# Patient Record
Sex: Female | Born: 1950 | Race: White | Hispanic: No | Marital: Single | Smoking: Never smoker
Health system: Southern US, Community
[De-identification: ages and names within clinical notes are randomized; demographics above are authoritative.]

## PROBLEM LIST (undated history)

## (undated) DIAGNOSIS — N289 Disorder of kidney and ureter, unspecified: Secondary | ICD-10-CM

## (undated) DIAGNOSIS — J45909 Unspecified asthma, uncomplicated: Secondary | ICD-10-CM

## (undated) DIAGNOSIS — I10 Essential (primary) hypertension: Secondary | ICD-10-CM

## (undated) HISTORY — PX: NEPHRECTOMY TRANSPLANTED ORGAN: SUR880

---

## 2011-02-12 ENCOUNTER — Other Ambulatory Visit (HOSPITAL_COMMUNITY): Payer: Self-pay | Admitting: Nephrology

## 2011-02-12 DIAGNOSIS — N186 End stage renal disease: Secondary | ICD-10-CM

## 2011-02-15 ENCOUNTER — Other Ambulatory Visit (HOSPITAL_COMMUNITY): Payer: Self-pay | Admitting: Nephrology

## 2011-02-15 ENCOUNTER — Ambulatory Visit (HOSPITAL_COMMUNITY)
Admission: RE | Admit: 2011-02-15 | Discharge: 2011-02-15 | Disposition: A | Discharge status: Home / Self Care | Payer: Non-veteran care | Source: Ambulatory Visit | Attending: Nephrology | Admitting: Nephrology

## 2011-02-15 DIAGNOSIS — N2581 Secondary hyperparathyroidism of renal origin: Secondary | ICD-10-CM | POA: Insufficient documentation

## 2011-02-15 DIAGNOSIS — N186 End stage renal disease: Secondary | ICD-10-CM | POA: Insufficient documentation

## 2011-02-15 DIAGNOSIS — I120 Hypertensive chronic kidney disease with stage 5 chronic kidney disease or end stage renal disease: Secondary | ICD-10-CM | POA: Insufficient documentation

## 2011-02-15 DIAGNOSIS — D649 Anemia, unspecified: Secondary | ICD-10-CM | POA: Insufficient documentation

## 2011-02-15 DIAGNOSIS — M109 Gout, unspecified: Secondary | ICD-10-CM | POA: Insufficient documentation

## 2011-02-15 DIAGNOSIS — Z01812 Encounter for preprocedural laboratory examination: Secondary | ICD-10-CM | POA: Insufficient documentation

## 2011-02-15 LAB — BASIC METABOLIC PANEL
BUN: 62 mg/dL — ABNORMAL HIGH (ref 6–23)
CO2: 24 mEq/L (ref 19–32)
Chloride: 105 mEq/L (ref 96–112)
Glucose, Bld: 99 mg/dL (ref 70–99)
Potassium: 4 mEq/L (ref 3.5–5.1)

## 2011-02-15 LAB — CBC
HCT: 31.3 % — ABNORMAL LOW (ref 36.0–46.0)
Hemoglobin: 10.5 g/dL — ABNORMAL LOW (ref 12.0–15.0)
WBC: 10.3 10*3/uL (ref 4.0–10.5)

## 2011-02-15 MED ORDER — IOHEXOL 300 MG/ML  SOLN
50.0000 mL | Freq: Once | INTRAMUSCULAR | Status: AC | PRN
  Administered 2011-02-15: 25 mL via INTRAVENOUS

## 2011-02-26 ENCOUNTER — Other Ambulatory Visit (HOSPITAL_COMMUNITY): Payer: Self-pay | Admitting: Nephrology

## 2011-02-26 DIAGNOSIS — N186 End stage renal disease: Secondary | ICD-10-CM

## 2011-03-02 ENCOUNTER — Other Ambulatory Visit (HOSPITAL_COMMUNITY): Payer: Self-pay | Admitting: Nephrology

## 2011-03-02 DIAGNOSIS — N186 End stage renal disease: Secondary | ICD-10-CM

## 2011-03-03 ENCOUNTER — Other Ambulatory Visit (HOSPITAL_COMMUNITY): Payer: Self-pay | Admitting: Nephrology

## 2011-03-03 DIAGNOSIS — N186 End stage renal disease: Secondary | ICD-10-CM

## 2011-03-15 ENCOUNTER — Ambulatory Visit (HOSPITAL_COMMUNITY): Payer: Self-pay

## 2011-03-22 ENCOUNTER — Ambulatory Visit (HOSPITAL_COMMUNITY)
Admission: RE | Admit: 2011-03-22 | Discharge: 2011-03-22 | Disposition: A | Discharge status: Home / Self Care | Payer: Non-veteran care | Source: Ambulatory Visit | Attending: Nephrology | Admitting: Nephrology

## 2011-03-22 DIAGNOSIS — T82898A Other specified complication of vascular prosthetic devices, implants and grafts, initial encounter: Secondary | ICD-10-CM | POA: Insufficient documentation

## 2011-03-22 DIAGNOSIS — N186 End stage renal disease: Secondary | ICD-10-CM | POA: Insufficient documentation

## 2011-03-22 DIAGNOSIS — Y832 Surgical operation with anastomosis, bypass or graft as the cause of abnormal reaction of the patient, or of later complication, without mention of misadventure at the time of the procedure: Secondary | ICD-10-CM | POA: Insufficient documentation

## 2011-03-22 MED ORDER — IOHEXOL 300 MG/ML  SOLN
100.0000 mL | Freq: Once | INTRAMUSCULAR | Status: AC | PRN
  Administered 2011-03-22: 21 mL via INTRAVENOUS

## 2015-03-12 ENCOUNTER — Encounter (HOSPITAL_COMMUNITY): Payer: Self-pay | Admitting: Emergency Medicine

## 2015-03-12 ENCOUNTER — Emergency Department (HOSPITAL_COMMUNITY): Payer: No Typology Code available for payment source

## 2015-03-12 ENCOUNTER — Emergency Department (HOSPITAL_COMMUNITY)
Admission: EM | Admit: 2015-03-12 | Discharge: 2015-03-12 | Disposition: A | Discharge status: Home / Self Care | Payer: No Typology Code available for payment source | Attending: Emergency Medicine | Admitting: Emergency Medicine

## 2015-03-12 DIAGNOSIS — I10 Essential (primary) hypertension: Secondary | ICD-10-CM | POA: Diagnosis not present

## 2015-03-12 DIAGNOSIS — J45909 Unspecified asthma, uncomplicated: Secondary | ICD-10-CM | POA: Diagnosis not present

## 2015-03-12 DIAGNOSIS — M5442 Lumbago with sciatica, left side: Secondary | ICD-10-CM

## 2015-03-12 DIAGNOSIS — Y998 Other external cause status: Secondary | ICD-10-CM | POA: Insufficient documentation

## 2015-03-12 DIAGNOSIS — S79912A Unspecified injury of left hip, initial encounter: Secondary | ICD-10-CM | POA: Insufficient documentation

## 2015-03-12 DIAGNOSIS — Z87448 Personal history of other diseases of urinary system: Secondary | ICD-10-CM | POA: Insufficient documentation

## 2015-03-12 DIAGNOSIS — Y92410 Unspecified street and highway as the place of occurrence of the external cause: Secondary | ICD-10-CM | POA: Insufficient documentation

## 2015-03-12 DIAGNOSIS — Z940 Kidney transplant status: Secondary | ICD-10-CM | POA: Diagnosis not present

## 2015-03-12 DIAGNOSIS — S79911A Unspecified injury of right hip, initial encounter: Secondary | ICD-10-CM | POA: Diagnosis not present

## 2015-03-12 DIAGNOSIS — Y9389 Activity, other specified: Secondary | ICD-10-CM | POA: Diagnosis not present

## 2015-03-12 DIAGNOSIS — M25552 Pain in left hip: Secondary | ICD-10-CM

## 2015-03-12 DIAGNOSIS — S3992XA Unspecified injury of lower back, initial encounter: Secondary | ICD-10-CM | POA: Diagnosis present

## 2015-03-12 DIAGNOSIS — M25551 Pain in right hip: Secondary | ICD-10-CM

## 2015-03-12 HISTORY — DX: Unspecified asthma, uncomplicated: J45.909

## 2015-03-12 HISTORY — DX: Disorder of kidney and ureter, unspecified: N28.9

## 2015-03-12 HISTORY — DX: Essential (primary) hypertension: I10

## 2015-03-12 LAB — I-STAT CHEM 8, ED
BUN: 9 mg/dL (ref 6–20)
CALCIUM ION: 1.16 mmol/L (ref 1.13–1.30)
CHLORIDE: 104 mmol/L (ref 101–111)
Creatinine, Ser: 0.9 mg/dL (ref 0.44–1.00)
GLUCOSE: 147 mg/dL — AB (ref 65–99)
HEMATOCRIT: 50 % — AB (ref 36.0–46.0)
Hemoglobin: 17 g/dL — ABNORMAL HIGH (ref 12.0–15.0)
POTASSIUM: 4.1 mmol/L (ref 3.5–5.1)
Sodium: 141 mmol/L (ref 135–145)
TCO2: 23 mmol/L (ref 0–100)

## 2015-03-12 MED ORDER — DIAZEPAM 5 MG PO TABS: 5.0000 mg | ORAL_TABLET | Freq: Two times a day (BID) | ORAL | Status: DC | PRN

## 2015-03-12 MED ORDER — HYDROCODONE-ACETAMINOPHEN 5-325 MG PO TABS: 2.0000 | ORAL_TABLET | ORAL | Status: DC | PRN

## 2015-03-12 NOTE — Discharge Instructions (Signed)
Back Pain, Adult °Low back pain is very common. About 1 in 5 people have back pain. The cause of low back pain is rarely dangerous. The pain often gets better over time. About half of people with a sudden onset of back pain feel better in just 2 weeks. About 8 in 10 people feel better by 6 weeks.  °CAUSES °Some common causes of back pain include: °· Strain of the muscles or ligaments supporting the spine. °· Wear and tear (degeneration) of the spinal discs. °· Arthritis. °· Direct injury to the back. °DIAGNOSIS °Most of the time, the direct cause of low back pain is not known. However, back pain can be treated effectively even when the exact cause of the pain is unknown. Answering your caregiver's questions about your overall health and symptoms is one of the most accurate ways to make sure the cause of your pain is not dangerous. If your caregiver needs more information, he or she may order lab work or imaging tests (X-rays or MRIs). However, even if imaging tests show changes in your back, this usually does not require surgery. °HOME CARE INSTRUCTIONS °For many people, back pain returns. Since low back pain is rarely dangerous, it is often a condition that people can learn to manage on their own.  °· Remain active. It is stressful on the back to sit or stand in one place. Do not sit, drive, or stand in one place for more than 30 minutes at a time. Take short walks on level surfaces as soon as pain allows. Try to increase the length of time you walk each day. °· Do not stay in bed. Resting more than 1 or 2 days can delay your recovery. °· Do not avoid exercise or work. Your body is made to move. It is not dangerous to be active, even though your back may hurt. Your back will likely heal faster if you return to being active before your pain is gone. °· Pay attention to your body when you  bend and lift. Many people have less discomfort when lifting if they bend their knees, keep the load close to their bodies, and  avoid twisting. Often, the most comfortable positions are those that put less stress on your recovering back. °· Find a comfortable position to sleep. Use a firm mattress and lie on your side with your knees slightly bent. If you lie on your back, put a pillow under your knees. °· Only take over-the-counter or prescription medicines as directed by your caregiver. Over-the-counter medicines to reduce pain and inflammation are often the most helpful. Your caregiver may prescribe muscle relaxant drugs. These medicines help dull your pain so you can more quickly return to your normal activities and healthy exercise. °· Put ice on the injured area. °· Put ice in a plastic bag. °· Place a towel between your skin and the bag. °· Leave the ice on for 15-20 minutes, 03-04 times a day for the first 2 to 3 days. After that, ice and heat may be alternated to reduce pain and spasms. °· Ask your caregiver about trying back exercises and gentle massage. This may be of some benefit. °· Avoid feeling anxious or stressed. Stress increases muscle tension and can worsen back pain. It is important to recognize when you are anxious or stressed and learn ways to manage it. Exercise is a great option. °SEEK MEDICAL CARE IF: °· You have pain that is not relieved with rest or medicine. °· You have pain that does not improve in 1 week. °· You have new symptoms. °· You are generally not feeling well. °SEEK   IMMEDIATE MEDICAL CARE IF:  °· You have pain that radiates from your back into your legs. °· You develop new bowel or bladder control problems. °· You have unusual weakness or numbness in your arms or legs. °· You develop nausea or vomiting. °· You develop abdominal pain. °· You feel faint. °Document Released: 08/23/2005 Document Revised: 02/22/2012 Document Reviewed: 12/25/2013 °ExitCare® Patient Information ©2015 [employer]. This information is not intended to replace advice given to you by your health care provider. Make sure you  discuss any questions you have with your health care provider. ° °Motor Vehicle Collision °It is common to have multiple bruises and sore muscles after a motor vehicle collision (MVC). These tend to feel worse for the first 24 hours. You may have the most stiffness and soreness over the first several hours. You may also feel worse when you wake up the first morning after your collision. After this point, you will usually begin to improve with each day. The speed of improvement often depends on the severity of the collision, the number of injuries, and the location and nature of these injuries. °HOME CARE INSTRUCTIONS °· Put ice on the injured area. °¨ Put ice in a plastic bag. °¨ Place a towel between your skin and the bag. °¨ Leave the ice on for 15-20 minutes, 3-4 times a day, or as directed by your health care provider. °· Drink enough fluids to keep your urine clear or pale yellow. Do not drink alcohol. °· Take a warm shower or bath once or twice a day. This will increase blood flow to sore muscles. °· You may return to activities as directed by your caregiver. Be careful when lifting, as this may aggravate neck or back pain. °· Only take over-the-counter or prescription medicines for pain, discomfort, or fever as directed by your caregiver. Do not use aspirin. This may increase bruising and bleeding. °SEEK IMMEDIATE MEDICAL CARE IF: °· You have numbness, tingling, or weakness in the arms or legs. °· You develop severe headaches not relieved with medicine. °· You have severe neck pain, especially tenderness in the middle of the back of your neck. °· You have changes in bowel or bladder control. °· There is increasing pain in any area of the body. °· You have shortness of breath, light-headedness, dizziness, or fainting. °· You have chest pain. °· You feel sick to your stomach (nauseous), throw up (vomit), or sweat. °· You have increasing abdominal discomfort. °· There is blood in your urine, stool, or  vomit. °· You have pain in your shoulder (shoulder strap areas). °· You feel your symptoms are getting worse. °MAKE SURE YOU: °· Understand these instructions. °· Will watch your condition. °· Will get help right away if you are not doing well or get worse. °Document Released: 08/23/2005 Document Revised: 01/07/2014 Document Reviewed: 01/20/2011 °ExitCare® Patient Information ©2015 [employer]. This information is not intended to replace advice given to you by your health care provider. Make sure you discuss any questions you have with your health care provider. ° °
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
## 2017-10-09 ENCOUNTER — Encounter (HOSPITAL_COMMUNITY): Payer: Self-pay

## 2017-10-09 ENCOUNTER — Emergency Department (HOSPITAL_COMMUNITY): Payer: Medicare Other

## 2017-10-09 ENCOUNTER — Inpatient Hospital Stay (HOSPITAL_COMMUNITY)
Admission: EM | Admit: 2017-10-09 | Discharge: 2017-10-12 | DRG: 091 | Disposition: A | Discharge status: Home / Self Care | Payer: Medicare Other | Attending: Internal Medicine | Admitting: Internal Medicine

## 2017-10-09 DIAGNOSIS — G92 Toxic encephalopathy: Principal | ICD-10-CM | POA: Diagnosis present

## 2017-10-09 DIAGNOSIS — I10 Essential (primary) hypertension: Secondary | ICD-10-CM | POA: Diagnosis present

## 2017-10-09 DIAGNOSIS — R6510 Systemic inflammatory response syndrome (SIRS) of non-infectious origin without acute organ dysfunction: Secondary | ICD-10-CM | POA: Diagnosis present

## 2017-10-09 DIAGNOSIS — R4182 Altered mental status, unspecified: Secondary | ICD-10-CM | POA: Diagnosis not present

## 2017-10-09 DIAGNOSIS — Z880 Allergy status to penicillin: Secondary | ICD-10-CM

## 2017-10-09 DIAGNOSIS — K2971 Gastritis, unspecified, with bleeding: Secondary | ICD-10-CM | POA: Diagnosis present

## 2017-10-09 DIAGNOSIS — Z940 Kidney transplant status: Secondary | ICD-10-CM

## 2017-10-09 DIAGNOSIS — F329 Major depressive disorder, single episode, unspecified: Secondary | ICD-10-CM | POA: Diagnosis present

## 2017-10-09 DIAGNOSIS — R410 Disorientation, unspecified: Secondary | ICD-10-CM

## 2017-10-09 DIAGNOSIS — J45909 Unspecified asthma, uncomplicated: Secondary | ICD-10-CM | POA: Diagnosis present

## 2017-10-09 DIAGNOSIS — K226 Gastro-esophageal laceration-hemorrhage syndrome: Secondary | ICD-10-CM | POA: Diagnosis present

## 2017-10-09 DIAGNOSIS — Z91041 Radiographic dye allergy status: Secondary | ICD-10-CM

## 2017-10-09 DIAGNOSIS — K920 Hematemesis: Secondary | ICD-10-CM | POA: Diagnosis present

## 2017-10-09 DIAGNOSIS — T402X5A Adverse effect of other opioids, initial encounter: Secondary | ICD-10-CM | POA: Diagnosis present

## 2017-10-09 DIAGNOSIS — F10239 Alcohol dependence with withdrawal, unspecified: Secondary | ICD-10-CM

## 2017-10-09 DIAGNOSIS — I4891 Unspecified atrial fibrillation: Secondary | ICD-10-CM | POA: Diagnosis present

## 2017-10-09 DIAGNOSIS — F1120 Opioid dependence, uncomplicated: Secondary | ICD-10-CM | POA: Diagnosis present

## 2017-10-09 DIAGNOSIS — G894 Chronic pain syndrome: Secondary | ICD-10-CM | POA: Diagnosis present

## 2017-10-09 DIAGNOSIS — F10939 Alcohol use, unspecified with withdrawal, unspecified: Secondary | ICD-10-CM

## 2017-10-09 DIAGNOSIS — E876 Hypokalemia: Secondary | ICD-10-CM | POA: Diagnosis present

## 2017-10-09 DIAGNOSIS — T450X5A Adverse effect of antiallergic and antiemetic drugs, initial encounter: Secondary | ICD-10-CM | POA: Diagnosis present

## 2017-10-09 DIAGNOSIS — T424X5A Adverse effect of benzodiazepines, initial encounter: Secondary | ICD-10-CM | POA: Diagnosis present

## 2017-10-09 DIAGNOSIS — G9340 Encephalopathy, unspecified: Secondary | ICD-10-CM | POA: Diagnosis present

## 2017-10-09 DIAGNOSIS — F419 Anxiety disorder, unspecified: Secondary | ICD-10-CM | POA: Diagnosis present

## 2017-10-09 DIAGNOSIS — Z79899 Other long term (current) drug therapy: Secondary | ICD-10-CM

## 2017-10-09 DIAGNOSIS — E860 Dehydration: Secondary | ICD-10-CM | POA: Diagnosis present

## 2017-10-09 DIAGNOSIS — Z886 Allergy status to analgesic agent status: Secondary | ICD-10-CM

## 2017-10-09 LAB — RAPID URINE DRUG SCREEN, HOSP PERFORMED
Amphetamines: NOT DETECTED
BARBITURATES: NOT DETECTED
Benzodiazepines: NOT DETECTED
COCAINE: NOT DETECTED
Opiates: NOT DETECTED
TETRAHYDROCANNABINOL: NOT DETECTED

## 2017-10-09 LAB — URINALYSIS, ROUTINE W REFLEX MICROSCOPIC
BACTERIA UA: NONE SEEN
Bilirubin Urine: NEGATIVE
GLUCOSE, UA: NEGATIVE mg/dL
HGB URINE DIPSTICK: NEGATIVE
KETONES UR: 80 mg/dL — AB
LEUKOCYTES UA: NEGATIVE
NITRITE: NEGATIVE
PROTEIN: 100 mg/dL — AB
Specific Gravity, Urine: 1.02 (ref 1.005–1.030)
Squamous Epithelial / LPF: NONE SEEN
pH: 5 (ref 5.0–8.0)

## 2017-10-09 LAB — I-STAT CG4 LACTIC ACID, ED
LACTIC ACID, VENOUS: 2.79 mmol/L — AB (ref 0.5–1.9)
LACTIC ACID, VENOUS: 2.81 mmol/L — AB (ref 0.5–1.9)

## 2017-10-09 LAB — CBC WITH DIFFERENTIAL/PLATELET
BASOS ABS: 0 10*3/uL (ref 0.0–0.1)
Basophils Relative: 0 %
EOS ABS: 0 10*3/uL (ref 0.0–0.7)
Eosinophils Relative: 0 %
HEMATOCRIT: 43.7 % (ref 36.0–46.0)
Hemoglobin: 15.4 g/dL — ABNORMAL HIGH (ref 12.0–15.0)
LYMPHS ABS: 1.8 10*3/uL (ref 0.7–4.0)
Lymphocytes Relative: 12 %
MCH: 31.4 pg (ref 26.0–34.0)
MCHC: 35.2 g/dL (ref 30.0–36.0)
MCV: 89 fL (ref 78.0–100.0)
MONOS PCT: 3 %
Monocytes Absolute: 0.4 10*3/uL (ref 0.1–1.0)
Neutro Abs: 12.5 10*3/uL — ABNORMAL HIGH (ref 1.7–7.7)
Neutrophils Relative %: 85 %
Platelets: 298 10*3/uL (ref 150–400)
RBC: 4.91 MIL/uL (ref 3.87–5.11)
RDW: 13.6 % (ref 11.5–15.5)
WBC: 14.7 10*3/uL — AB (ref 4.0–10.5)

## 2017-10-09 LAB — COMPREHENSIVE METABOLIC PANEL
ALBUMIN: 4.1 g/dL (ref 3.5–5.0)
ALT: 11 U/L — ABNORMAL LOW (ref 14–54)
ANION GAP: 16 — AB (ref 5–15)
AST: 45 U/L — AB (ref 15–41)
Alkaline Phosphatase: 53 U/L (ref 38–126)
BILIRUBIN TOTAL: 2.1 mg/dL — AB (ref 0.3–1.2)
BUN: 12 mg/dL (ref 6–20)
CHLORIDE: 103 mmol/L (ref 101–111)
CO2: 18 mmol/L — AB (ref 22–32)
Calcium: 9.8 mg/dL (ref 8.9–10.3)
Creatinine, Ser: 1 mg/dL (ref 0.44–1.00)
GFR calc Af Amer: >60 mL/min (ref >60)
GFR calc non Af Amer: 57 mL/min — ABNORMAL LOW (ref >60)
GLUCOSE: 134 mg/dL — AB (ref 65–99)
POTASSIUM: 5 mmol/L (ref 3.5–5.1)
SODIUM: 137 mmol/L (ref 135–145)
Total Protein: 6.6 g/dL (ref 6.5–8.1)

## 2017-10-09 LAB — OCCULT BLOOD GASTRIC / DUODENUM (SPECIMEN CUP): Occult Blood, Gastric: POSITIVE — AB

## 2017-10-09 LAB — ETHANOL

## 2017-10-09 LAB — AMMONIA: Ammonia: 35 umol/L (ref 9–35)

## 2017-10-09 LAB — I-STAT TROPONIN, ED: Troponin i, poc: 0.08 ng/mL (ref 0.00–0.08)

## 2017-10-09 MED ORDER — VANCOMYCIN HCL IN DEXTROSE 1-5 GM/200ML-% IV SOLN
1000.0000 mg | Freq: Once | INTRAVENOUS | Status: AC
  Administered 2017-10-09: 1000 mg via INTRAVENOUS
  Filled 2017-10-09: qty 200

## 2017-10-09 MED ORDER — ASPIRIN 81 MG PO CHEW
324.0000 mg | CHEWABLE_TABLET | Freq: Once | ORAL | Status: AC
  Administered 2017-10-09: 324 mg via ORAL
  Filled 2017-10-09: qty 4

## 2017-10-09 MED ORDER — ONDANSETRON HCL 4 MG/2ML IJ SOLN
4.0000 mg | Freq: Once | INTRAMUSCULAR | Status: AC
  Administered 2017-10-09: 4 mg via INTRAVENOUS
  Filled 2017-10-09: qty 2

## 2017-10-09 MED ORDER — LEVOFLOXACIN IN D5W 750 MG/150ML IV SOLN
750.0000 mg | Freq: Once | INTRAVENOUS | Status: AC
  Administered 2017-10-09: 750 mg via INTRAVENOUS
  Filled 2017-10-09: qty 150

## 2017-10-09 MED ORDER — PANTOPRAZOLE SODIUM 40 MG IV SOLR
40.0000 mg | Freq: Once | INTRAVENOUS | Status: AC
  Administered 2017-10-09: 40 mg via INTRAVENOUS
  Filled 2017-10-09: qty 40

## 2017-10-09 MED ORDER — BARIUM SULFATE 2.1 % PO SUSP
ORAL | Status: AC
  Filled 2017-10-09: qty 2

## 2017-10-09 MED ORDER — VITAMIN B-1 100 MG PO TABS
100.0000 mg | ORAL_TABLET | Freq: Every day | ORAL | Status: DC
  Administered 2017-10-10 – 2017-10-12 (×3): 100 mg via ORAL
  Filled 2017-10-09 (×3): qty 1

## 2017-10-09 MED ORDER — DILTIAZEM HCL-DEXTROSE 100-5 MG/100ML-% IV SOLN (PREMIX)
5.0000 mg/h | INTRAVENOUS | Status: DC
  Administered 2017-10-09: 5 mg/h via INTRAVENOUS
  Filled 2017-10-09 (×2): qty 100

## 2017-10-09 MED ORDER — LORAZEPAM 1 MG PO TABS: 0.0000 mg | ORAL_TABLET | Freq: Two times a day (BID) | ORAL | Status: DC

## 2017-10-09 MED ORDER — DILTIAZEM LOAD VIA INFUSION
15.0000 mg | Freq: Once | INTRAVENOUS | Status: AC
  Administered 2017-10-09: 15 mg via INTRAVENOUS
  Filled 2017-10-09: qty 15

## 2017-10-09 MED ORDER — THIAMINE HCL 100 MG/ML IJ SOLN: 100.0000 mg | Freq: Every day | INTRAMUSCULAR | Status: DC

## 2017-10-09 MED ORDER — DEXTROSE 5 % IV SOLN
2.0000 g | Freq: Once | INTRAVENOUS | Status: AC
  Administered 2017-10-09: 2 g via INTRAVENOUS
  Filled 2017-10-09: qty 2

## 2017-10-09 MED ORDER — LORAZEPAM 2 MG/ML IJ SOLN: 0.0000 mg | Freq: Two times a day (BID) | INTRAMUSCULAR | Status: DC

## 2017-10-09 MED ORDER — LORAZEPAM 1 MG PO TABS: 0.0000 mg | ORAL_TABLET | Freq: Four times a day (QID) | ORAL | Status: DC

## 2017-10-09 MED ORDER — LORAZEPAM 2 MG/ML IJ SOLN
0.0000 mg | Freq: Four times a day (QID) | INTRAMUSCULAR | Status: DC
  Administered 2017-10-09 – 2017-10-10 (×2): 2 mg via INTRAVENOUS
  Filled 2017-10-09 (×2): qty 1

## 2017-10-09 NOTE — ED Triage Notes (Signed)
Pt presents for evaluation of altered mental status and afib. Pt lives at apartment complex and went to office building. Staff at office building said she was not her normal self. States she does not feel well at all. Pt is only oriented to self. Pt on afib RVR.

## 2017-10-09 NOTE — ED Notes (Signed)
Patient transported to CT 

## 2017-10-09 NOTE — ED Provider Notes (Signed)
[facility] EMERGENCY [facility] Provider Note   CSN: [id number] Arrival date & time: 10/09/17  1431     History   Chief Complaint Chief Complaint  Patient presents with  . Altered Mental Status  . Atrial Fibrillation    HPI Benjaman LobeKathleen Pedone is a 67 y.o. female.  Patient lives at home alone. She went to the apartment office where she often goes to socialize and staff was concerned for AMS.    Altered Mental Status   This is a recurrent problem. Episode onset: unknown. Last confirmed known well was Tuesday. The problem has not changed since onset.Associated symptoms include confusion. Risk factors: reportedly uses etoh and narcotic pills. Past medical history comments: renal transplant.    Past Medical History:  Diagnosis Date  . Asthma   . Hypertension   . Renal disorder     There are no active problems to display for this patient.   Past Surgical History:  Procedure Laterality Date  . CESAREAN SECTION    . NEPHRECTOMY TRANSPLANTED ORGAN      OB History    No data available       Home Medications    Prior to Admission medications   Medication Sig Start Date End Date Taking? Authorizing Provider  diazepam (VALIUM) 5 MG tablet Take 1 tablet (5 mg total) by mouth every 12 (twelve) hours as needed for muscle spasms. 03/12/15   Antony MaduraHumes, Kelly, PA-C  HYDROcodone-acetaminophen (NORCO/VICODIN) 5-325 MG per tablet Take 2 tablets by mouth every 4 (four) hours as needed. 03/12/15   Antony MaduraHumes, Kelly, PA-C    Family History No family history on file.  Social History Social History   Tobacco Use  . Smoking status: Never Smoker  Substance Use Topics  . Alcohol use: No  . Drug use: No     Allergies   Patient has no known allergies.   Review of Systems Review of Systems  Unable to perform ROS: Mental status change  Psychiatric/Behavioral: Positive for confusion.     Physical Exam Updated Vital Signs BP 121/89   Pulse (!) 128   Temp 98.3 F  (36.8 C) (Oral)   Resp (!) 21   SpO2 100%   Physical Exam  Constitutional: She appears well-developed and well-nourished. No distress.  HENT:  Head: Normocephalic and atraumatic.  Eyes: Conjunctivae and EOM are normal. Pupils are equal, round, and reactive to light.  Neck: Neck supple.  Cardiovascular:  Tachycardic, irregular  Pulmonary/Chest: Effort normal and breath sounds normal. No respiratory distress.  Abdominal: Soft. There is no tenderness.  Musculoskeletal: She exhibits no edema.  Neurological: She is alert.  Oriented to person only  Skin: Skin is warm and dry.  Psychiatric: She has a normal mood and affect.  Nursing note and vitals reviewed.    ED Treatments / Results  Labs (all labs ordered are listed, but only abnormal results are displayed) Labs Reviewed  CBC WITH DIFFERENTIAL/PLATELET  COMPREHENSIVE METABOLIC PANEL  URINALYSIS, ROUTINE W REFLEX MICROSCOPIC  AMMONIA  RAPID URINE DRUG SCREEN, HOSP PERFORMED  PROTIME-INR  I-STAT TROPONIN, ED    EKG  EKG Interpretation  Date/Time:  Sunday October 09 2017 14:41:32 EST Ventricular Rate:  141 PR Interval:    QRS Duration: 90 QT Interval:  303 QTC Calculation: 464 R Axis:   29 Text Interpretation:  Atrial fibrillation Probable LVH with secondary repol abnrm No old tracing to compare Confirmed by Linwood DibblesKnapp, Jon [phone]([id number]) on 10/09/2017 2:58:35 PM       Radiology  No results found.  Procedures Procedures (including critical care time)  Medications Ordered in ED Medications  diltiazem (CARDIZEM) 1 mg/mL load via infusion 15 mg (not administered)    And  diltiazem (CARDIZEM) 100 mg in dextrose 5% (1 mg/mL) infusion (not administered)  aspirin chewable tablet 324 mg (not administered)     Initial Impression / Assessment and Plan / ED Course  I have reviewed the triage vital signs and the nursing notes.  Pertinent labs & imaging results that were available during my care of the patient were reviewed  by me and considered in my medical decision making (see chart for details).     Ms. Triplett is a 67 year old female with a past medical history significant for renal transplant, asthma, hypertension who presents for altered mental status and is found to be in atrial fibrillation.  EKG obtained demonstrates A. fib with LVH.   Cardizem drip started and improved HR; patient converted to NSR.  Chest x-ray obtained, personally reviewed by me, demonstrates no acute cardiac pulmonary processes.  Possible ptx noted, but felt unlikely.  Repeat CXR shows no ptx.  CT head obtained and demonstrates no acute intracranial abnormalities.  Labs obtained and significant for leukocytosis, borderline negative troponin, elevated anion gap with metabolic acidosis, elevated lactic acidosis.  Patient mental status improved and she expresses abdominal pain.  She also states that she has been drinking at least 4 drinks/day, none today.  Patient placed on CIWA.  CT abd/pelvis shows kidney atrophy, transplanted kidney, no evidence of acute intra-abdominal or pelvic abnormality.  Patient had one episode of emesis. Gastric occult positive.  Type and screen ordered. Protonix given.  Patient is admitted to hospitalist.  Final Clinical Impressions(s) / ED Diagnoses   Final diagnoses:  Confusion  Atrial fibrillation with RVR (HCC)  Hematemesis, presence of nausea not specified  Alcohol withdrawal syndrome with complication [facility])    ED Discharge Orders    None       Garey Ham, MD 10/10/17 4540    Blane Ohara, MD 10/10/17 [phone]

## 2017-10-09 NOTE — ED Notes (Signed)
CT notified pt ready for transport, per Dr. Jodi MourningZavitz.

## 2017-10-09 NOTE — ED Notes (Signed)
Pt able to use bedside toilet with one assist and observation, tolerated well.

## 2017-10-09 NOTE — ED Notes (Signed)
Pt c/o increased tremors in her hands, EDP aware.

## 2017-10-09 NOTE — ED Provider Notes (Signed)
MSE was initiated and I personally evaluated the patient and placed orders (if any) at  2:58 PM on October 09, 2017.  The patient appears stable so that the remainder of the MSE may be completed by another provider.  2:58 PM I was called to come into the room to medically screen patient.  Patient with a new set of A. fib RVR.  Patient apparently lives alone in an apartment building and went into the office like she normally does, and was found to be disoriented.  EMS was called.  EMS noticed A. fib RVR, given 10 of metoprolol, with no relief.  At this time patient is only able to tell me her name, unable to tell me where she is or the year, she is unable to say why she is here or how she got here.  Last seen normal is unknown.  I did speak with patient's daughter on the phone, Lisa RocaSuzanne Adams, [phone], who is coming from [place].  She advised me that patient does have history of kidney transplant and that she has had recently some problems with abuse of opiates and alcohol.  She last spoke with her about a week ago.  Basic labs placed, will get CT head, chest x-ray, will try Cardizem for A. fib RVR.   Jaynie CrumbleKirichenko, Francoise Chojnowski, PA-C 10/10/17 1615    Linwood DibblesKnapp, Jon, MD 10/10/17 [phone]

## 2017-10-09 NOTE — Progress Notes (Signed)
A consult was received from an ED physician for vancomycin, levaquin, and aztreonam per pharmacy dosing.  The patient's profile has been reviewed for ht/wt/allergies/indication/available labs.  A one time order has been placed for each antibiotic.  Further antibiotics/pharmacy consults should be ordered by admitting physician if indicated.                       Thank you, Fredrik RiggerMarkle, Kimbella Heisler Sue 10/09/2017  5:43 PM

## 2017-10-09 NOTE — ED Notes (Signed)
Dr.Zavitz shown results of lactic Acid. ED-Lab.

## 2017-10-10 ENCOUNTER — Encounter (HOSPITAL_COMMUNITY): Payer: Self-pay

## 2017-10-10 ENCOUNTER — Other Ambulatory Visit: Payer: Self-pay

## 2017-10-10 DIAGNOSIS — T424X5A Adverse effect of benzodiazepines, initial encounter: Secondary | ICD-10-CM | POA: Diagnosis present

## 2017-10-10 DIAGNOSIS — I4891 Unspecified atrial fibrillation: Secondary | ICD-10-CM

## 2017-10-10 DIAGNOSIS — E876 Hypokalemia: Secondary | ICD-10-CM

## 2017-10-10 DIAGNOSIS — F329 Major depressive disorder, single episode, unspecified: Secondary | ICD-10-CM | POA: Diagnosis present

## 2017-10-10 DIAGNOSIS — Z880 Allergy status to penicillin: Secondary | ICD-10-CM | POA: Diagnosis not present

## 2017-10-10 DIAGNOSIS — R4182 Altered mental status, unspecified: Secondary | ICD-10-CM | POA: Diagnosis present

## 2017-10-10 DIAGNOSIS — K226 Gastro-esophageal laceration-hemorrhage syndrome: Secondary | ICD-10-CM | POA: Diagnosis present

## 2017-10-10 DIAGNOSIS — G9340 Encephalopathy, unspecified: Secondary | ICD-10-CM | POA: Diagnosis not present

## 2017-10-10 DIAGNOSIS — K2971 Gastritis, unspecified, with bleeding: Secondary | ICD-10-CM | POA: Diagnosis present

## 2017-10-10 DIAGNOSIS — T450X5A Adverse effect of antiallergic and antiemetic drugs, initial encounter: Secondary | ICD-10-CM | POA: Diagnosis present

## 2017-10-10 DIAGNOSIS — Z940 Kidney transplant status: Secondary | ICD-10-CM | POA: Diagnosis not present

## 2017-10-10 DIAGNOSIS — F419 Anxiety disorder, unspecified: Secondary | ICD-10-CM | POA: Diagnosis present

## 2017-10-10 DIAGNOSIS — T402X5A Adverse effect of other opioids, initial encounter: Secondary | ICD-10-CM | POA: Diagnosis present

## 2017-10-10 DIAGNOSIS — J45909 Unspecified asthma, uncomplicated: Secondary | ICD-10-CM | POA: Diagnosis present

## 2017-10-10 DIAGNOSIS — G92 Toxic encephalopathy: Secondary | ICD-10-CM | POA: Diagnosis present

## 2017-10-10 DIAGNOSIS — R6510 Systemic inflammatory response syndrome (SIRS) of non-infectious origin without acute organ dysfunction: Secondary | ICD-10-CM | POA: Diagnosis present

## 2017-10-10 DIAGNOSIS — Z79899 Other long term (current) drug therapy: Secondary | ICD-10-CM | POA: Diagnosis not present

## 2017-10-10 DIAGNOSIS — G894 Chronic pain syndrome: Secondary | ICD-10-CM | POA: Diagnosis present

## 2017-10-10 DIAGNOSIS — F1120 Opioid dependence, uncomplicated: Secondary | ICD-10-CM | POA: Diagnosis present

## 2017-10-10 DIAGNOSIS — Z886 Allergy status to analgesic agent status: Secondary | ICD-10-CM | POA: Diagnosis not present

## 2017-10-10 DIAGNOSIS — K920 Hematemesis: Secondary | ICD-10-CM

## 2017-10-10 DIAGNOSIS — Z91041 Radiographic dye allergy status: Secondary | ICD-10-CM | POA: Diagnosis not present

## 2017-10-10 DIAGNOSIS — E860 Dehydration: Secondary | ICD-10-CM | POA: Diagnosis present

## 2017-10-10 DIAGNOSIS — I10 Essential (primary) hypertension: Secondary | ICD-10-CM | POA: Diagnosis present

## 2017-10-10 LAB — CBC WITH DIFFERENTIAL/PLATELET
BASOS ABS: 0 10*3/uL (ref 0.0–0.1)
Basophils Relative: 0 %
Eosinophils Absolute: 0 10*3/uL (ref 0.0–0.7)
Eosinophils Relative: 0 %
HCT: 40.2 % (ref 36.0–46.0)
Hemoglobin: 14.2 g/dL (ref 12.0–15.0)
LYMPHS ABS: 1.3 10*3/uL (ref 0.7–4.0)
Lymphocytes Relative: 11 %
MCH: 31.5 pg (ref 26.0–34.0)
MCHC: 35.3 g/dL (ref 30.0–36.0)
MCV: 89.1 fL (ref 78.0–100.0)
MONO ABS: 0.7 10*3/uL (ref 0.1–1.0)
MONOS PCT: 6 %
Neutro Abs: 9.9 10*3/uL — ABNORMAL HIGH (ref 1.7–7.7)
Neutrophils Relative %: 83 %
Platelets: 234 10*3/uL (ref 150–400)
RBC: 4.51 MIL/uL (ref 3.87–5.11)
RDW: 13.6 % (ref 11.5–15.5)
WBC: 11.9 10*3/uL — AB (ref 4.0–10.5)

## 2017-10-10 LAB — BASIC METABOLIC PANEL
Anion gap: 13 (ref 5–15)
BUN: 12 mg/dL (ref 6–20)
CALCIUM: 9.7 mg/dL (ref 8.9–10.3)
CO2: 20 mmol/L — AB (ref 22–32)
CREATININE: 0.87 mg/dL (ref 0.44–1.00)
Chloride: 107 mmol/L (ref 101–111)
GFR calc Af Amer: >60 mL/min (ref >60)
GFR calc non Af Amer: >60 mL/min (ref >60)
GLUCOSE: 120 mg/dL — AB (ref 65–99)
Potassium: 3.1 mmol/L — ABNORMAL LOW (ref 3.5–5.1)
Sodium: 140 mmol/L (ref 135–145)

## 2017-10-10 LAB — ACETAMINOPHEN LEVEL

## 2017-10-10 LAB — SALICYLATE LEVEL: Salicylate Lvl: <7 mg/dL (ref 2.8–30.0)

## 2017-10-10 LAB — TYPE AND SCREEN
ABO/RH(D): A NEG
Antibody Screen: NEGATIVE

## 2017-10-10 LAB — MAGNESIUM: Magnesium: 1.5 mg/dL — ABNORMAL LOW (ref 1.7–2.4)

## 2017-10-10 LAB — ABO/RH: ABO/RH(D): A NEG

## 2017-10-10 LAB — LACTIC ACID, PLASMA: LACTIC ACID, VENOUS: 1 mmol/L (ref 0.5–1.9)

## 2017-10-10 LAB — TSH: TSH: 0.845 u[IU]/mL (ref 0.350–4.500)

## 2017-10-10 LAB — PROTIME-INR
INR: 1.33
Prothrombin Time: 16.4 seconds — ABNORMAL HIGH (ref 11.4–15.2)

## 2017-10-10 LAB — PROCALCITONIN: Procalcitonin: 0.17 ng/mL

## 2017-10-10 MED ORDER — ONDANSETRON HCL 4 MG PO TABS: 4.0000 mg | ORAL_TABLET | Freq: Four times a day (QID) | ORAL | Status: DC | PRN

## 2017-10-10 MED ORDER — TACROLIMUS 1 MG PO CAPS
2.0000 mg | ORAL_CAPSULE | Freq: Every morning | ORAL | Status: DC
  Administered 2017-10-11 – 2017-10-12 (×2): 2 mg via ORAL
  Filled 2017-10-10 (×2): qty 2

## 2017-10-10 MED ORDER — MONTELUKAST SODIUM 10 MG PO TABS
10.0000 mg | ORAL_TABLET | Freq: Every day | ORAL | Status: DC
  Administered 2017-10-10 – 2017-10-11 (×2): 10 mg via ORAL
  Filled 2017-10-10 (×2): qty 1

## 2017-10-10 MED ORDER — ACETAMINOPHEN 650 MG RE SUPP: 650.0000 mg | Freq: Four times a day (QID) | RECTAL | Status: DC | PRN

## 2017-10-10 MED ORDER — DICYCLOMINE HCL 20 MG PO TABS: 20.0000 mg | ORAL_TABLET | Freq: Four times a day (QID) | ORAL | Status: DC | PRN

## 2017-10-10 MED ORDER — ONDANSETRON HCL 4 MG/2ML IJ SOLN: 4.0000 mg | Freq: Four times a day (QID) | INTRAMUSCULAR | Status: DC | PRN

## 2017-10-10 MED ORDER — LORAZEPAM 1 MG PO TABS
0.0000 mg | ORAL_TABLET | Freq: Four times a day (QID) | ORAL | Status: DC | PRN
  Administered 2017-10-11: 2 mg via ORAL
  Filled 2017-10-10 (×2): qty 2

## 2017-10-10 MED ORDER — BUPROPION HCL ER (XL) 150 MG PO TB24
300.0000 mg | ORAL_TABLET | Freq: Every day | ORAL | Status: DC
  Administered 2017-10-10 – 2017-10-12 (×3): 300 mg via ORAL
  Filled 2017-10-10 (×3): qty 2

## 2017-10-10 MED ORDER — ATENOLOL 50 MG PO TABS
75.0000 mg | ORAL_TABLET | Freq: Two times a day (BID) | ORAL | Status: DC
  Administered 2017-10-10 – 2017-10-12 (×4): 75 mg via ORAL
  Filled 2017-10-10: qty 1
  Filled 2017-10-10 (×2): qty 2
  Filled 2017-10-10: qty 1

## 2017-10-10 MED ORDER — MYCOPHENOLATE MOFETIL 250 MG PO CAPS
500.0000 mg | ORAL_CAPSULE | Freq: Two times a day (BID) | ORAL | Status: DC
  Administered 2017-10-10 – 2017-10-12 (×4): 500 mg via ORAL
  Filled 2017-10-10 (×4): qty 2

## 2017-10-10 MED ORDER — ACETAMINOPHEN 325 MG PO TABS
650.0000 mg | ORAL_TABLET | Freq: Four times a day (QID) | ORAL | Status: DC | PRN
  Administered 2017-10-10 – 2017-10-11 (×2): 650 mg via ORAL
  Filled 2017-10-10 (×2): qty 2

## 2017-10-10 MED ORDER — CLONIDINE HCL 0.1 MG PO TABS
0.1000 mg | ORAL_TABLET | Freq: Four times a day (QID) | ORAL | Status: AC
  Administered 2017-10-10 – 2017-10-11 (×7): 0.1 mg via ORAL
  Filled 2017-10-10 (×10): qty 1

## 2017-10-10 MED ORDER — HYDROXYZINE HCL 25 MG PO TABS
25.0000 mg | ORAL_TABLET | Freq: Four times a day (QID) | ORAL | Status: DC | PRN
  Administered 2017-10-10 – 2017-10-11 (×2): 25 mg via ORAL
  Filled 2017-10-10 (×2): qty 1

## 2017-10-10 MED ORDER — LOPERAMIDE HCL 2 MG PO CAPS: 2.0000 mg | ORAL_CAPSULE | ORAL | Status: DC | PRN

## 2017-10-10 MED ORDER — METHOCARBAMOL 500 MG PO TABS: 500.0000 mg | ORAL_TABLET | Freq: Three times a day (TID) | ORAL | Status: DC | PRN

## 2017-10-10 MED ORDER — POTASSIUM CHLORIDE 10 MEQ/100ML IV SOLN
10.0000 meq | INTRAVENOUS | Status: AC
  Administered 2017-10-10 (×3): 10 meq via INTRAVENOUS
  Filled 2017-10-10 (×4): qty 100

## 2017-10-10 MED ORDER — IPRATROPIUM-ALBUTEROL 0.5-2.5 (3) MG/3ML IN SOLN: 3.0000 mL | RESPIRATORY_TRACT | Status: DC | PRN

## 2017-10-10 MED ORDER — LORAZEPAM 1 MG PO TABS: 1.0000 mg | ORAL_TABLET | Freq: Three times a day (TID) | ORAL | Status: DC

## 2017-10-10 MED ORDER — AMLODIPINE BESYLATE 5 MG PO TABS
5.0000 mg | ORAL_TABLET | Freq: Every day | ORAL | Status: DC
  Administered 2017-10-10 – 2017-10-12 (×3): 5 mg via ORAL
  Filled 2017-10-10 (×3): qty 1

## 2017-10-10 MED ORDER — DEXTROSE 5 % IV SOLN
1.0000 g | Freq: Three times a day (TID) | INTRAVENOUS | Status: DC
  Administered 2017-10-10: 1 g via INTRAVENOUS
  Filled 2017-10-10 (×2): qty 1

## 2017-10-10 MED ORDER — SODIUM CHLORIDE 0.9 % IV SOLN
INTRAVENOUS | Status: DC
  Administered 2017-10-10 – 2017-10-11 (×3): via INTRAVENOUS

## 2017-10-10 MED ORDER — TACROLIMUS 1 MG PO CAPS
1.0000 mg | ORAL_CAPSULE | Freq: Every evening | ORAL | Status: DC
  Administered 2017-10-10 – 2017-10-11 (×2): 1 mg via ORAL
  Filled 2017-10-10 (×2): qty 1

## 2017-10-10 MED ORDER — PANTOPRAZOLE SODIUM 40 MG IV SOLR
40.0000 mg | Freq: Two times a day (BID) | INTRAVENOUS | Status: DC
  Administered 2017-10-10 – 2017-10-12 (×5): 40 mg via INTRAVENOUS
  Filled 2017-10-10 (×5): qty 40

## 2017-10-10 MED ORDER — CLONIDINE HCL 0.1 MG PO TABS: 0.1000 mg | ORAL_TABLET | Freq: Every day | ORAL | Status: DC

## 2017-10-10 MED ORDER — POTASSIUM CHLORIDE 10 MEQ/100ML IV SOLN
10.0000 meq | Freq: Once | INTRAVENOUS | Status: AC
  Administered 2017-10-10: 10 meq via INTRAVENOUS
  Filled 2017-10-10: qty 100

## 2017-10-10 MED ORDER — LORAZEPAM 2 MG/ML IJ SOLN
0.0000 mg | Freq: Four times a day (QID) | INTRAMUSCULAR | Status: DC | PRN
  Administered 2017-10-10: 2 mg via INTRAVENOUS
  Administered 2017-10-10: 1 mg via INTRAVENOUS
  Filled 2017-10-10 (×2): qty 1

## 2017-10-10 MED ORDER — TRAZODONE HCL 50 MG PO TABS
150.0000 mg | ORAL_TABLET | Freq: Every day | ORAL | Status: DC
  Administered 2017-10-10 – 2017-10-11 (×2): 150 mg via ORAL
  Filled 2017-10-10 (×2): qty 1

## 2017-10-10 MED ORDER — LEVOFLOXACIN IN D5W 750 MG/150ML IV SOLN: 750.0000 mg | INTRAVENOUS | Status: DC

## 2017-10-10 MED ORDER — MAGNESIUM SULFATE 2 GM/50ML IV SOLN
2.0000 g | Freq: Once | INTRAVENOUS | Status: AC
  Administered 2017-10-10: 2 g via INTRAVENOUS
  Filled 2017-10-10: qty 50

## 2017-10-10 MED ORDER — CLONIDINE HCL 0.1 MG PO TABS
0.1000 mg | ORAL_TABLET | ORAL | Status: DC
  Administered 2017-10-12: 0.1 mg via ORAL

## 2017-10-10 MED ORDER — VANCOMYCIN HCL IN DEXTROSE 1-5 GM/200ML-% IV SOLN
1000.0000 mg | Freq: Two times a day (BID) | INTRAVENOUS | Status: DC
  Administered 2017-10-10: 1000 mg via INTRAVENOUS
  Filled 2017-10-10: qty 200

## 2017-10-10 NOTE — ED Notes (Signed)
Attempted to call report

## 2017-10-10 NOTE — ED Notes (Signed)
PAGED ADMITTING PER RN  

## 2017-10-10 NOTE — ED Notes (Signed)
Pt remains anxious, admitting MD paged re: COWS score and next due ativan based on the score, pt reassured verbally, updated on plan of care

## 2017-10-10 NOTE — ED Notes (Signed)
Pt becomes confused and tries to get out of bed. Pt states she is lost. Pt becomes tearful . Pt is redirected and consoled . Again instructed in use of call bell.

## 2017-10-10 NOTE — ED Notes (Signed)
Autumn RN notified pharmacy re: infiltration, per report from RN plan to alternate heat & ice to the area

## 2017-10-10 NOTE — ED Notes (Signed)
ORDERED DIET TRAY FOR PT  

## 2017-10-10 NOTE — Progress Notes (Signed)
CCMD notified RN of 18 beat run of SVT (HR 150s-167). Notified on-call provider, Donnamarie PoagK. Kirby, NP. Pt in NAD, will continue to monitor.  Caswell Corwinana C Malerie Eakins, RN 10/10/17 9:18 PM

## 2017-10-10 NOTE — ED Notes (Signed)
Admitting provider at bedside.

## 2017-10-10 NOTE — ED Notes (Signed)
Pt had 1 episode of vomiting. EDP aware.

## 2017-10-10 NOTE — H&P (Addendum)
History and Physical    Kyera Felan ZOX:[id number] DOB: 14-Dec-1950 DOA: 10/09/2017  Referring MD/NP/PA: Penne Lash (resident) PCP: Patient, No Pcp Per  Patient coming from: via ems   Chief Complaint: Altered mental status  I have personally briefly reviewed patient's old medical records in [facility]   HPI: Anna [name] is a 67 y.o. 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  At baseline patient lives alone.  History is obtained from review of records as well as the patient.  The patient had been complaining of abdominal pain with nausea and vomiting.  Reports associated symptoms of dark stools over the last few days and shortness of breath.  It appears that she recently was taken off of medications and daughter was reported to have said that she thinks that she was withdrawing from opiates and barbiturates possibly.  Patient also admits to alcohol use, but is on clear how much that she drinks on a daily basis.  ED Course: Patient was noted to have one episode of emesis while in the ED that was found to be occult positive.  Is also noted to have been found in A. fib with RVR for which she was given 10 mg of metoprolol without relief and later placed on a diltiazem drip.  Thereafter converted to normal sinus rhythm and the diltiazem drip was placed on pause. Labs revealed WBC 14.7, lactic acid 2.81, UDS was negative. Patient's alcohol, acetaminophen, and salicylate levels were all within normal limits.  TRH called to admit.  Review of Systems  Unable to perform ROS: Mental status change  Respiratory: Positive for shortness of breath.   Gastrointestinal: Positive for melena, nausea and vomiting.    Past Medical History:  Diagnosis Date  . Asthma   . Hypertension   . Renal disorder     Past Surgical  History:  Procedure Laterality Date  . CESAREAN SECTION    . NEPHRECTOMY TRANSPLANTED ORGAN       reports that  has never smoked. She does not have any smokeless tobacco history on file. She reports that she does not drink alcohol or use drugs.  Allergies  Allergen Reactions  . Iodine-131 Hives and Shortness Of Breath  . 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 Penicillin G Rash    Has patient had a PCN reaction causing immediate rash, facial/tongue/throat swelling, SOB or lightheadedness with hypotension: Yes Has patient had a PCN reaction causing severe rash involving mucus membranes or skin necrosis: Unk Has patient had a PCN reaction that required hospitalization: Unk Has patient had a PCN reaction occurring within the last 10 years: No If all of the above answers are "NO", then may proceed with Cephalosporin use.     No family history on file.  Prior to Admission medications   Medication Sig Start Date End Date Taking? Authorizing Provider  Albuterol Sulfate 108 (90 Base) MCG/ACT AEPB Inhale 2 puffs into the lungs 4 (four) times daily as needed (for wheezing or shortness of breath).    Yes [provider]  amLODipine (NORVASC) 5 MG tablet Take 5 mg by mouth daily.   Yes [provider]  atenolol (TENORMIN) 25 MG tablet Take 75 mg by mouth 2 (two) times daily.   Yes [provider]  buPROPion (WELLBUTRIN XL) 300  MG 24 hr tablet Take 300 mg by mouth daily.   Yes [provider]  Carboxymethylcellulose Sod PF 0.5 % SOLN Place 1 drop into both eyes 4 (four) times daily as needed (for irritation).   Yes [provider]  Cholecalciferol (VITAMIN D-3) 1000 units CAPS Take 1,000 Units by mouth daily.   Yes [provider]  fluticasone (FLONASE) 50 MCG/ACT nasal spray Place 1 spray into both nostrils daily.   Yes [provider]  gabapentin (NEURONTIN) 100 MG capsule Take 100 mg by mouth 3  (three) times daily.   Yes [provider]  ibuprofen (ADVIL,MOTRIN) 200 MG tablet Take 200-800 mg by mouth every 6 (six) hours as needed (for pain or headaches).   Yes [provider]  ipratropium (ATROVENT) 0.03 % nasal spray Place 2 sprays into both nostrils 3 (three) times daily as needed for rhinitis.   Yes [provider]  lidocaine (LIDODERM) 5 % Place 1 patch onto the skin daily. Remove & Discard patch within 12 hours or as directed by MD   Yes [provider]  LORazepam (ATIVAN) 1 MG tablet Take 1 mg by mouth 3 (three) times daily.   Yes [provider]  methocarbamol (ROBAXIN) 500 MG tablet Take 1,000 mg by mouth 2 (two) times daily as needed for muscle spasms.   Yes [provider]  montelukast (SINGULAIR) 10 MG tablet Take 10 mg by mouth at bedtime.   Yes [provider]  mycophenolate (CELLCEPT) 500 MG tablet Take 500 mg by mouth 2 (two) times daily.   Yes [provider]  naloxone (NARCAN) nasal spray 4 mg/0.1 mL Place 1 spray into the nose once as needed (for accidental overdose).   Yes [provider]  Omega-3 Fatty Acids (FISH OIL) 1000 MG CAPS Take 1,000 mg by mouth daily.    Yes [provider]  oxyCODONE-acetaminophen (PERCOCET/ROXICET) 5-325 MG tablet Take 1 tablet by mouth See admin instructions. 1 tablet by mouth two times a day for one week then 1 tablet once a day for one week then stop   Yes [provider]  tacrolimus (PROGRAF) 1 MG capsule Take 1-2 mg by mouth See admin instructions. 2 mg by mouth in the morning and 1 mg in the evening   Yes [provider]  traZODone (DESYREL) 150 MG tablet Take 150 mg by mouth at bedtime.   Yes [provider]  diazepam (VALIUM) 5 MG tablet Take 1 tablet (5 mg total) by mouth every 12 (twelve) hours as needed for muscle spasms. Patient not taking: Reported on 10/09/2017 03/12/15   Antony Madura, PA-C  HYDROcodone-acetaminophen  (NORCO/VICODIN) 5-325 MG per tablet Take 2 tablets by mouth every 4 (four) hours as needed. Patient not taking: Reported on 10/09/2017 03/12/15   Antony Madura, PA-C    Physical Exam:  Constitutional: Altered elderly female who is tremulous Vitals:   10/09/17 2300 10/09/17 2315 10/09/17 2345 10/10/17 0000  BP: 132/63 (!) 108/52 138/77 112/67  Pulse:    88  Resp: 18 (!) 21 (!) 28 19  Temp:      TempSrc:      SpO2:    97%   Eyes: PERRL, lids and conjunctivae normal ENMT: Mucous membranes are dry. Posterior pharynx clear of any exudate or lesions.  Neck: normal, supple, no masses, no thyromegaly Respiratory: Tachypneic, but clear to auscultation bilaterally, no wheezing, no crackles.  Cardiovascular: Tachycardic, no murmurs / rubs / gallops. No extremity edema. 2+ pedal  pulses. No carotid bruits.  Abdomen: no tenderness, no masses palpated. No hepatosplenomegaly. Bowel sounds positive.  Musculoskeletal: no clubbing / cyanosis. No joint deformity upper and lower extremities. Good ROM, no contractures. Normal muscle tone.  Skin: no rashes, lesions, ulcers. No induration Neurologic: CN 2-12 grossly intact. Sensation intact, DTR normal. Strength 5/5 in all 4.  Psychiatric:   Alert and oriented x 1.  Anxious mood.     Labs on Admission: I have personally reviewed following labs and imaging studies  CBC: Recent Labs  Lab 10/09/17 1447  WBC 14.7*  NEUTROABS 12.5*  HGB 15.4*  HCT 43.7  MCV 89.0  PLT 298   Basic Metabolic Panel: Recent Labs  Lab 10/09/17 1447  NA 137  K 5.0  CL 103  CO2 18*  GLUCOSE 134*  BUN 12  CREATININE 1.00  CALCIUM 9.8   GFR: CrCl cannot be calculated (Unknown ideal weight.). Liver Function Tests: Recent Labs  Lab 10/09/17 1447  AST 45*  ALT 11*  ALKPHOS 53  BILITOT 2.1*  PROT 6.6  ALBUMIN 4.1   No results for input(s): LIPASE, AMYLASE in the last 168 hours. Recent Labs  Lab 10/09/17 1449  AMMONIA 35   Coagulation Profile: No results  for input(s): INR, PROTIME in the last 168 hours. Cardiac Enzymes: No results for input(s): CKTOTAL, CKMB, CKMBINDEX, TROPONINI in the last 168 hours. BNP (last 3 results) No results for input(s): PROBNP in the last 8760 hours. HbA1C: No results for input(s): HGBA1C in the last 72 hours. CBG: No results for input(s): GLUCAP in the last 168 hours. Lipid Profile: No results for input(s): CHOL, HDL, LDLCALC, TRIG, CHOLHDL, LDLDIRECT in the last 72 hours. Thyroid Function Tests: No results for input(s): TSH, T4TOTAL, FREET4, T3FREE, THYROIDAB in the last 72 hours. Anemia Panel: No results for input(s): VITAMINB12, FOLATE, FERRITIN, TIBC, IRON, RETICCTPCT in the last 72 hours. Urine analysis:    Component Value Date/Time   COLORURINE YELLOW 10/09/2017 1449   APPEARANCEUR CLEAR 10/09/2017 1449   LABSPEC 1.020 10/09/2017 1449   PHURINE 5.0 10/09/2017 1449   GLUCOSEU NEGATIVE 10/09/2017 1449   HGBUR NEGATIVE 10/09/2017 1449   BILIRUBINUR NEGATIVE 10/09/2017 1449   KETONESUR 80 (A) 10/09/2017 1449   PROTEINUR 100 (A) 10/09/2017 1449   NITRITE NEGATIVE 10/09/2017 1449   LEUKOCYTESUR NEGATIVE 10/09/2017 1449   Sepsis Labs: No results found for this or any previous visit (from the past 240 hour(s)).   Radiological Exams on Admission: Ct Abdomen Pelvis Wo Contrast  Result Date: 10/09/2017 CLINICAL DATA:  Altered mental status EXAM: CT ABDOMEN AND PELVIS WITHOUT CONTRAST TECHNIQUE: Multidetector CT imaging of the abdomen and pelvis was performed following the standard protocol without IV contrast. COMPARISON:  03/12/2015 FINDINGS: Lower chest: Lung bases demonstrate no acute consolidation or pleural effusion. Heart size is normal Hepatobiliary: Focal steatosis near the falciform ligament. No calcified gallstones or biliary dilatation Pancreas: Unremarkable. No pancreatic ductal dilatation or surrounding inflammatory changes. Spleen: Normal in size without focal abnormality. Adrenals/Urinary  Tract: Adrenal glands are within normal limits. Atrophic kidneys. No hydronephrosis. The bladder is normal. Similar appearance of right lower quadrant transplanted kidney with minimal pelvocaliectasis. Stomach/Bowel: Stomach is within normal limits. Appendix appears normal. No evidence of bowel wall thickening, distention, or inflammatory changes. Sigmoid colon diverticular disease without acute inflammation Vascular/Lymphatic: Mild to moderate aortic atherosclerosis. No aneurysmal dilatation. No significantly enlarged lymph nodes Reproductive: Uterus and bilateral adnexa are unremarkable. Other: Negative for free air or free fluid. Musculoskeletal: Posterior stabilization rods  and fixating screws at L4 and L5, new since 2016 CT. Protrusion of the tip of the right fixating screw at L5 beyond the anterior vertebral margin. IMPRESSION: 1. No CT evidence for acute intra-abdominal or pelvic abnormality 2. Significant atrophy of the native kidneys. No hydronephrosis. Grossly stable appearance of right lower quadrant transplanted kidney 3. Sigmoid colon diverticular disease without acute inflammation 4. Interval posterior stabilization rods and fixating screws at L4 and L5. Tip of the right fixating screw at L5 extends about 7 mm anterior to the vertebral body. Electronically Signed   By: Jasmine PangKim  Fujinaga M.D.   On: 10/09/2017 21:31   Dg Chest 2 View  Result Date: 10/09/2017 CLINICAL DATA:  Pt presents for evaluation of altered mental status and afib. Pt lives at apartment complex and went to office building. Staff at office building said she was not her normal self EXAM: CHEST  2 VIEW COMPARISON:  To 11/2017 FINDINGS: Heart size is upper normal. The lungs are free of focal consolidations and pleural effusions. No pulmonary edema. IMPRESSION: No active cardiopulmonary disease. Electronically Signed   By: Norva PavlovElizabeth  Brown M.D.   On: 10/09/2017 19:15   Ct Head Wo Contrast  Result Date: 10/09/2017 CLINICAL DATA:  Altered  mental status.  Atrial fibrillation. EXAM: CT HEAD WITHOUT CONTRAST TECHNIQUE: Contiguous axial images were obtained from the base of the skull through the vertex without intravenous contrast. COMPARISON:  None. FINDINGS: Brain: There is no evidence of acute infarct, intracranial hemorrhage, mass, midline shift, or extra-axial fluid collection. Mild cerebral atrophy is within normal limits for age. A cavum septum pellucidum et vergae is incidentally noted. Vascular: Minimal carotid siphon atherosclerosis. No hyperdense vessel. Skull: No fracture or focal osseous lesion. Sinuses/Orbits: Chronic left maxillary sinusitis. Postsurgical changes involving the maxillary sinuses bilaterally. Clear mastoid air cells. Unremarkable orbits. Other: None. IMPRESSION: No evidence of acute intracranial abnormality. Electronically Signed   By: Sebastian AcheAllen  Grady M.D.   On: 10/09/2017 15:52   Dg Chest Portable 1 View  Result Date: 10/09/2017 CLINICAL DATA:  Altered mental status and atrial fibrillation. EXAM: PORTABLE CHEST 1 VIEW COMPARISON:  02/15/2011 FINDINGS: Right IJ dialysis catheter is no longer present. Heart is mildly enlarged in part from the AP projection with tortuous atherosclerotic aorta. Curvilinear pleural reflection representing a small left apical pneumothorax or skin fold artifact simulating a left apical pneumothorax. There appear to be some pulmonary vascular markings beyond this reflection more suggestive of a skin fold artifact. Would recommend either repeat imaging or chest CT for further correlation. No pneumonic consolidation, CHF nor effusion. No acute osseous abnormality. IMPRESSION: 1. Curvilinear density overlying the left upper lobe and left lung apex possibly representing a pleural reflection and small apical pneumothorax versus more likely a skin fold artifact as there appear to be some pulmonary markings beyond this curvilinear density. Would recommend repeat chest radiograph and/or chest CT for  further correlation. These results were called by telephone at the time of interpretation on 10/09/2017 at 4:23 pm to Dr. Blane OharaJOSHUA ZAVITZ , who verbally acknowledged these results. 2. Mildly enlarged cardiac silhouette with minimal aortic atherosclerosis. Electronically Signed   By: Tollie Ethavid  Kwon M.D.   On: 10/09/2017 16:23    EKG: Independently reviewed.  Sinus tachycardia with premature apical complexes.  Assessment/Plan SIRS/sepsis of unclear source : Patient was initially found to be tachycardic, tachypneic with white blood cell count of 14.7, and initial lactic acid elevated at 2.81.  Sepsis protocol was initiated.  Chest x-ray was otherwise noted to  be clear and urine was negative for any signs of infection. - Admit to a stepdown bed - Follow-up blood cultures - Continue empiric antibiotics of vancomycin, Levaquin, aztreonam  Acute metabolic encephalopathy: CT scan of the brain showed no acute abnormalities. Patient was noted to have negative UDS, alcohol, salicylate, and acetaminophen levels.  Noted to be possibly withdrawing from recently being taken off opiates - N.p.o. - Neuro checks - COWS order set initiated with clonidine  Alcohol abuse - CWIA protocols - Need further counseling on cessation of tobacco  Atrial fibrillation with RVR: Acute.  She was initially placed on a diltiazem drip with conversion back into normal sinus rhythm.  Given recent emesis hold anticoagulation. - check TSH  - Correct electrolytes - Check echocardiogram in a.m.  Hematemesis: Acute.  Patient noted to have small episode of emesis that was found to be guaiac positive.. - Check PT/INR, APTT - Strict I&O's - Antiemetics as needed  - Protonix - May warrant GI consultative services in a.m.  S/p transplant kidney  DVT prophylaxis: SCDs Code Status: Full  Family Communication: No family present at bedside Disposition Plan: TBD  Consults called: none  Admission status: inpatient  Anna Braun  MD [facility] Pager [phone]   If 7PM-7AM, please contact night-coverage [email or web address] Password [email or web address]  10/10/2017, 12:05 AM

## 2017-10-10 NOTE — Progress Notes (Signed)
Pharmacy Antibiotic Note  Anna LobeKathleen [name] is a 67 y.o. female with h/o kidney transplant admitted on 10/09/2017 with AMS, possible sepsis.  Pharmacy has been consulted for Vancomycin Aztreonam and Levaquin dosing.  Vancomycin 1 g IV given in ED at  2200  Plan: Vancomycin 1000 mg IV q12h Aztreonam 1 g IV q8h Levaquin 750 mg IV q24h     Temp (24hrs), Avg:98.3 F (36.8 C), Min:98.3 F (36.8 C), Max:98.3 F (36.8 C)  Recent Labs  Lab 10/09/17 1447 10/09/17 1603 10/09/17 1831  WBC 14.7*  --   --   CREATININE 1.00  --   --   LATICACIDVEN  --  2.79* 2.81*    CrCl cannot be calculated (Unknown ideal weight.).    Allergies  Allergen Reactions  . Iodine-131 Hives and Shortness Of Breath  . 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 Penicillin G Rash    Has patient had a PCN reaction causing immediate rash, facial/tongue/throat swelling, SOB or lightheadedness with hypotension: Yes Has patient had a PCN reaction causing severe rash involving mucus membranes or skin necrosis: Unk Has patient had a PCN reaction that required hospitalization: Unk Has patient had a PCN reaction occurring within the last 10 years: No If all of the above answers are "NO", then may proceed with Cephalosporin use.    Anna [name], Anna [name] 10/10/2017 3:23 AM

## 2017-10-10 NOTE — ED Notes (Signed)
Pt oob to bsc with assist. 

## 2017-10-10 NOTE — ED Notes (Signed)
Pt is a 2 person assist to bedside commode and back in to bed.

## 2017-10-10 NOTE — Progress Notes (Addendum)
  PROGRESS NOTE  Patient admitted earlier this morning. See H&P. Anna [name] is a 67 y.o. 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  At baseline patient lives alone.  History is obtained from review of records as well as the patient.  Apparently, at the time of admission there was complaint of abdominal pain with nausea and vomiting.  Reports associated symptoms of dark stools over the last few days and shortness of breath.  It appears that she recently was taken off of medications and daughter was reported to have said that she thinks that she was withdrawing from opiates and barbiturates possibly.  Patient also admits to alcohol use, but is on clear how much that she drinks on a daily basis. Patient was noted to have one episode of emesis while in the ED that was found to be occult positive.  She was also in A. fib with RVR for which she was given 10 mg of metoprolol without relief and later placed on a diltiazem drip.  Thereafter converted to normal sinus rhythm and the diltiazem drip was stopped. Labs revealed WBC 14.7, lactic acid 2.81, UDS was negative. Patient's alcohol, acetaminophen, and salicylate levels were all within normal limits.  TRH called to admit for altered mental status.    On my examination, patient is alert, oriented to year only.  She does not know where she is and why she is in the hospital.  She is a very poor historian and states that she used to be an alcoholic, used to drink a lot for 45 years.  She states that she quit for 6 years, however relapsed on the night of her daughter's wedding.  She cannot recall her what her daughter's wedding date was, repeats "second of..." multiple times before finally deciding on August 6. She states that she has had dark brown stools, however no black or bloody stools.  She  also denies any nausea, vomiting at home, although does recall having an vomiting episode in the emergency department. She has no focal neurologic deficits on exam.   [place]. She last spoke with patient about a week ago. She is currently at patient's apartment looking after patient's cats and found few empty bottles of ZZZquil and Dimetapp in the trash can. Patient also drinks alcohol-free wine but no alcohol bottles found at patient's home. Daughter also states that patient has been weaning off oxycodone per her PCP due to her chronic pain and opioid addiction and has 2 pills left in the bottle. She also found ativan pill bottle. Interestingly, patient's UDS on admission was negative for benzo and opiates.   Concern for sepsis on admission, started on empiric vanco/levaquin/aztreonam. Procalcitonin only 0.17. Lactic acid and leukocytosis resolved. This could have been all due to dehydration. UA is negative. CT abdomen unremarkable. CXR without acute cardiopulmonary process. She has been afebrile. Will stop antibiotics, watch fevers and blood cultures.  Continue IVF  Neurochecks COWS CIWA Treat as drug withdrawal and watch neuro status  ?GI bleed. Watch for further emesis and stools. Hgb remains stable and will hold off on GI consult for now while her mentation improves.  Replace potassium and magnesium and repeat labs in AM    Anna StainJennifer Jaye Saal, DO [facility] [email or web address] Password [email or web address] 10/10/2017, 12:41 PM

## 2017-10-11 LAB — BASIC METABOLIC PANEL
ANION GAP: 11 (ref 5–15)
BUN: 6 mg/dL (ref 6–20)
CALCIUM: 9.1 mg/dL (ref 8.9–10.3)
CO2: 20 mmol/L — AB (ref 22–32)
Chloride: 110 mmol/L (ref 101–111)
Creatinine, Ser: 0.73 mg/dL (ref 0.44–1.00)
GFR calc Af Amer: >60 mL/min (ref >60)
GLUCOSE: 105 mg/dL — AB (ref 65–99)
Potassium: 3.1 mmol/L — ABNORMAL LOW (ref 3.5–5.1)
Sodium: 141 mmol/L (ref 135–145)

## 2017-10-11 LAB — CBC
HEMATOCRIT: 38.7 % (ref 36.0–46.0)
Hemoglobin: 13.3 g/dL (ref 12.0–15.0)
MCH: 31 pg (ref 26.0–34.0)
MCHC: 34.4 g/dL (ref 30.0–36.0)
MCV: 90.2 fL (ref 78.0–100.0)
PLATELETS: 204 10*3/uL (ref 150–400)
RBC: 4.29 MIL/uL (ref 3.87–5.11)
RDW: 13.6 % (ref 11.5–15.5)
WBC: 6.7 10*3/uL (ref 4.0–10.5)

## 2017-10-11 LAB — MAGNESIUM: Magnesium: 1.7 mg/dL (ref 1.7–2.4)

## 2017-10-11 LAB — MRSA PCR SCREENING: MRSA by PCR: NEGATIVE

## 2017-10-11 MED ORDER — LORAZEPAM 1 MG PO TABS
1.0000 mg | ORAL_TABLET | Freq: Three times a day (TID) | ORAL | Status: DC
  Administered 2017-10-11 – 2017-10-12 (×3): 1 mg via ORAL
  Filled 2017-10-11 (×3): qty 1

## 2017-10-11 MED ORDER — LORAZEPAM 1 MG PO TABS: 1.0000 mg | ORAL_TABLET | Freq: Three times a day (TID) | ORAL | Status: DC

## 2017-10-11 MED ORDER — POTASSIUM CHLORIDE CRYS ER 20 MEQ PO TBCR
40.0000 meq | EXTENDED_RELEASE_TABLET | ORAL | Status: AC
  Administered 2017-10-11 (×2): 40 meq via ORAL
  Filled 2017-10-11 (×2): qty 2

## 2017-10-11 NOTE — Progress Notes (Signed)
Pt transferred from 3 MW, A&O x4 pt oriented to room call bell in reach Vss.

## 2017-10-11 NOTE — Progress Notes (Signed)
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 At baseline patient lives alone. History is obtained from review of records as well as the patient. Apparently, at the time of admission there was complaint of abdominal pain with nausea and vomiting. Reports associated symptoms of dark stools over the last few days and shortness of breath. It appears that she recently was taken off of medications and daughter was reported to have said that she thinks that she was withdrawing from opiates and barbiturates possibly. Patient also admits to alcohol use,but is on clear how much that she drinks on a daily basis. Patient was noted to have one episode of emesis while in the EDthatwas found to be occult positive.  She was also in A. fib with RVR for which she was given 10 mg of metoprolol without relief and later placed on a diltiazem drip. Thereafter converted to normal sinus rhythm and the diltiazem drip was stopped. Labs revealed WBC 14.7, lactic acid 2.81, UDS was negative.Patient's alcohol, acetaminophen, and salicylate levels were all within normal limits. TRH called to admit for altered mental status.   Spoke with daughter on 2/4 who lives in [place]. She last spoke with patient about a week ago. She is currently at patient's apartment looking after patient's cats and found few empty bottles of ZZZquil and Dimetapp in the trash can. Patient also drinks alcohol-free wine but no alcohol bottles found at patient's home. Daughter also states that patient has been weaning off oxycodone per her PCP due to her chronic pain and opioid addiction and has 2 pills left in the bottle.  She also found ativan pill bottle. Interestingly, patient's UDS on admission was negative for benzo and opiates.   Assessment & Plan:   Principal Problem:   Acute encephalopathy Active Problems:   Hematemesis   Atrial fibrillation with RVR (HCC)   Acute toxic encephalopathy -Could be combination from opioid/xanax withdrawal as well as consumption of ZZZquil/Dimetapp over the past several weeks for self-medication  -UDS negative on admission, despite having prescriptions and pill bottles of oxycodone and ativan at home  -TSH normal  -Mention much improved today, she is alert and oriented x 3 although does not recall history surrounding admission. She states that she has been taking 2-3 capfuls of ZZZquil and Dimetapp daily over the past several weeks for sleep. Very upset that her new PCP is weaning her off oxycodone for her chronic pain which she had been taking for 5-10 years  -Continue COWS -Will discontinue CIWA as patient and family deny her recent alcohol intake. 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 Had 1 drink at daughter's wedding last year but denies alcohol use now   SIRS without source of infection found  -Concern for sepsis on admission, started on empiric vanco/levaquin/aztreonam. Procalcitonin only 0.17. Lactic acid and leukocytosis resolved. This could have been all due to dehydration. UA is negative. CT abdomen unremarkable. CXR without acute cardiopulmonary process. She has been afebrile. Will stop antibiotics, watch fevers and blood cultures.   ?GI bleed -Had 1 time episode of emesis in the ED which tested positive for blood. She has not had any further vomiting episodes, and denies any dark/black/bloody stools at  home. She had a normal brown bowel movement this morning  -Hgb remains stable -Monitor   HTN -Continue norvasc, tenormin  Depression -Continue wellbutrin   Hypokalemia -Replace, trend    DVT prophylaxis: SCD Code Status: Full Family  Communication: No family at bedside Disposition Plan: Transfer to telemetry today   Consultants:   None  Procedures:   None   Antimicrobials:  Anti-infectives (From admission, onward)   Start     Dose/Rate Route Frequency Ordered Stop   10/10/17 2000  levofloxacin (LEVAQUIN) IVPB 750 mg  Status:  Discontinued     750 mg 100 mL/hr over 90 Minutes Intravenous Every 24 hours 10/10/17 0332 10/10/17 1250   10/10/17 0600  vancomycin (VANCOCIN) IVPB 1000 mg/200 mL premix  Status:  Discontinued     1,000 mg 200 mL/hr over 60 Minutes Intravenous Every 12 hours 10/10/17 0332 10/10/17 1250   10/10/17 0600  aztreonam (AZACTAM) 1 g in dextrose 5 % 50 mL IVPB  Status:  Discontinued     1 g 100 mL/hr over 30 Minutes Intravenous Every 8 hours 10/10/17 0332 10/10/17 1250   10/09/17 1745  levofloxacin (LEVAQUIN) IVPB 750 mg     750 mg 100 mL/hr over 90 Minutes Intravenous  Once 10/09/17 1742 10/09/17 2200   10/09/17 1745  aztreonam (AZACTAM) 2 g in dextrose 5 % 50 mL IVPB     2 g 100 mL/hr over 30 Minutes Intravenous  Once 10/09/17 1742 10/09/17 1948   10/09/17 1745  vancomycin (VANCOCIN) IVPB 1000 mg/200 mL premix     1,000 mg 200 mL/hr over 60 Minutes Intravenous  Once 10/09/17 1742 10/09/17 2311        Subjective: No new complaints. Feeling better since yesterday and states "yesterday feels like a lifetime ago"  Objective: Vitals:   10/11/17 0629 10/11/17 0700 10/11/17 0742 10/11/17 0902  BP: 126/74  125/75   Pulse: 66 65 65 68  Resp:      Temp:   97.6 F (36.4 C)   TempSrc:   Oral   SpO2:   98%   Weight:      Height:        Intake/Output Summary (Last 24 hours) at 10/11/2017 1140 Last data filed at 10/11/2017 1000 Gross per 24 hour  Intake 5092.91 ml  Output -  Net 5092.91 ml   Filed Weights   10/10/17 1730  Weight: 63.1 kg (139 lb 1.8 oz)    Examination:  General exam: Appears calm and comfortable  Respiratory system: Clear to auscultation. Respiratory effort  normal. Cardiovascular system: S1 & S2 heard, RRR. No JVD, murmurs, rubs, gallops or clicks. No pedal edema. Gastrointestinal system: Abdomen is nondistended, soft and nontender. No organomegaly or masses felt. Normal bowel sounds heard. Central nervous system: Alert and oriented x3. No focal neurological deficits.  Extremities: Symmetric 5 x 5 power. Skin: No rashes, lesions or ulcers Psychiatry: Judgement and insight appear normal. Mood & affect appropriate.   Data Reviewed: I have personally reviewed following labs and imaging studies  CBC: Recent Labs  Lab 10/09/17 1447 10/10/17 0734 10/11/17 0350  WBC 14.7* 11.9* 6.7  NEUTROABS 12.5* 9.9*  --   HGB 15.4* 14.2 13.3  HCT 43.7 40.2 38.7  MCV 89.0 89.1 90.2  PLT 298 234 204   Basic Metabolic Panel: Recent Labs  Lab 10/09/17 1447 10/10/17 0615 10/11/17 0350  NA 137 140 141  K 5.0 3.1* 3.1*  CL 103 107 110  CO2 18* 20* 20*  GLUCOSE  134* 120* 105*  BUN 12 12 6   CREATININE 1.00 0.87 0.73  CALCIUM 9.8 9.7 9.1  MG  --  1.5* 1.7   GFR: Estimated Creatinine Clearance: 67.3 mL/min (by C-G formula based on SCr of 0.73 mg/dL). Liver Function Tests: Recent Labs  Lab 10/09/17 1447  AST 45*  ALT 11*  ALKPHOS 53  BILITOT 2.1*  PROT 6.6  ALBUMIN 4.1   No results for input(s): LIPASE, AMYLASE in the last 168 hours. Recent Labs  Lab 10/09/17 1449  AMMONIA 35   Coagulation Profile: Recent Labs  Lab 10/10/17 1324  INR 1.33   Cardiac Enzymes: No results for input(s): CKTOTAL, CKMB, CKMBINDEX, TROPONINI in the last 168 hours. BNP (last 3 results) No results for input(s): PROBNP in the last 8760 hours. HbA1C: No results for input(s): HGBA1C in the last 72 hours. CBG: No results for input(s): GLUCAP in the last 168 hours. Lipid Profile: No results for input(s): CHOL, HDL, LDLCALC, TRIG, CHOLHDL, LDLDIRECT in the last 72 hours. Thyroid Function Tests: Recent Labs    10/10/17 1324  TSH 0.845   Anemia Panel: No  results for input(s): VITAMINB12, FOLATE, FERRITIN, TIBC, IRON, RETICCTPCT in the last 72 hours. Sepsis Labs: Recent Labs  Lab 10/09/17 1603 10/09/17 1831 10/10/17 0615  PROCALCITON  --   --  0.17  LATICACIDVEN 2.79* 2.81* 1.0    Recent Results (from the past 240 hour(s))  Blood Culture (routine x 2)     Status: None (Preliminary result)   Collection Time: 10/09/17  6:39 PM  Result Value Ref Range Status   Specimen Description BLOOD RIGHT ANTECUBITAL  Final   Special Requests   Final    BOTTLES DRAWN AEROBIC AND ANAEROBIC Blood Culture adequate volume   Culture   Final    NO GROWTH < 24 HOURS Performed at [facility], [address], [place], [place] [address]    Report Status PENDING  Incomplete  MRSA PCR Screening     Status: None   Collection Time: 10/10/17  5:37 PM  Result Value Ref Range Status   MRSA by PCR NEGATIVE NEGATIVE Final    Comment:        The GeneXpert MRSA Assay (FDA approved for NASAL specimens only), is one component of a comprehensive MRSA colonization surveillance program. It is not intended to diagnose MRSA infection nor to guide or monitor treatment for MRSA infections. Performed at [facility], [address], [place], [place] [address]        Radiology Studies: Ct Abdomen Pelvis Wo Contrast  Result Date: 10/09/2017 CLINICAL DATA:  Altered mental status EXAM: CT ABDOMEN AND PELVIS WITHOUT CONTRAST TECHNIQUE: Multidetector CT imaging of the abdomen and pelvis was performed following the standard protocol without IV contrast. COMPARISON:  03/12/2015 FINDINGS: Lower chest: Lung bases demonstrate no acute consolidation or pleural effusion. Heart size is normal Hepatobiliary: Focal steatosis near the falciform ligament. No calcified gallstones or biliary dilatation Pancreas: Unremarkable. No pancreatic ductal dilatation or surrounding inflammatory changes. Spleen: Normal in size without focal abnormality. Adrenals/Urinary Tract: Adrenal  glands are within normal limits. Atrophic kidneys. No hydronephrosis. The bladder is normal. Similar appearance of right lower quadrant transplanted kidney with minimal pelvocaliectasis. Stomach/Bowel: Stomach is within normal limits. Appendix appears normal. No evidence of bowel wall thickening, distention, or inflammatory changes. Sigmoid colon diverticular disease without acute inflammation Vascular/Lymphatic: Mild to moderate aortic atherosclerosis. No aneurysmal dilatation. No significantly enlarged lymph nodes Reproductive: Uterus and bilateral adnexa are unremarkable.  Other: Negative for free air or free fluid. Musculoskeletal: Posterior stabilization rods and fixating screws at L4 and L5, new since 2016 CT. Protrusion of the tip of the right fixating screw at L5 beyond the anterior vertebral margin. IMPRESSION: 1. No CT evidence for acute intra-abdominal or pelvic abnormality 2. Significant atrophy of the native kidneys. No hydronephrosis. Grossly stable appearance of right lower quadrant transplanted kidney 3. Sigmoid colon diverticular disease without acute inflammation 4. Interval posterior stabilization rods and fixating screws at L4 and L5. Tip of the right fixating screw at L5 extends about 7 mm anterior to the vertebral body. Electronically Signed   By: Jasmine Pang M.D.   On: 10/09/2017 21:31   Dg Chest 2 View  Result Date: 10/09/2017 CLINICAL DATA:  Pt presents for evaluation of altered mental status and afib. Pt lives at apartment complex and went to office building. Staff at office building said she was not her normal self EXAM: CHEST  2 VIEW COMPARISON:  To 11/2017 FINDINGS: Heart size is upper normal. The lungs are free of focal consolidations and pleural effusions. No pulmonary edema. IMPRESSION: No active cardiopulmonary disease. Electronically Signed   By: Norva Pavlov M.D.   On: 10/09/2017 19:15   Ct Head Wo Contrast  Result Date: 10/09/2017 CLINICAL DATA:  Altered mental status.   Atrial fibrillation. EXAM: CT HEAD WITHOUT CONTRAST TECHNIQUE: Contiguous axial images were obtained from the base of the skull through the vertex without intravenous contrast. COMPARISON:  None. FINDINGS: Brain: There is no evidence of acute infarct, intracranial hemorrhage, mass, midline shift, or extra-axial fluid collection. Mild cerebral atrophy is within normal limits for age. A cavum septum pellucidum et vergae is incidentally noted. Vascular: Minimal carotid siphon atherosclerosis. No hyperdense vessel. Skull: No fracture or focal osseous lesion. Sinuses/Orbits: Chronic left maxillary sinusitis. Postsurgical changes involving the maxillary sinuses bilaterally. Clear mastoid air cells. Unremarkable orbits. Other: None. IMPRESSION: No evidence of acute intracranial abnormality. Electronically Signed   By: Sebastian Ache M.D.   On: 10/09/2017 15:52   Dg Chest Portable 1 View  Result Date: 10/09/2017 CLINICAL DATA:  Altered mental status and atrial fibrillation. EXAM: PORTABLE CHEST 1 VIEW COMPARISON:  02/15/2011 FINDINGS: Right IJ dialysis catheter is no longer present. Heart is mildly enlarged in part from the AP projection with tortuous atherosclerotic aorta. Curvilinear pleural reflection representing a small left apical pneumothorax or skin fold artifact simulating a left apical pneumothorax. There appear to be some pulmonary vascular markings beyond this reflection more suggestive of a skin fold artifact. Would recommend either repeat imaging or chest CT for further correlation. No pneumonic consolidation, CHF nor effusion. No acute osseous abnormality. IMPRESSION: 1. Curvilinear density overlying the left upper lobe and left lung apex possibly representing a pleural reflection and small apical pneumothorax versus more likely a skin fold artifact as there appear to be some pulmonary markings beyond this curvilinear density. Would recommend repeat chest radiograph and/or chest CT for further correlation.  These results were called by telephone at the time of interpretation on 10/09/2017 at 4:23 pm to Dr. Blane Ohara , who verbally acknowledged these results. 2. Mildly enlarged cardiac silhouette with minimal aortic atherosclerosis. Electronically Signed   By: Tollie Eth M.D.   On: 10/09/2017 16:23      Scheduled Meds: . amLODipine  5 mg Oral Daily  . atenolol  75 mg Oral BID  . buPROPion  300 mg Oral Daily  . cloNIDine  0.1 mg Oral QID   Followed by  . [  START ON 10/12/2017] cloNIDine  0.1 mg Oral BH-qamhs   Followed by  . [START ON 10/14/2017] cloNIDine  0.1 mg Oral QAC breakfast  . [START ON 10/12/2017] LORazepam  0-4 mg Intravenous Q12H   Or  . [START ON 10/12/2017] LORazepam  0-4 mg Oral Q12H  . montelukast  10 mg Oral QHS  . mycophenolate  500 mg Oral BID  . pantoprazole (PROTONIX) IV  40 mg Intravenous BID  . potassium chloride  40 mEq Oral Q4H  . tacrolimus  1 mg Oral QPM  . tacrolimus  2 mg Oral q morning - 10a  . thiamine  100 mg Oral Daily   Or  . thiamine  100 mg Intravenous Daily  . traZODone  150 mg Oral QHS   Continuous Infusions: . sodium chloride 125 mL/hr at 10/11/17 0022     LOS: 1 day    Time spent: 30 minutes   Noralee Stain, DO [facility] [email or web address] Password [email or web address] 10/11/2017, 11:40 AM

## 2017-10-11 NOTE — Care Management Note (Signed)
Case Management Note  Patient Details  Name: Anna LobeKathleen [name] MRN: [id number] Date of Birth: 11/03/1950  Subjective/Objective:  From home alone, presents with encephalopathy, SIRS, ?GIB,  Hx of HTN, Depression.                 Action/Plan: NCM will follow for dc needs.   Expected Discharge Date:                  Expected Discharge Plan:     In-House Referral:     Discharge planning Services  CM Consult  Post Acute Care Choice:    Choice offered to:     DME Arranged:    DME Agency:     HH Arranged:    HH Agency:     Status of Service:  In process, will continue to follow  If discussed at Long Length of Stay Meetings, dates discussed:    Additional Comments:  Leone Havenaylor, Dalynn Jhaveri Clinton, RN 10/11/2017, 4:09 PM

## 2017-10-12 LAB — BASIC METABOLIC PANEL
Anion gap: 10 (ref 5–15)
BUN: 5 mg/dL — ABNORMAL LOW (ref 6–20)
CO2: 22 mmol/L (ref 22–32)
CREATININE: 0.86 mg/dL (ref 0.44–1.00)
Calcium: 8.9 mg/dL (ref 8.9–10.3)
Chloride: 110 mmol/L (ref 101–111)
GFR calc Af Amer: >60 mL/min (ref >60)
GFR calc non Af Amer: >60 mL/min (ref >60)
Glucose, Bld: 100 mg/dL — ABNORMAL HIGH (ref 65–99)
Potassium: 3.6 mmol/L (ref 3.5–5.1)
Sodium: 142 mmol/L (ref 135–145)

## 2017-10-12 LAB — CBC
HCT: 36.4 % (ref 36.0–46.0)
Hemoglobin: 12.3 g/dL (ref 12.0–15.0)
MCH: 30.8 pg (ref 26.0–34.0)
MCHC: 33.8 g/dL (ref 30.0–36.0)
MCV: 91.2 fL (ref 78.0–100.0)
Platelets: 198 10*3/uL (ref 150–400)
RBC: 3.99 MIL/uL (ref 3.87–5.11)
RDW: 13.7 % (ref 11.5–15.5)
WBC: 5.2 10*3/uL (ref 4.0–10.5)

## 2017-10-12 NOTE — Care Management Note (Signed)
Case Management Note  Patient Details  Name: Benjaman LobeKathleen Shur MRN: [id number] Date of Birth: 12/08/1950  Subjective/Objective:    Acute encephalopathy               PCP: Dr: Karlyne [facility]panWymer/ [facility]  [place] [phone], [phone], fax #[phone]  Action/Plan: Transition to home.  Expected Discharge Date:  10/12/17               Expected Discharge Plan:  Home/Self Care  In-House Referral:     Discharge planning Services  CM Consult  Post Acute Care Choice:    Choice offered to:  Patient  DME Arranged:    DME Agency:     HH Arranged:  PT, OT(Pt declined home health services) HH Agency:     Status of Service:  Completed, signed off  If discussed at Long Length of Stay Meetings, dates discussed:    Additional Comments:  Epifanio LeschesCole, Marifer Hurd Hudson, RN 10/12/2017, 4:27 PM

## 2017-10-12 NOTE — Discharge Summary (Signed)
Physician Discharge Summary  Anna [name] ZOX:[id number] DOB: Aug 16, 1951 DOA: 10/09/2017  PCP: Patient, No Pcp Per  Admit date: 10/09/2017 Discharge date: 10/12/2017  Time spent: 35 minutes  Recommendations for Outpatient Follow-up:  1. PCP in  1week 2. Psychiatrist in 1-2weeks 3. Home health PT/OT 4. Gastroenterology in 1-79months 5. 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 At baseline patient lives alone. History is obtained from review of records, EMS, daughter as well as the patient.It appears that she recently was taken off of opiate medications few weeks ago and daughter was reported to have said that she thinks that she was withdrawing from opiates and benzos possibly. Patient also admits to alcohol use,but is on clear how much that she drinks on a daily basis. Patient was noted to have one episode of emesis while in the EDthatwas found to be occult positive.Labs revealed WBC 14.7, lactic acid 2.81, UDS was negative.Patient's alcohol, acetaminophen, and salicylate levels were all within normal limits. Dr.Choi spoke with daughter on 2/4 who lives in [place]. She last spoke with patient about a week ago. dtr was currently at patient's apartment looking after patient's cats and found few empty bottles of ZZZquil and Dimetapp in the trash can. Patient also drinks alcohol-free wine but no alcohol bottles  found at patient's home. Daughter also states that patient has been weaning off oxycodone per her PCP due to her chronic pain and opioid addiction and has 2 pills left in the bottle. She also found ativan pill bottle.  Hospital Course:   Acute toxic encephalopathy -Could be combination from opioid/xanax withdrawal as well as consumption of ZZZquil/Dimetapp over the past several weeks for self-medication  -UDS negative on admission -TSH normal  -Mention improved, AAOx4 and back to baseline -Very upset that her new PCP is weaning her off oxycodone for her chronic pain which she had been taking for 5-10 years  -completed PT/OT eval, Home health PT and OT set up that follow-up -Cautioned regarding over-the-counter sedating medications and combining them -also advised to FU with Psychiatrist  SIRS without source of infection found  -Concern for sepsis on admission, started on empiric vanco/levaquin/aztreonam. Procalcitonin only 0.17. Lactic acid and leukocytosis resolved. This could have been all due to dehydration. UA is negative. CT abdomen unremarkable. CXR without acute cardiopulmonary process. -she has been afebrile, stop antibiotics, cultures negative     ?GI bleed -Had 1 time episode of emesis in the ED which tested positive for blood, could be due to gastritis or Mallory-Weiss tear -Hemoglobin has remained in the normal range, no further episodes of vomiting -She had a normal brown bowel movement this morning and yesterday -Advised nonurgent follow-up with gastroenterology and avoidance of NSAIDs   H/o Renal transplant -stable, continued on Cellcept and tacrolimus -kidney function normal  HTN -Continue norvasc, tenormin  Depression -Continue wellbutrin   Hypokalemia -Replaced    Discharge Exam: Vitals:   10/12/17 0453 10/12/17 1040  BP: Marland Kitchen)  135/56 134/73  Pulse: (!) 56 71  Resp: 18   Temp: 98 F (36.7 C)   SpO2: 96%     General: AAOx3 Cardiovascular:  S1S2/RRR Respiratory: CTAB  Discharge Instructions   Discharge Instructions    Diet - low sodium heart healthy   Complete by:  As directed    Increase activity slowly   Complete by:  As directed      Allergies as of 10/12/2017      Reactions   Iodine-131 Hives, Shortness Of Breath   Nsaids Other (See Comments)   CANNOT TAKE DUE TO KIDNEY TRANSPLANT    Penicillin G Rash   Has patient had a PCN reaction causing immediate rash, facial/tongue/throat swelling, SOB or lightheadedness with hypotension: Yes Has patient had a PCN reaction causing severe rash involving mucus membranes or skin necrosis: Unk Has patient had a PCN reaction that required hospitalization: Unk Has patient had a PCN reaction occurring within the last 10 years: No If all of the above answers are "NO", then may proceed with Cephalosporin use.      Medication List    STOP taking these medications   diazepam 5 MG tablet Commonly known as:  VALIUM   DIMETAPP NIGHT COLD/CONGESTION 6.25-2.5 MG/5ML Liqd Generic drug:  Diphenhydramine-Phenylephrine   HYDROcodone-acetaminophen 5-325 MG tablet Commonly known as:  NORCO/VICODIN   ZZZQUIL 50 MG/30ML Liqd Generic drug:  DiphenhydrAMINE HCl     TAKE these medications   Albuterol Sulfate 108 (90 Base) MCG/ACT Aepb Inhale 2 puffs into the lungs 4 (four) times daily as needed (wheezing/shortness of breath).   amLODipine 5 MG tablet Commonly known as:  NORVASC Take 5 mg by mouth daily. For heart   atenolol 25 MG tablet Commonly known as:  TENORMIN Take 75 mg by mouth 2 (two) times daily. For heart   B-complex with vitamin C tablet Take 1 tablet by mouth daily.   buPROPion 300 MG 24 hr tablet Commonly known as:  WELLBUTRIN XL Take 300 mg by mouth daily.   cholecalciferol 1000 units tablet Commonly known as:  VITAMIN D Take 1,000 Units by mouth daily.   clindamycin 150 MG capsule Commonly known as:  CLEOCIN Take 600 mg by mouth See admin instructions. Take  four capsules (600 mg) by mouth thirty minutes before dental procedures - next appointment March 2019   EVZIO 0.4 MG/0.4ML Soaj Generic drug:  Naloxone HCl Inject 0.4 mg into the muscle See admin instructions. Inject 0.4 ml (0.4 mg) intramuscularly once into the thigh as needed for opiod overdose. If no response after 2-3 minutes or person responds but relapses repeat with an additional dose using new injector device. Call 911 after use.   EXCEDRIN PM 38-500 MG Tabs Generic drug:  Diphenhydramine-APAP (sleep) Take 1-2 tablets by mouth at bedtime as needed (sleep).   Fish Oil 1000 MG Caps Take 2,000 mg by mouth 2 (two) times daily.   fluticasone 50 MCG/ACT nasal spray Commonly known as:  FLONASE Place 1 spray into both nostrils daily as needed for allergies or rhinitis.   gabapentin 100 MG capsule Commonly known as:  NEURONTIN Take 100 mg by mouth 3 (three) times daily.   ipratropium 0.03 % nasal spray Commonly known as:  ATROVENT Place 2 sprays into both nostrils 3 (three) times daily as needed for rhinitis.   lidocaine 5 % Commonly known as:  LIDODERM Place 1 patch onto the skin every other day. Remove & Discard patch within 12 hours or as directed by MD  LORazepam 1 MG tablet Commonly known as:  ATIVAN Take 1 mg by mouth 3 (three) times daily.   methocarbamol 500 MG tablet Commonly known as:  ROBAXIN Take 1,000 mg by mouth 2 (two) times daily.   montelukast 10 MG tablet Commonly known as:  SINGULAIR Take 10 mg by mouth at bedtime.   mycophenolate 500 MG tablet Commonly known as:  CELLCEPT Take 500 mg by mouth 2 (two) times daily.   OVER THE COUNTER MEDICATION Take 1,800 mg by mouth See admin instructions. CBD (30 mg Hemp/1800 mg CBD) - take one capsule by mouth twice daily   oxyCODONE-acetaminophen 5-325 MG tablet Commonly known as:  PERCOCET/ROXICET Take 1 tablet by mouth See admin instructions. Tapered course filled 09/22/17: take 1 tablet by mouth twice daily  for one week, then take 1 tablet daily for one week, then stop   tacrolimus 1 MG capsule Commonly known as:  PROGRAF Take 1-2 mg by mouth See admin instructions. Take 2 capsules (2 mg) by mouth every morning and 1 capsule (1 mg) every evening   traZODone 150 MG tablet Commonly known as:  DESYREL Take 150 mg by mouth at bedtime.      Allergies  Allergen Reactions  . Iodine-131 Hives and Shortness Of Breath  . 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 Penicillin G Rash    Has patient had a PCN reaction causing immediate rash, facial/tongue/throat swelling, SOB or lightheadedness with hypotension: Yes Has patient had a PCN reaction causing severe rash involving mucus membranes or skin necrosis: Unk Has patient had a PCN reaction that required hospitalization: Unk Has patient had a PCN reaction occurring within the last 10 years: No If all of the above answers are "NO", then may proceed with Cephalosporin use.    Follow-up Information    PCP. Schedule an appointment as soon as possible for a visit in 1 week(s).            The results of significant diagnostics from this hospitalization (including imaging, microbiology, ancillary and laboratory) are listed below for reference.    Significant Diagnostic Studies: Ct Abdomen Pelvis Wo Contrast  Result Date: 10/09/2017 CLINICAL DATA:  Altered mental status EXAM: CT ABDOMEN AND PELVIS WITHOUT CONTRAST TECHNIQUE: Multidetector CT imaging of the abdomen and pelvis was performed following the standard protocol without IV contrast. COMPARISON:  03/12/2015 FINDINGS: Lower chest: Lung bases demonstrate no acute consolidation or pleural effusion. Heart size is normal Hepatobiliary: Focal steatosis near the falciform ligament. No calcified gallstones or biliary dilatation Pancreas: Unremarkable. No pancreatic ductal dilatation or surrounding inflammatory changes. Spleen: Normal in size without focal abnormality.  Adrenals/Urinary Tract: Adrenal glands are within normal limits. Atrophic kidneys. No hydronephrosis. The bladder is normal. Similar appearance of right lower quadrant transplanted kidney with minimal pelvocaliectasis. Stomach/Bowel: Stomach is within normal limits. Appendix appears normal. No evidence of bowel wall thickening, distention, or inflammatory changes. Sigmoid colon diverticular disease without acute inflammation Vascular/Lymphatic: Mild to moderate aortic atherosclerosis. No aneurysmal dilatation. No significantly enlarged lymph nodes Reproductive: Uterus and bilateral adnexa are unremarkable. Other: Negative for free air or free fluid. Musculoskeletal: Posterior stabilization rods and fixating screws at L4 and L5, new since 2016 CT. Protrusion of the tip of the right fixating screw at L5 beyond the anterior vertebral margin. IMPRESSION: 1. No CT evidence for acute intra-abdominal or pelvic abnormality 2. Significant atrophy of the native kidneys. No hydronephrosis. Grossly stable appearance of right lower quadrant  transplanted kidney 3. Sigmoid colon diverticular disease without acute inflammation 4. Interval posterior stabilization rods and fixating screws at L4 and L5. Tip of the right fixating screw at L5 extends about 7 mm anterior to the vertebral body. Electronically Signed   By: Jasmine PangKim  Fujinaga M.D.   On: 10/09/2017 21:31   Dg Chest 2 View  Result Date: 10/09/2017 CLINICAL DATA:  Pt presents for evaluation of altered mental status and afib. Pt lives at apartment complex and went to office building. Staff at office building said she was not her normal self EXAM: CHEST  2 VIEW COMPARISON:  To 11/2017 FINDINGS: Heart size is upper normal. The lungs are free of focal consolidations and pleural effusions. No pulmonary edema. IMPRESSION: No active cardiopulmonary disease. Electronically Signed   By: Norva PavlovElizabeth  Brown M.D.   On: 10/09/2017 19:15   Ct Head Wo Contrast  Result Date:  10/09/2017 CLINICAL DATA:  Altered mental status.  Atrial fibrillation. EXAM: CT HEAD WITHOUT CONTRAST TECHNIQUE: Contiguous axial images were obtained from the base of the skull through the vertex without intravenous contrast. COMPARISON:  None. FINDINGS: Brain: There is no evidence of acute infarct, intracranial hemorrhage, mass, midline shift, or extra-axial fluid collection. Mild cerebral atrophy is within normal limits for age. A cavum septum pellucidum et vergae is incidentally noted. Vascular: Minimal carotid siphon atherosclerosis. No hyperdense vessel. Skull: No fracture or focal osseous lesion. Sinuses/Orbits: Chronic left maxillary sinusitis. Postsurgical changes involving the maxillary sinuses bilaterally. Clear mastoid air cells. Unremarkable orbits. Other: None. IMPRESSION: No evidence of acute intracranial abnormality. Electronically Signed   By: Sebastian AcheAllen  Grady M.D.   On: 10/09/2017 15:52   Dg Chest Portable 1 View  Result Date: 10/09/2017 CLINICAL DATA:  Altered mental status and atrial fibrillation. EXAM: PORTABLE CHEST 1 VIEW COMPARISON:  02/15/2011 FINDINGS: Right IJ dialysis catheter is no longer present. Heart is mildly enlarged in part from the AP projection with tortuous atherosclerotic aorta. Curvilinear pleural reflection representing a small left apical pneumothorax or skin fold artifact simulating a left apical pneumothorax. There appear to be some pulmonary vascular markings beyond this reflection more suggestive of a skin fold artifact. Would recommend either repeat imaging or chest CT for further correlation. No pneumonic consolidation, CHF nor effusion. No acute osseous abnormality. IMPRESSION: 1. Curvilinear density overlying the left upper lobe and left lung apex possibly representing a pleural reflection and small apical pneumothorax versus more likely a skin fold artifact as there appear to be some pulmonary markings beyond this curvilinear density. Would recommend repeat chest  radiograph and/or chest CT for further correlation. These results were called by telephone at the time of interpretation on 10/09/2017 at 4:23 pm to Dr. Blane OharaJOSHUA ZAVITZ , who verbally acknowledged these results. 2. Mildly enlarged cardiac silhouette with minimal aortic atherosclerosis. Electronically Signed   By: Tollie Ethavid  Kwon M.D.   On: 10/09/2017 16:23    Microbiology: Recent Results (from the past 240 hour(s))  Blood Culture (routine x 2)     Status: None (Preliminary result)   Collection Time: 10/09/17  6:39 PM  Result Value Ref Range Status   Specimen Description BLOOD RIGHT ANTECUBITAL  Final   Special Requests   Final    BOTTLES DRAWN AEROBIC AND ANAEROBIC Blood Culture adequate volume   Culture   Final    NO GROWTH 2 DAYS Performed at [facility], [address] [place], [place] [address]    Report Status PENDING  Incomplete  Culture, blood (Routine X 2) w  Reflex to ID Panel     Status: None (Preliminary result)   Collection Time: 10/10/17  7:59 AM  Result Value Ref Range Status   Specimen Description BLOOD RIGHT HAND  Final   Special Requests   Final    BOTTLES DRAWN AEROBIC AND ANAEROBIC Blood Culture adequate volume   Culture   Final    NO GROWTH 1 DAY Performed at [facility], [address], [place], [place] [address]    Report Status PENDING  Incomplete  MRSA PCR Screening     Status: None   Collection Time: 10/10/17  5:37 PM  Result Value Ref Range Status   MRSA by PCR NEGATIVE NEGATIVE Final    Comment:        The GeneXpert MRSA Assay (FDA approved for NASAL specimens only), is one component of a comprehensive MRSA colonization surveillance program. It is not intended to diagnose MRSA infection nor to guide or monitor treatment for MRSA infections. Performed at [facility], [address] [place], [place] [address]      Labs: Basic Metabolic Panel: Recent Labs  Lab 10/09/17 1447 10/10/17 0615 10/11/17 0350 10/12/17 0422  NA 137 140  141 142  K 5.0 3.1* 3.1* 3.6  CL 103 107 110 110  CO2 18* 20* 20* 22  GLUCOSE 134* 120* 105* 100*  BUN 12 12 6  5*  CREATININE 1.00 0.87 0.73 0.86  CALCIUM 9.8 9.7 9.1 8.9  MG  --  1.5* 1.7  --    Liver Function Tests: Recent Labs  Lab 10/09/17 1447  AST 45*  ALT 11*  ALKPHOS 53  BILITOT 2.1*  PROT 6.6  ALBUMIN 4.1   No results for input(s): LIPASE, AMYLASE in the last 168 hours. Recent Labs  Lab 10/09/17 1449  AMMONIA 35   CBC: Recent Labs  Lab 10/09/17 1447 10/10/17 0734 10/11/17 0350 10/12/17 0422  WBC 14.7* 11.9* 6.7 5.2  NEUTROABS 12.5* 9.9*  --   --   HGB 15.4* 14.2 13.3 12.3  HCT 43.7 40.2 38.7 36.4  MCV 89.0 89.1 90.2 91.2  PLT 298 234 204 198   Cardiac Enzymes: No results for input(s): CKTOTAL, CKMB, CKMBINDEX, TROPONINI in the last 168 hours. BNP: BNP (last 3 results) No results for input(s): BNP in the last 8760 hours.  ProBNP (last 3 results) No results for input(s): PROBNP in the last 8760 hours.  CBG: No results for input(s): GLUCAP in the last 168 hours.     Signed:  Zannie Cove MD.  [facility] 10/12/2017, 11:58 AM

## 2017-10-12 NOTE — Progress Notes (Signed)
Anna LobeKathleen [name] to be D/C'd Home per MD order.  Discussed with the patient and all questions fully answered.  VSS, Skin clean, dry and intact without evidence of skin break down, no evidence of skin tears noted. IV catheter discontinued intact. Site without signs and symptoms of complications. Dressing and pressure applied.  An After Visit Summary was printed and given to the patient. Patient received prescription.  D/c education completed with patient/family including follow up instructions, medication list, d/c activities limitations if indicated, with other d/c instructions as indicated by MD - patient able to verbalize understanding, all questions fully answered.   Patient instructed to return to ED, call 911, or call MD for any changes in condition.   Patient escorted via WC, and D/C home via private auto.  Anna [name] 10/12/2017 4:11 PM

## 2017-10-12 NOTE — Evaluation (Signed)
Occupational Therapy Evaluation and Discharge Patient Details Name: Anna LobeKathleen [name] MRN: [id number] DOB: [birth year] Today's Date: 10/12/2017    History of Present Illness Pt adm with acute encephalopathy, hematemesis, and afib with RVR. PMH - chronic pain, HTN, depression   Clinical Impression   Pt is functioning at a modified independent to supervision for safety in ADL. Pt became more steady with increasing mobility. Pt likely to progress as medical issues resolve and mobility with nursing staff. No further OT needs.    Follow Up Recommendations  No OT follow up    Equipment Recommendations  None recommended by OT    Recommendations for Other Services       Precautions / Restrictions Restrictions Weight Bearing Restrictions: No      Mobility Bed Mobility               General bed mobility comments: pt in chair  Transfers Overall transfer level: Needs assistance Equipment used: None Transfers: Sit to/from Stand Sit to Stand: Supervision              Balance Overall balance assessment: Needs assistance   Sitting balance-Leahy Scale: Good       Standing balance-Leahy Scale: Good                             ADL either performed or assessed with clinical judgement   ADL                                         General ADL Comments: Pt demonstrated ability to perform toilet transfer, standing grooming and dressing at a supervision to modified independent level.     Vision Baseline Vision/History: Wears glasses Wears Glasses: Reading only Patient Visual Report: No change from baseline       Perception     Praxis      Pertinent Vitals/Pain Pain Assessment: Faces Faces Pain Scale: Hurts a little bit Pain Location: R shoulder--chronic Pain Descriptors / Indicators: Discomfort Pain Intervention(s): Monitored during session     Hand Dominance Right   Extremity/Trunk Assessment Upper Extremity Assessment Upper  Extremity Assessment: Overall WFL for tasks assessed(mild tremor)   Lower Extremity Assessment Lower Extremity Assessment: Defer to PT evaluation       Communication Communication Communication: No difficulties   Cognition Arousal/Alertness: Awake/alert Behavior During Therapy: WFL for tasks assessed/performed Overall Cognitive Status: Within Functional Limits for tasks assessed                                     General Comments       Exercises     Shoulder Instructions      Home Living Family/patient expects to be discharged to:: Private residence Living Arrangements: Alone Available Help at Discharge: Neighbor;Available PRN/intermittently;Friend(s) Type of Home: Apartment       Home Layout: One level     Bathroom Shower/Tub: Chief Strategy OfficerTub/shower unit   Bathroom Toilet: Standard     Home Equipment: None          Prior Functioning/Environment Level of Independence: Independent                 OT Problem List:        OT Treatment/Interventions:      OT Goals(Current goals can be  found in the care plan section) Acute Rehab OT Goals Patient Stated Goal: to go home today  OT Frequency:     Barriers to D/C:            Co-evaluation              AM-PAC PT "6 Clicks" Daily Activity     Outcome Measure Help from another person eating meals?: None Help from another person taking care of personal grooming?: A Little Help from another person toileting, which includes using toliet, bedpan, or urinal?: A Little Help from another person bathing (including washing, rinsing, drying)?: A Little Help from another person to put on and taking off regular upper body clothing?: None Help from another person to put on and taking off regular lower body clothing?: A Little 6 Click Score: 20   End of Session Equipment Utilized During Treatment: Gait belt  Activity Tolerance: Patient tolerated treatment well Patient left: in chair;with call  bell/phone within reach;with chair alarm set  OT Visit Diagnosis: Pain Pain - Right/Left: Right Pain - part of body: Shoulder                Time: 1610-9604 OT Time Calculation (min): 22 min Charges:  OT General Charges $OT Visit: 1 Visit OT Evaluation $OT Eval Low Complexity: 1 Low G-Codes:     Evern Bio 10/12/2017, 12:06 PM  10/12/2017 Martie Round, OTR/L Pager: [phone]

## 2017-10-12 NOTE — Evaluation (Signed)
Physical Therapy Evaluation Patient Details Name: Anna [name] MRN: [id number] DOB: [birth year] Today's Date: 10/12/2017   History of Present Illness  Pt adm with acute encephalopathy, hematemesis, and afib with RVR. PMH - chronic pain, HTN, depression  Clinical Impression  Pt doing well with mobility and no further PT needed.        Follow Up Recommendations No PT follow up    Equipment Recommendations  None recommended by PT    Recommendations for Other Services       Precautions / Restrictions Restrictions Weight Bearing Restrictions: No      Mobility  Bed Mobility Overal bed mobility: Independent             General bed mobility comments: pt in chair  Transfers Overall transfer level: Needs assistance Equipment used: None Transfers: Sit to/from Stand Sit to Stand: Supervision;Modified independent (Device/Increase time)         General transfer comment: Supervision for first sit to stand but then improved to modified independent  Ambulation/Gait Ambulation/Gait assistance: Supervision;Modified independent (Device/Increase time) Ambulation Distance (Feet): 600 Feet Assistive device: None Gait Pattern/deviations: Decreased stride length;Drifts right/left Gait velocity: decr Gait velocity interpretation: Below normal speed for age/gender General Gait Details: Initially slightly unsteady but no loss of balance. As distance increased pt with incr stability and became modified independent.  Stairs            Wheelchair Mobility    Modified Rankin (Stroke Patients Only)       Balance Overall balance assessment: Needs assistance Sitting-balance support: No upper extremity supported Sitting balance-Leahy Scale: Good     Standing balance support: No upper extremity supported Standing balance-Leahy Scale: Good                               Pertinent Vitals/Pain Pain Assessment: Faces Faces Pain Scale: Hurts a little bit Pain  Location: R shoulder--chronic Pain Descriptors / Indicators: Discomfort Pain Intervention(s): Monitored during session    Home Living Family/patient expects to be discharged to:: Private residence Living Arrangements: Alone Available Help at Discharge: Neighbor;Available PRN/intermittently;Friend(s) Type of Home: Apartment       Home Layout: One level Home Equipment: None      Prior Function Level of Independence: Independent               Hand Dominance   Dominant Hand: Right    Extremity/Trunk Assessment   Upper Extremity Assessment Upper Extremity Assessment: Defer to OT evaluation    Lower Extremity Assessment Lower Extremity Assessment: Overall WFL for tasks assessed       Communication   Communication: No difficulties  Cognition Arousal/Alertness: Awake/alert Behavior During Therapy: WFL for tasks assessed/performed Overall Cognitive Status: Within Functional Limits for tasks assessed                                        General Comments      Exercises     Assessment/Plan    PT Assessment Patent does not need any further PT services  PT Problem List         PT Treatment Interventions      PT Goals (Current goals can be found in the Care Plan section)  Acute Rehab PT Goals Patient Stated Goal: to go home today PT Goal Formulation: All assessment and education complete, DC therapy  Frequency     Barriers to discharge        Co-evaluation               AM-PAC PT "6 Clicks" Daily Activity  Outcome Measure Difficulty turning over in bed (including adjusting bedclothes, sheets and blankets)?: None Difficulty moving from lying on back to sitting on the side of the bed? : None Difficulty sitting down on and standing up from a chair with arms (e.g., wheelchair, bedside commode, etc,.)?: None Help needed moving to and from a bed to chair (including a wheelchair)?: None Help needed walking in hospital room?:  None Help needed climbing 3-5 steps with a railing? : A Little 6 Click Score: 23    End of Session   Activity Tolerance: Patient tolerated treatment well Patient left: in chair;with call bell/phone within reach;with chair alarm set Nurse Communication: Mobility status PT Visit Diagnosis: Unsteadiness on feet (R26.81)    Time: 1030-1050 PT Time Calculation (min) (ACUTE ONLY): 20 min   Charges:   PT Evaluation $PT Eval Low Complexity: 1 Low     PT G CodesMarland Kitchen:        [facility] K[facility]dra Caicedo PT [phone]   Angelina OkCary W [facility] 10/12/2017, 12:41 PM

## 2017-10-14 LAB — CULTURE, BLOOD (ROUTINE X 2)
CULTURE: NO GROWTH
SPECIAL REQUESTS: ADEQUATE

## 2017-10-15 LAB — CULTURE, BLOOD (ROUTINE X 2)
Culture: NO GROWTH
SPECIAL REQUESTS: ADEQUATE

## 2019-07-12 ENCOUNTER — Other Ambulatory Visit: Payer: Self-pay

## 2019-07-12 ENCOUNTER — Inpatient Hospital Stay (HOSPITAL_COMMUNITY)
Admission: EM | Admit: 2019-07-12 | Discharge: 2019-07-26 | DRG: 871 | Disposition: A | Discharge status: Skilled Nursing Facility | Payer: Medicare Other | Attending: Student | Admitting: Student

## 2019-07-12 ENCOUNTER — Emergency Department (HOSPITAL_COMMUNITY): Payer: Medicare Other

## 2019-07-12 ENCOUNTER — Encounter (HOSPITAL_COMMUNITY): Payer: Self-pay

## 2019-07-12 DIAGNOSIS — E872 Acidosis: Secondary | ICD-10-CM | POA: Diagnosis present

## 2019-07-12 DIAGNOSIS — E870 Hyperosmolality and hypernatremia: Secondary | ICD-10-CM | POA: Diagnosis present

## 2019-07-12 DIAGNOSIS — F419 Anxiety disorder, unspecified: Secondary | ICD-10-CM | POA: Diagnosis present

## 2019-07-12 DIAGNOSIS — G92 Toxic encephalopathy: Secondary | ICD-10-CM | POA: Diagnosis present

## 2019-07-12 DIAGNOSIS — N179 Acute kidney failure, unspecified: Secondary | ICD-10-CM | POA: Diagnosis present

## 2019-07-12 DIAGNOSIS — K7010 Alcoholic hepatitis without ascites: Secondary | ICD-10-CM | POA: Diagnosis present

## 2019-07-12 DIAGNOSIS — Z66 Do not resuscitate: Secondary | ICD-10-CM | POA: Diagnosis present

## 2019-07-12 DIAGNOSIS — R578 Other shock: Secondary | ICD-10-CM | POA: Diagnosis present

## 2019-07-12 DIAGNOSIS — Z23 Encounter for immunization: Secondary | ICD-10-CM | POA: Diagnosis present

## 2019-07-12 DIAGNOSIS — E876 Hypokalemia: Secondary | ICD-10-CM | POA: Diagnosis present

## 2019-07-12 DIAGNOSIS — J9602 Acute respiratory failure with hypercapnia: Secondary | ICD-10-CM | POA: Diagnosis present

## 2019-07-12 DIAGNOSIS — Z978 Presence of other specified devices: Secondary | ICD-10-CM

## 2019-07-12 DIAGNOSIS — R6510 Systemic inflammatory response syndrome (SIRS) of non-infectious origin without acute organ dysfunction: Secondary | ICD-10-CM

## 2019-07-12 DIAGNOSIS — Z20828 Contact with and (suspected) exposure to other viral communicable diseases: Secondary | ICD-10-CM | POA: Diagnosis present

## 2019-07-12 DIAGNOSIS — E46 Unspecified protein-calorie malnutrition: Secondary | ICD-10-CM | POA: Diagnosis present

## 2019-07-12 DIAGNOSIS — A419 Sepsis, unspecified organism: Principal | ICD-10-CM | POA: Diagnosis present

## 2019-07-12 DIAGNOSIS — E162 Hypoglycemia, unspecified: Secondary | ICD-10-CM | POA: Diagnosis not present

## 2019-07-12 DIAGNOSIS — E860 Dehydration: Secondary | ICD-10-CM | POA: Diagnosis present

## 2019-07-12 DIAGNOSIS — I480 Paroxysmal atrial fibrillation: Secondary | ICD-10-CM | POA: Diagnosis present

## 2019-07-12 DIAGNOSIS — L89319 Pressure ulcer of right buttock, unspecified stage: Secondary | ICD-10-CM | POA: Diagnosis present

## 2019-07-12 DIAGNOSIS — F10229 Alcohol dependence with intoxication, unspecified: Secondary | ICD-10-CM | POA: Diagnosis present

## 2019-07-12 DIAGNOSIS — Z87891 Personal history of nicotine dependence: Secondary | ICD-10-CM

## 2019-07-12 DIAGNOSIS — G9341 Metabolic encephalopathy: Secondary | ICD-10-CM | POA: Diagnosis present

## 2019-07-12 DIAGNOSIS — J45909 Unspecified asthma, uncomplicated: Secondary | ICD-10-CM | POA: Diagnosis present

## 2019-07-12 DIAGNOSIS — E861 Hypovolemia: Secondary | ICD-10-CM | POA: Diagnosis present

## 2019-07-12 DIAGNOSIS — R4182 Altered mental status, unspecified: Secondary | ICD-10-CM

## 2019-07-12 DIAGNOSIS — J9601 Acute respiratory failure with hypoxia: Secondary | ICD-10-CM | POA: Diagnosis present

## 2019-07-12 DIAGNOSIS — D61818 Other pancytopenia: Secondary | ICD-10-CM | POA: Diagnosis present

## 2019-07-12 DIAGNOSIS — F10239 Alcohol dependence with withdrawal, unspecified: Secondary | ICD-10-CM | POA: Diagnosis present

## 2019-07-12 DIAGNOSIS — I110 Hypertensive heart disease with heart failure: Secondary | ICD-10-CM | POA: Diagnosis present

## 2019-07-12 DIAGNOSIS — I5031 Acute diastolic (congestive) heart failure: Secondary | ICD-10-CM | POA: Diagnosis not present

## 2019-07-12 DIAGNOSIS — F1020 Alcohol dependence, uncomplicated: Secondary | ICD-10-CM

## 2019-07-12 DIAGNOSIS — R059 Cough, unspecified: Secondary | ICD-10-CM

## 2019-07-12 DIAGNOSIS — I272 Pulmonary hypertension, unspecified: Secondary | ICD-10-CM | POA: Diagnosis present

## 2019-07-12 DIAGNOSIS — B373 Candidiasis of vulva and vagina: Secondary | ICD-10-CM | POA: Diagnosis present

## 2019-07-12 DIAGNOSIS — Z880 Allergy status to penicillin: Secondary | ICD-10-CM | POA: Diagnosis not present

## 2019-07-12 DIAGNOSIS — Z79899 Other long term (current) drug therapy: Secondary | ICD-10-CM

## 2019-07-12 DIAGNOSIS — I4891 Unspecified atrial fibrillation: Secondary | ICD-10-CM

## 2019-07-12 DIAGNOSIS — Z940 Kidney transplant status: Secondary | ICD-10-CM

## 2019-07-12 DIAGNOSIS — R5381 Other malaise: Secondary | ICD-10-CM | POA: Diagnosis not present

## 2019-07-12 DIAGNOSIS — F329 Major depressive disorder, single episode, unspecified: Secondary | ICD-10-CM | POA: Diagnosis present

## 2019-07-12 DIAGNOSIS — Z781 Physical restraint status: Secondary | ICD-10-CM

## 2019-07-12 DIAGNOSIS — Z0189 Encounter for other specified special examinations: Secondary | ICD-10-CM

## 2019-07-12 DIAGNOSIS — E669 Obesity, unspecified: Secondary | ICD-10-CM | POA: Diagnosis present

## 2019-07-12 DIAGNOSIS — N289 Disorder of kidney and ureter, unspecified: Secondary | ICD-10-CM

## 2019-07-12 DIAGNOSIS — R05 Cough: Secondary | ICD-10-CM

## 2019-07-12 DIAGNOSIS — G9340 Encephalopathy, unspecified: Secondary | ICD-10-CM | POA: Diagnosis present

## 2019-07-12 DIAGNOSIS — R1310 Dysphagia, unspecified: Secondary | ICD-10-CM | POA: Diagnosis not present

## 2019-07-12 DIAGNOSIS — L8990 Pressure ulcer of unspecified site, unspecified stage: Secondary | ICD-10-CM | POA: Insufficient documentation

## 2019-07-12 DIAGNOSIS — D638 Anemia in other chronic diseases classified elsewhere: Secondary | ICD-10-CM | POA: Diagnosis present

## 2019-07-12 DIAGNOSIS — T68XXXA Hypothermia, initial encounter: Secondary | ICD-10-CM

## 2019-07-12 DIAGNOSIS — R0902 Hypoxemia: Secondary | ICD-10-CM

## 2019-07-12 DIAGNOSIS — K760 Fatty (change of) liver, not elsewhere classified: Secondary | ICD-10-CM | POA: Diagnosis present

## 2019-07-12 LAB — CBC WITH DIFFERENTIAL/PLATELET
Abs Immature Granulocytes: 0.14 10*3/uL — ABNORMAL HIGH (ref 0.00–0.07)
Basophils Absolute: 0.1 10*3/uL (ref 0.0–0.1)
Basophils Relative: 0 %
Eosinophils Absolute: 0.3 10*3/uL (ref 0.0–0.5)
Eosinophils Relative: 3 %
HCT: 52.9 % — ABNORMAL HIGH (ref 36.0–46.0)
Hemoglobin: 18.1 g/dL — ABNORMAL HIGH (ref 12.0–15.0)
Immature Granulocytes: 1 %
Lymphocytes Relative: 2 %
Lymphs Abs: 0.3 10*3/uL — ABNORMAL LOW (ref 0.7–4.0)
MCH: 35.1 pg — ABNORMAL HIGH (ref 26.0–34.0)
MCHC: 34.2 g/dL (ref 30.0–36.0)
MCV: 102.5 fL — ABNORMAL HIGH (ref 80.0–100.0)
Monocytes Absolute: 0.8 10*3/uL (ref 0.1–1.0)
Monocytes Relative: 6 %
Neutro Abs: 11.8 10*3/uL — ABNORMAL HIGH (ref 1.7–7.7)
Neutrophils Relative %: 88 %
Platelets: 185 10*3/uL (ref 150–400)
RBC: 5.16 MIL/uL — ABNORMAL HIGH (ref 3.87–5.11)
RDW: 12.8 % (ref 11.5–15.5)
WBC: 13.4 10*3/uL — ABNORMAL HIGH (ref 4.0–10.5)
nRBC: 2.7 % — ABNORMAL HIGH (ref 0.0–0.2)

## 2019-07-12 LAB — COMPREHENSIVE METABOLIC PANEL
ALT: 56 U/L — ABNORMAL HIGH (ref 0–44)
AST: 74 U/L — ABNORMAL HIGH (ref 15–41)
Albumin: 3.4 g/dL — ABNORMAL LOW (ref 3.5–5.0)
Alkaline Phosphatase: 65 U/L (ref 38–126)
Anion gap: 25 — ABNORMAL HIGH (ref 5–15)
BUN: 43 mg/dL — ABNORMAL HIGH (ref 8–23)
CO2: 15 mmol/L — ABNORMAL LOW (ref 22–32)
Calcium: 10.5 mg/dL — ABNORMAL HIGH (ref 8.9–10.3)
Chloride: 107 mmol/L (ref 98–111)
Creatinine, Ser: 1.19 mg/dL — ABNORMAL HIGH (ref 0.44–1.00)
GFR calc Af Amer: 54 mL/min — ABNORMAL LOW (ref >60)
GFR calc non Af Amer: 47 mL/min — ABNORMAL LOW (ref >60)
Glucose, Bld: 128 mg/dL — ABNORMAL HIGH (ref 70–99)
Potassium: 3.3 mmol/L — ABNORMAL LOW (ref 3.5–5.1)
Sodium: 147 mmol/L — ABNORMAL HIGH (ref 135–145)
Total Bilirubin: 1.8 mg/dL — ABNORMAL HIGH (ref 0.3–1.2)
Total Protein: 6.3 g/dL — ABNORMAL LOW (ref 6.5–8.1)

## 2019-07-12 LAB — RAPID URINE DRUG SCREEN, HOSP PERFORMED
Amphetamines: NOT DETECTED
Barbiturates: NOT DETECTED
Benzodiazepines: NOT DETECTED
Cocaine: NOT DETECTED
Opiates: NOT DETECTED
Tetrahydrocannabinol: NOT DETECTED

## 2019-07-12 LAB — POCT I-STAT EG7
Acid-base deficit: 5 mmol/L — ABNORMAL HIGH (ref 0.0–2.0)
Bicarbonate: 17.8 mmol/L — ABNORMAL LOW (ref 20.0–28.0)
Calcium, Ion: 1.17 mmol/L (ref 1.15–1.40)
HCT: 42 % (ref 36.0–46.0)
Hemoglobin: 14.3 g/dL (ref 12.0–15.0)
O2 Saturation: 94 %
Potassium: 2.6 mmol/L — CL (ref 3.5–5.1)
Sodium: 148 mmol/L — ABNORMAL HIGH (ref 135–145)
TCO2: 19 mmol/L — ABNORMAL LOW (ref 22–32)
pCO2, Ven: 27.2 mmHg — ABNORMAL LOW (ref 44.0–60.0)
pH, Ven: 7.425 (ref 7.250–7.430)
pO2, Ven: 66 mmHg — ABNORMAL HIGH (ref 32.0–45.0)

## 2019-07-12 LAB — ETHANOL: Alcohol, Ethyl (B): <10 mg/dL (ref <10)

## 2019-07-12 LAB — PROTIME-INR
INR: 1.3 — ABNORMAL HIGH (ref 0.8–1.2)
Prothrombin Time: 15.8 seconds — ABNORMAL HIGH (ref 11.4–15.2)

## 2019-07-12 LAB — ACETAMINOPHEN LEVEL: Acetaminophen (Tylenol), Serum: <10 ug/mL — ABNORMAL LOW (ref 10–30)

## 2019-07-12 LAB — CBG MONITORING, ED: Glucose-Capillary: 144 mg/dL — ABNORMAL HIGH (ref 70–99)

## 2019-07-12 LAB — LACTIC ACID, PLASMA
Lactic Acid, Venous: 3.4 mmol/L (ref 0.5–1.9)
Lactic Acid, Venous: 3.3 mmol/L (ref 0.5–1.9)

## 2019-07-12 LAB — APTT: aPTT: 28 seconds (ref 24–36)

## 2019-07-12 LAB — SALICYLATE LEVEL: Salicylate Lvl: <7 mg/dL (ref 2.8–30.0)

## 2019-07-12 MED ORDER — SODIUM CHLORIDE 0.9 % IV BOLUS
1000.0000 mL | Freq: Once | INTRAVENOUS | Status: AC
  Administered 2019-07-12: 21:00:00 1000 mL via INTRAVENOUS

## 2019-07-12 MED ORDER — LEVOFLOXACIN IN D5W 750 MG/150ML IV SOLN
750.0000 mg | Freq: Once | INTRAVENOUS | Status: DC
  Filled 2019-07-12: qty 150

## 2019-07-12 MED ORDER — POTASSIUM CHLORIDE 10 MEQ/100ML IV SOLN
10.0000 meq | INTRAVENOUS | Status: DC
  Administered 2019-07-12 – 2019-07-13 (×2): 10 meq via INTRAVENOUS
  Filled 2019-07-12: qty 100

## 2019-07-12 MED ORDER — VANCOMYCIN HCL 10 G IV SOLR
1500.0000 mg | Freq: Once | INTRAVENOUS | Status: AC
  Administered 2019-07-12: 1500 mg via INTRAVENOUS
  Filled 2019-07-12: qty 1500

## 2019-07-12 MED ORDER — SODIUM CHLORIDE 0.9 % IV BOLUS (SEPSIS): 1000.0000 mL | Freq: Once | INTRAVENOUS | Status: DC

## 2019-07-12 MED ORDER — VANCOMYCIN HCL 10 G IV SOLR: 1500.0000 mg | INTRAVENOUS | Status: DC

## 2019-07-12 NOTE — ED Notes (Signed)
Pt placed on Bair hugger 

## 2019-07-12 NOTE — ED Provider Notes (Signed)
[facility] EMERGENCY DEPARTMENT Provider Note   CSN: [id number] Arrival date & time: 07/12/19  1939     History   Chief Complaint Chief Complaint  Patient presents with   Altered Mental Status    HPI Anna [name] is a 68 y.o. female.     HPI   Patient presents today with altered mental status.  She has a history of hypertension, asthma, polysubstance abuse, polypharmacy, renal transplant in 2013 presenting today after a welfare check by police after her daughter had not heard from her for 1 week.  When EMS arrived, patient was unresponsive, initial O2 sats on room air 68% hypotensive initially with a systolic blood pressure of 80.  EMS gave 500 mL of fluids with cause hemodynamic stability with a blood pressure of 109/74.  Reportedly, there were empty alcoholic beverages in the department.  Patient arrives GCS 10, hypothermic, hemodynamically stable otherwise.  Past Medical History:  Diagnosis Date   Asthma    Hypertension    Renal disorder     Patient Active Problem List   Diagnosis Date Noted   Acute encephalopathy 10/10/2017   Hematemesis 10/10/2017   Atrial fibrillation with RVR (HCC) 10/10/2017    Past Surgical History:  Procedure Laterality Date   CESAREAN SECTION     NEPHRECTOMY TRANSPLANTED ORGAN       OB History   No obstetric history on file.      Home Medications    Prior to Admission medications   Medication Sig Start Date End Date Taking? Authorizing Provider  Albuterol Sulfate 108 (90 Base) MCG/ACT AEPB Inhale 2 puffs into the lungs 4 (four) times daily as needed (wheezing/shortness of breath).     [provider]  amLODipine (NORVASC) 5 MG tablet Take 5 mg by mouth daily. For heart    [provider]  atenolol (TENORMIN) 25 MG tablet Take 75 mg by mouth 2 (two) times daily. For heart    [provider]  B Complex-C (B-COMPLEX WITH VITAMIN C) tablet Take 1 tablet by mouth daily.     [provider]  buPROPion (WELLBUTRIN XL) 300 MG 24 hr tablet Take 300 mg by mouth daily.    [provider]  cholecalciferol (VITAMIN D) 1000 units tablet Take 1,000 Units by mouth daily.    [provider]  clindamycin (CLEOCIN) 150 MG capsule Take 600 mg by mouth See admin instructions. Take four capsules (600 mg) by mouth thirty minutes before dental procedures - next appointment March 2019    [provider]  Diphenhydramine-APAP, sleep, (EXCEDRIN PM) 38-500 MG TABS Take 1-2 tablets by mouth at bedtime as needed (sleep).    [provider]  fluticasone (FLONASE) 50 MCG/ACT nasal spray Place 1 spray into both nostrils daily as needed for allergies or rhinitis.     [provider]  gabapentin (NEURONTIN) 100 MG capsule Take 100 mg by mouth 3 (three) times daily.    [provider]  ipratropium (ATROVENT) 0.03 % nasal spray Place 2 sprays into both nostrils 3 (three) times daily as needed for rhinitis.    [provider]  lidocaine (LIDODERM) 5 % Place 1 patch onto the skin every other day. Remove & Discard patch within 12 hours or as directed by MD     [provider]  LORazepam (ATIVAN) 1 MG tablet Take 1 mg by mouth 3 (three) times daily.    [provider]  methocarbamol (ROBAXIN) 500 MG tablet Take 1,000 mg  by mouth 2 (two) times daily.     [provider]  montelukast (SINGULAIR) 10 MG tablet Take 10 mg by mouth at bedtime.    [provider]  mycophenolate (CELLCEPT) 500 MG tablet Take 500 mg by mouth 2 (two) times daily.    [provider]  Naloxone HCl (EVZIO) 0.4 MG/0.4ML SOAJ Inject 0.4 mg into the muscle See admin instructions. Inject 0.4 ml (0.4 mg) intramuscularly once into the thigh as needed for opiod overdose. If no response after 2-3 minutes or person responds but relapses repeat with an additional dose using new injector device. Call 911 after use.    [provider]  Omega-3 Fatty Acids (FISH OIL) 1000 MG CAPS Take 2,000 mg by mouth 2 (two) times daily.     [provider]  OVER THE COUNTER MEDICATION Take 1,800 mg by mouth See admin instructions. CBD (30 mg Hemp/1800 mg CBD) - take one capsule by mouth twice daily    [provider]  oxyCODONE-acetaminophen (PERCOCET/ROXICET) 5-325 MG tablet Take 1 tablet by mouth See admin instructions. Tapered course filled 09/22/17: take 1 tablet by mouth twice daily for one week, then take 1 tablet daily for one week, then stop    [provider]  tacrolimus (PROGRAF) 1 MG capsule Take 1-2 mg by mouth See admin instructions. Take 2 capsules (2 mg) by mouth every morning and 1 capsule (1 mg) every evening    [provider]  traZODone (DESYREL) 150 MG tablet Take 150 mg by mouth at bedtime.    [provider]    Family History No family history on file.  Social History Social History   Tobacco Use   Smoking status: Never Smoker   Smokeless tobacco: Former Engineer, water Use Topics   Alcohol use: Yes   Drug use: Yes    Comment: heroin     Allergies   Iodine-131, Nsaids, and Penicillin g   Review of Systems Review of Systems  Unable to perform ROS: Mental status change     Physical Exam Updated Vital Signs BP 90/68    Pulse (!) 48    Temp (!) 92 F (33.3 C) (Rectal)    Resp (!) 21    Ht  (1.626 m)    Wt 63.1 kg    SpO2 (!) 51%    BMI 23.88 kg/m   Physical Exam Vitals signs and nursing note reviewed.  Constitutional:      General: She is not in acute distress.    Appearance: She is well-developed.  HENT:     Head: Normocephalic and atraumatic.     Mouth/Throat:     Mouth: Mucous membranes are dry.  Eyes:     Conjunctiva/sclera: Conjunctivae normal.  Neck:     Musculoskeletal: Neck supple.     Comments: Cervical collar placed on exam Cardiovascular:     Rate and Rhythm: Normal rate and regular rhythm.     Pulses: Normal  pulses.  Pulmonary:     Effort: Pulmonary effort is normal.     Breath sounds: Normal breath sounds.     Comments: Tachypnea present Abdominal:     General: There is no distension.     Palpations: Abdomen is soft.     Tenderness: There is no abdominal tenderness. There is no guarding.  Skin:    General: Skin is warm and dry.     Comments: Right flank ecchymosis present from likely early bedsore.  Overall poor hygiene  present  Neurological:     Mental Status: She is alert.      ED Treatments / Results  Labs (all labs ordered are listed, but only abnormal results are displayed) Labs Reviewed  CBC WITH DIFFERENTIAL/PLATELET - Abnormal; Notable for the following components:      Result Value   WBC 13.4 (*)    RBC 5.16 (*)    Hemoglobin 18.1 (*)    HCT 52.9 (*)    MCV 102.5 (*)    MCH 35.1 (*)    nRBC 2.7 (*)    Neutro Abs 11.8 (*)    Lymphs Abs 0.3 (*)    Abs Immature Granulocytes 0.14 (*)    All other components within normal limits  COMPREHENSIVE METABOLIC PANEL - Abnormal; Notable for the following components:   Sodium 147 (*)    Potassium 3.3 (*)    CO2 15 (*)    Glucose, Bld 128 (*)    BUN 43 (*)    Creatinine, Ser 1.19 (*)    Calcium 10.5 (*)    Total Protein 6.3 (*)    Albumin 3.4 (*)    AST 74 (*)    ALT 56 (*)    Total Bilirubin 1.8 (*)    GFR calc non Af Amer 47 (*)    GFR calc Af Amer 54 (*)    Anion gap 25 (*)    All other components within normal limits  LACTIC ACID, PLASMA - Abnormal; Notable for the following components:   Lactic Acid, Venous 3.4 (*)    All other components within normal limits  ACETAMINOPHEN LEVEL - Abnormal; Notable for the following components:   Acetaminophen (Tylenol), Serum <10 (*)    All other components within normal limits  CBG MONITORING, ED - Abnormal; Notable for the following components:   Glucose-Capillary 144 (*)    All other components within normal limits  CULTURE, BLOOD (ROUTINE X 2)  CULTURE, BLOOD (ROUTINE X  2)  URINE CULTURE  SALICYLATE LEVEL  ETHANOL  LACTIC ACID, PLASMA  RAPID URINE DRUG SCREEN, HOSP PERFORMED  APTT  PROTIME-INR  URINALYSIS, ROUTINE W REFLEX MICROSCOPIC  TSH  T4, FREE  CK    EKG EKG Interpretation  Date/Time:  Thursday July 12 2019 19:49:14 EST Ventricular Rate:  103 PR Interval:    QRS Duration: 91 QT Interval:  349 QTC Calculation: 410 R Axis:   44 Text Interpretation: Atrial fibrillation Non-specific intra-ventricular conduction block Non-specific ST-t changes Confirmed by Cathren LaineSteinl, Kevin ([id number]) on 07/12/2019 7:54:31 PM   Radiology Ct Head Wo Contrast  Result Date: 07/12/2019 CLINICAL DATA:  Found on floor.  Altered level of consciousness. EXAM: CT HEAD WITHOUT CONTRAST CT CERVICAL SPINE WITHOUT CONTRAST TECHNIQUE: Multidetector CT imaging of the head and cervical spine was performed following the standard protocol without intravenous contrast. Multiplanar CT image reconstructions of the cervical spine were also generated. COMPARISON:  10/09/2017 FINDINGS: CT HEAD FINDINGS Brain: There is atrophy and chronic small vessel disease changes. No acute intracranial abnormality. Specifically, no hemorrhage, hydrocephalus, mass lesion, acute infarction, or significant intracranial injury. Vascular: No hyperdense vessel or unexpected calcification. Skull: No acute calvarial abnormality. Sinuses/Orbits: Visualized paranasal sinuses and mastoids clear. Orbital soft tissues unremarkable. Other: None CT CERVICAL SPINE FINDINGS Alignment: Normal Skull base and vertebrae: No acute fracture. No primary bone lesion or focal pathologic process. Soft tissues and spinal canal: No prevertebral fluid or swelling. No visible canal hematoma. Disc levels:  Diffuse degenerative disc and facet disease. Upper chest: No  acute findings Other: None IMPRESSION: Atrophy, chronic microvascular disease. No acute intracranial abnormality. No acute bony abnormality in the cervical spine.  Spondylosis.  Electronically Signed   By: Charlett Nose M.D.   On: 07/12/2019 21:02   Ct Cervical Spine Wo Contrast  Result Date: 07/12/2019 CLINICAL DATA:  Found on floor.  Altered level of consciousness. EXAM: CT HEAD WITHOUT CONTRAST CT CERVICAL SPINE WITHOUT CONTRAST TECHNIQUE: Multidetector CT imaging of the head and cervical spine was performed following the standard protocol without intravenous contrast. Multiplanar CT image reconstructions of the cervical spine were also generated. COMPARISON:  10/09/2017 FINDINGS: CT HEAD FINDINGS Brain: There is atrophy and chronic small vessel disease changes. No acute intracranial abnormality. Specifically, no hemorrhage, hydrocephalus, mass lesion, acute infarction, or significant intracranial injury. Vascular: No hyperdense vessel or unexpected calcification. Skull: No acute calvarial abnormality. Sinuses/Orbits: Visualized paranasal sinuses and mastoids clear. Orbital soft tissues unremarkable. Other: None CT CERVICAL SPINE FINDINGS Alignment: Normal Skull base and vertebrae: No acute fracture. No primary bone lesion or focal pathologic process. Soft tissues and spinal canal: No prevertebral fluid or swelling. No visible canal hematoma. Disc levels:  Diffuse degenerative disc and facet disease. Upper chest: No acute findings Other: None IMPRESSION: Atrophy, chronic microvascular disease. No acute intracranial abnormality. No acute bony abnormality in the cervical spine.  Spondylosis. Electronically Signed   By: Charlett Nose M.D.   On: 07/12/2019 21:02   Dg Chest Portable 1 View  Result Date: 07/12/2019 CLINICAL DATA:  Evaluate for aspiration pneumonia EXAM: PORTABLE CHEST 1 VIEW COMPARISON:  10/09/2017 FINDINGS: Heart is upper limits normal in size. Lungs clear. No effusions. No acute bony abnormality. IMPRESSION: No active disease. Electronically Signed   By: Charlett Nose M.D.   On: 07/12/2019 20:25    Procedures Procedures (including critical care time)  Medications  Ordered in ED Medications  vancomycin (VANCOCIN) 1,500 mg in sodium chloride 0.9 % 500 mL IVPB (has no administration in time range)  levofloxacin (LEVAQUIN) IVPB 750 mg (has no administration in time range)  sodium chloride 0.9 % bolus 1,000 mL (1,000 mLs Intravenous New Bag/Given 07/12/19 2105)     Initial Impression / Assessment and Plan / ED Course  I have reviewed the triage vital signs and the nursing notes.  Pertinent labs & imaging results that were available during my care of the patient were reviewed by me and considered in my medical decision making (see chart for details).        Anna [name] is a 68 y.o. female with past medical history of asthma, hypertension, polysubstance abuse, renal transplant in 2013 presenting today for altered mental status with unknown last known normal (last seen normal about a week ago).  Labs and imaging ordered for a differential diagnosis of trauma, cervical spine fracture, toxic etiology from alcohol/Tylenol/aspirin, UDS were other intoxication, thyroid studies, sepsis.    EKG showed A. fib with rate of 103, normal, nonspecific T changes.  Patient placed on a Bair hugger for external warming. Labs show the patient appears dehydrated.  Normal saline bolus ordered for renal insufficiency.  CK pending.  Sodium 147 and elevated.  Creatinine 1.19, baseline around 0.8.  Antibiotics ordered for possible sepsis.  No endorgan damage present at this time, so patient does not qualify for full sepsis diagnosis.  SIRS present.  Plan to admit to medicine for further evaluation for continued altered mental status.  Care of patient discussed with the supervising attending.  Final Clinical Impressions(s) / ED Diagnoses  Final diagnoses:  SIRS (systemic inflammatory response syndrome) (HCC)  Altered mental status, unspecified altered mental status type  Hypothermia, initial encounter  Renal insufficiency  Hypernatremia    ED Discharge Orders    None        Julianne Rice, MD 07/12/19 0263    Lajean Saver, MD 07/13/19 1623

## 2019-07-12 NOTE — Progress Notes (Signed)
Pharmacy Antibiotic Note  Anna [name] is a 68 y.o. female admitted on 07/12/2019 with Wound infection.  Pharmacy has been consulted for Vancomycin  dosing.  Height: 5\' 4"  (162.6 cm) Weight: 139 lb 1.8 oz (63.1 kg) IBW/kg (Calculated) : 54.7  Temp (24hrs), Avg:91.6 F (33.1 C), Min:91.1 F (32.8 C), Max:92 F (33.3 C)  Recent Labs  Lab 07/12/19 2013  WBC 13.4*  CREATININE 1.19*  LATICACIDVEN 3.4*    Estimated Creatinine Clearance: 39.1 mL/min (A) (by C-G formula based on SCr of 1.19 mg/dL (H)).    Allergies  Allergen Reactions  . Iodine-131 Hives and Shortness Of Breath  . 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 Penicillin G Rash    Has patient had a PCN reaction causing immediate rash, facial/tongue/throat swelling, SOB or lightheadedness with hypotension: Yes Has patient had a PCN reaction causing severe rash involving mucus membranes or skin necrosis: Unk Has patient had a PCN reaction that required hospitalization: Unk Has patient had a PCN reaction occurring within the last 10 years: No If all of the above answers are "NO", then may proceed with Cephalosporin use.     Antimicrobials this admission: 11/5 Levaquin >>  11/5 Vancomycin >>   Microbiology results: 11/5 BCx: pending 11/5 UCx: Pending   Plan: - Will start Vancomycin 1500 mg IV q48h  - Calculated AUC of 449 - Monitor patient Scr for improvement   Thank you for allowing pharmacy to be a part of this patient's care.  Duanne Limerick PharmD. BCPS 07/12/2019 10:48 PM

## 2019-07-12 NOTE — ED Notes (Signed)
Pt has red circular bruise w/ecchymosis in middle of bruise on right hip. Pt has 3 red lines like a scratch on the right buttock. Pt has brown circular mark on bottom of buttocks.

## 2019-07-12 NOTE — ED Notes (Signed)
[name],Anna   EMERGENCY CONTACT PHONE       [phone]   EMERGENCY CONTACT RELATION    Daughter

## 2019-07-12 NOTE — ED Triage Notes (Signed)
Pt arrived via [place] from home. Family had EMS do a welfare check and EMS found pt lying on bathroom floor. Initial bp for EMS was 80 palp and Sa02 68% RA. EMS gave NS 556ml borught bp up to 109/74. Pt is A&Ox3. GCS 14. PERRLA. A-fib on monitor

## 2019-07-12 NOTE — ED Notes (Signed)
I-Stat Venous Blood Gas abnormal results (Potassium 2.6) reported to Dr. Ashok Cordia.

## 2019-07-12 NOTE — H&P (Signed)
TRH H&P    Patient Demographics:    Orpah Hausner, is a 68 y.o. female  MRN: [id number]  DOB - [birth year]  Admit Date - 07/12/2019  Referring MD/NP/PA: Dr. Monna Fam  Outpatient Primary MD for the patient is Patient, No Pcp Per  Patient coming from: Home  Chief complaint- Found down   HPI:    Rheba Diamond  is a 68 y.o. female, with history of HTN, asthma, and polysubstance abuse, presents to the ED today via EMS. Apparently the patient had not been in contact with either daughter for 1 week. She missed her rent payment, and when the land lord went to her apartment, she didn't answer. Land lord spoke to daughter and together they decided to call the police for a wellness check. Patient was found down in her bathroom with tylenol pills all around her. She was unresponsive, and EMS was called. When EMS arrived they report that her O2 sat was 68%, SBP was 80. They administered bolus and this raised her BP to 109/74. After they left, daughter searched the apartment and found hundred of 1.5L wine bottles empty, as well as hard cider. Daughter reports that she has been going through the patient's online grocery orders, and she order 6, 1.5L bottles of wine per week. Daughter suspects she is also going to the store and buying more wine in person. In the past patient has abused Zquil and children's Dimetapp. There was no evidence of these medications in the apartment this time.   In the ED, patient placed on Bair Hugger. Code sepsis was called due to vital signs. C-collar was placed until C spine CT revealed no bony abnormalities. CT head showed atrophy, chronic microvascular disease, with no acute intracranial abnormalities. CXR showed no active disease. EtOH, tylenol, and aspirin levels normal. UDS pending. TSH pending. Blood cultures and urine culture collected. CMP showed Na 147, K +3.3, Cr 1.19. EKG shows Afib  with rate of 103, QTC 410, non-spevific T wave changes. Patient is admitted to progressive unit for further management and evaluation of altered mental status.     Review of systems:    Patient is not able to provide ROS due to altered mental status   Past History of the following :    Past Medical History:  Diagnosis Date  . Asthma   . Hypertension   . Renal disorder       Past Surgical History:  Procedure Laterality Date  . CESAREAN SECTION    . NEPHRECTOMY TRANSPLANTED ORGAN        Social History:      Social History   Tobacco Use  . Smoking status: Never Smoker  . Smokeless tobacco: Former Engineer, water Use Topics  . Alcohol use: Yes       Family History :    No family history on file. Patient is not able to provide family history due to altered mental status.   Home Medications:   Prior to Admission medications   Medication Sig Start Date End Date Taking? Authorizing Provider  Albuterol Sulfate 108 (90 Base) MCG/ACT AEPB Inhale 2 puffs into the lungs 4 (four) times daily as needed (wheezing/shortness of breath).     [provider]  amLODipine (NORVASC) 5 MG tablet Take 5 mg by mouth daily. For heart    [provider]  atenolol (TENORMIN) 25 MG tablet Take 75 mg by mouth 2 (two) times daily. For heart    [provider]  B Complex-C (B-COMPLEX WITH VITAMIN C) tablet Take 1 tablet by mouth daily.    [provider]  buPROPion (WELLBUTRIN XL) 300 MG 24 hr tablet Take 300 mg by mouth daily.    [provider]  cholecalciferol (VITAMIN D) 1000 units tablet Take 1,000 Units by mouth daily.    [provider]  clindamycin (CLEOCIN) 150 MG capsule Take 600 mg by mouth See admin instructions. Take four capsules (600 mg) by mouth thirty minutes before dental procedures - next appointment March 2019    [provider]  Diphenhydramine-APAP, sleep, (EXCEDRIN PM) 38-500 MG TABS Take 1-2 tablets by mouth  at bedtime as needed (sleep).    [provider]  fluticasone (FLONASE) 50 MCG/ACT nasal spray Place 1 spray into both nostrils daily as needed for allergies or rhinitis.     [provider]  gabapentin (NEURONTIN) 100 MG capsule Take 100 mg by mouth 3 (three) times daily.    [provider]  ipratropium (ATROVENT) 0.03 % nasal spray Place 2 sprays into both nostrils 3 (three) times daily as needed for rhinitis.    [provider]  lidocaine (LIDODERM) 5 % Place 1 patch onto the skin every other day. Remove & Discard patch within 12 hours or as directed by MD     [provider]  LORazepam (ATIVAN) 1 MG tablet Take 1 mg by mouth 3 (three) times daily.    [provider]  methocarbamol (ROBAXIN) 500 MG tablet Take 1,000 mg by mouth 2 (two) times daily.     [provider]  montelukast (SINGULAIR) 10 MG tablet Take 10 mg by mouth at bedtime.    [provider]  mycophenolate (CELLCEPT) 500 MG tablet Take 500 mg by mouth 2 (two) times daily.    [provider]  Naloxone HCl (EVZIO) 0.4 MG/0.4ML SOAJ Inject 0.4 mg into the muscle See admin instructions. Inject 0.4 ml (0.4 mg) intramuscularly once into the thigh as needed for opiod overdose. If no response after 2-3 minutes or person responds but relapses repeat with an additional dose using new injector device. Call 911 after use.    [provider]  Omega-3 Fatty Acids (FISH OIL) 1000 MG CAPS Take 2,000 mg by mouth 2 (two) times daily.     [provider]  OVER THE COUNTER MEDICATION Take 1,800 mg by mouth See admin instructions. CBD (30 mg Hemp/1800 mg CBD) - take one capsule by mouth twice daily    [provider]  oxyCODONE-acetaminophen (PERCOCET/ROXICET) 5-325 MG tablet Take 1 tablet by mouth See admin instructions. Tapered course filled 09/22/17: take 1 tablet by mouth twice daily for one week, then take 1 tablet daily for one week, then stop     [provider]  tacrolimus (PROGRAF) 1 MG capsule Take 1-2 mg by mouth See admin instructions. Take 2 capsules (2 mg) by mouth every morning and 1 capsule (1 mg) every evening    [provider]  traZODone (DESYREL) 150 MG tablet Take 150 mg by mouth at bedtime.  [provider]     Allergies:     Allergies  Allergen Reactions  . Iodine-131 Hives and Shortness Of Breath  . 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 Penicillin G Rash    Has patient had a PCN reaction causing immediate rash, facial/tongue/throat swelling, SOB or lightheadedness with hypotension: Yes Has patient had a PCN reaction causing severe rash involving mucus membranes or skin necrosis: Unk Has patient had a PCN reaction that required hospitalization: Unk Has patient had a PCN reaction occurring within the last 10 years: No If all of the above answers are "NO", then may proceed with Cephalosporin use.      Physical Exam:   Vitals  Blood pressure 90/68, pulse (!) 48, temperature (!) 92 F (33.3 C), temperature source Rectal, resp. rate (!) 21, height 5\' 4"  (1.626 m), weight 63.1 kg, SpO2 (!) 51 %.  1.  General: Supine in bed. Ill appearing. Eyes open.   2. Psychiatric: Non verbal  3. Neurologic: GCS 9 Non verbal, withdraws from pain. Pupils reactive to light.  Can not participate in further neuro exam.  4. HEENMT:  Atraumatic, normocephalic  5. Respiratory : LCTABL  6. Cardiovascular : Irregularly irregular rhythm Systolic murmur  7. Gastrointestinal:  Soft, non distended, without gaurding  8. Skin:  Cool and dry  9.Musculoskeletal:  No peripheral edema    Data Review:    CBC Recent Labs  Lab 07/12/19 2013  WBC 13.4*  HGB 18.1*  HCT 52.9*  PLT 185  MCV 102.5*  MCH 35.1*  MCHC 34.2  RDW 12.8  LYMPHSABS 0.3*  MONOABS 0.8  EOSABS 0.3  BASOSABS 0.1    ------------------------------------------------------------------------------------------------------------------  Results for orders placed or performed during the hospital encounter of 07/12/19 (from the past 48 hour(s))  CBC with Differential     Status: Abnormal   Collection Time: 07/12/19  8:13 PM  Result Value Ref Range   WBC 13.4 (H) 4.0 - 10.5 K/uL   RBC 5.16 (H) 3.87 - 5.11 MIL/uL   Hemoglobin 18.1 (H) 12.0 - 15.0 g/dL   HCT 13/05/20 (H) 86.7 - 67.2 %   MCV 102.5 (H) 80.0 - 100.0 fL   MCH 35.1 (H) 26.0 - 34.0 pg   MCHC 34.2 30.0 - 36.0 g/dL   RDW 09.4 70.9 - 62.8 %   Platelets 185 150 - 400 K/uL   nRBC 2.7 (H) 0.0 - 0.2 %   Neutrophils Relative % 88 %   Neutro Abs 11.8 (H) 1.7 - 7.7 K/uL   Lymphocytes Relative 2 %   Lymphs Abs 0.3 (L) 0.7 - 4.0 K/uL   Monocytes Relative 6 %   Monocytes Absolute 0.8 0.1 - 1.0 K/uL   Eosinophils Relative 3 %   Eosinophils Absolute 0.3 0.0 - 0.5 K/uL   Basophils Relative 0 %   Basophils Absolute 0.1 0.0 - 0.1 K/uL   Immature Granulocytes 1 %   Abs Immature Granulocytes 0.14 (H) 0.00 - 0.07 K/uL    Comment: Performed at [facility], [address], [place], [place]  Comprehensive metabolic panel     Status: Abnormal   Collection Time: 07/12/19  8:13 PM  Result Value Ref Range   Sodium 147 (H) 135 - 145 mmol/L   Potassium 3.3 (L) 3.5 - 5.1 mmol/L   Chloride 107 98 - 111 mmol/L   CO2 15 (L) 22 - 32 mmol/L   Glucose, Bld 128 (H)  70 - 99 mg/dL   BUN 43 (H) 8 - 23 mg/dL   Creatinine, Ser 1.61 (H) 0.44 - 1.00 mg/dL   Calcium 09.6 (H) 8.9 - 10.3 mg/dL   Total Protein 6.3 (L) 6.5 - 8.1 g/dL   Albumin 3.4 (L) 3.5 - 5.0 g/dL   AST 74 (H) 15 - 41 U/L   ALT 56 (H) 0 - 44 U/L   Alkaline Phosphatase 65 38 - 126 U/L   Total Bilirubin 1.8 (H) 0.3 - 1.2 mg/dL   GFR calc non Af Amer 47 (L) >60 mL/min   GFR calc Af Amer 54 (L) >60 mL/min   Anion gap 25 (H) 5 - 15    Comment: Performed at [facility], [address],  [place], [place] [address]  Lactic acid, plasma     Status: Abnormal   Collection Time: 07/12/19  8:13 PM  Result Value Ref Range   Lactic Acid, Venous 3.4 (HH) 0.5 - 1.9 mmol/L    Comment: CRITICAL RESULT CALLED TO, READ BACK BY AND VERIFIED WITH: Lorenso Quarry 2052 07/12/2019 WBOND Performed at [facility], [address] [place], [place] [address]   Acetaminophen level     Status: Abnormal   Collection Time: 07/12/19  8:13 PM  Result Value Ref Range   Acetaminophen (Tylenol), Serum <10 (L) 10 - 30 ug/mL    Comment: (NOTE) Therapeutic concentrations vary significantly. A range of 10-30 ug/mL  may be an effective concentration for many patients. However, some  are best treated at concentrations outside of this range. Acetaminophen concentrations >150 ug/mL at 4 hours after ingestion  and >50 ug/mL at 12 hours after ingestion are often associated with  toxic reactions. Performed at [facility], [address] [place], [place] [address]   Salicylate level     Status: None   Collection Time: 07/12/19  8:13 PM  Result Value Ref Range   Salicylate Lvl <7.0 2.8 - 30.0 mg/dL    Comment: Performed at [facility], [address], [place], [place] [address]  Ethanol     Status: None   Collection Time: 07/12/19  8:13 PM  Result Value Ref Range   Alcohol, Ethyl (B) <10 <10 mg/dL    Comment: (NOTE) Lowest detectable limit for serum alcohol is 10 mg/dL. For medical purposes only. Performed at [facility], [address] [place], [place] [address]   POC CBG, ED     Status: Abnormal   Collection Time: 07/12/19  8:26 PM  Result Value Ref Range   Glucose-Capillary 144 (H) 70 - 99 mg/dL    Chemistries  Recent Labs  Lab 07/12/19 2013  NA 147*  K 3.3*  CL 107  CO2 15*  GLUCOSE 128*  BUN 43*  CREATININE 1.19*  CALCIUM 10.5*  AST 74*  ALT 56*  ALKPHOS 65  BILITOT 1.8*    ------------------------------------------------------------------------------------------------------------------  ------------------------------------------------------------------------------------------------------------------ GFR: Estimated Creatinine Clearance: 39.1 mL/min (A) (by C-G formula based on SCr of 1.19 mg/dL (H)). Liver Function Tests: Recent Labs  Lab 07/12/19 2013  AST 74*  ALT 56*  ALKPHOS 65  BILITOT 1.8*  PROT 6.3*  ALBUMIN 3.4*   No results for input(s): LIPASE, AMYLASE in the last 168 hours. No results for input(s): AMMONIA in the last 168 hours. Coagulation Profile: No results for input(s): INR, PROTIME in the last 168 hours. Cardiac Enzymes: No results for input(s): CKTOTAL, CKMB, CKMBINDEX, TROPONINI in the last 168 hours. BNP (  last 3 results) No results for input(s): PROBNP in the last 8760 hours. HbA1C: No results for input(s): HGBA1C in the last 72 hours. CBG: Recent Labs  Lab 07/12/19 2026  GLUCAP 144*   Lipid Profile: No results for input(s): CHOL, HDL, LDLCALC, TRIG, CHOLHDL, LDLDIRECT in the last 72 hours. Thyroid Function Tests: No results for input(s): TSH, T4TOTAL, FREET4, T3FREE, THYROIDAB in the last 72 hours. Anemia Panel: No results for input(s): VITAMINB12, FOLATE, FERRITIN, TIBC, IRON, RETICCTPCT in the last 72 hours.  --------------------------------------------------------------------------------------------------------------- Urine analysis:    Component Value Date/Time   COLORURINE YELLOW 10/09/2017 [place] 10/09/2017 1449   LABSPEC 1.020 10/09/2017 1449   PHURINE 5.0 10/09/2017 1449   GLUCOSEU NEGATIVE 10/09/2017 1449   HGBUR NEGATIVE 10/09/2017 1449   BILIRUBINUR NEGATIVE 10/09/2017 1449   KETONESUR 80 (A) 10/09/2017 1449   PROTEINUR 100 (A) 10/09/2017 1449   NITRITE NEGATIVE 10/09/2017 1449   LEUKOCYTESUR NEGATIVE 10/09/2017 1449      Imaging Results:    Ct Head Wo Contrast  Result Date:  07/12/2019 CLINICAL DATA:  Found on floor.  Altered level of consciousness. EXAM: CT HEAD WITHOUT CONTRAST CT CERVICAL SPINE WITHOUT CONTRAST TECHNIQUE: Multidetector CT imaging of the head and cervical spine was performed following the standard protocol without intravenous contrast. Multiplanar CT image reconstructions of the cervical spine were also generated. COMPARISON:  10/09/2017 FINDINGS: CT HEAD FINDINGS Brain: There is atrophy and chronic small vessel disease changes. No acute intracranial abnormality. Specifically, no hemorrhage, hydrocephalus, mass lesion, acute infarction, or significant intracranial injury. Vascular: No hyperdense vessel or unexpected calcification. Skull: No acute calvarial abnormality. Sinuses/Orbits: Visualized paranasal sinuses and mastoids clear. Orbital soft tissues unremarkable. Other: None CT CERVICAL SPINE FINDINGS Alignment: Normal Skull base and vertebrae: No acute fracture. No primary bone lesion or focal pathologic process. Soft tissues and spinal canal: No prevertebral fluid or swelling. No visible canal hematoma. Disc levels:  Diffuse degenerative disc and facet disease. Upper chest: No acute findings Other: None IMPRESSION: Atrophy, chronic microvascular disease. No acute intracranial abnormality. No acute bony abnormality in the cervical spine.  Spondylosis. Electronically Signed   By: Rolm Baptise M.D.   On: 07/12/2019 21:02   Ct Cervical Spine Wo Contrast  Result Date: 07/12/2019 CLINICAL DATA:  Found on floor.  Altered level of consciousness. EXAM: CT HEAD WITHOUT CONTRAST CT CERVICAL SPINE WITHOUT CONTRAST TECHNIQUE: Multidetector CT imaging of the head and cervical spine was performed following the standard protocol without intravenous contrast. Multiplanar CT image reconstructions of the cervical spine were also generated. COMPARISON:  10/09/2017 FINDINGS: CT HEAD FINDINGS Brain: There is atrophy and chronic small vessel disease changes. No acute intracranial  abnormality. Specifically, no hemorrhage, hydrocephalus, mass lesion, acute infarction, or significant intracranial injury. Vascular: No hyperdense vessel or unexpected calcification. Skull: No acute calvarial abnormality. Sinuses/Orbits: Visualized paranasal sinuses and mastoids clear. Orbital soft tissues unremarkable. Other: None CT CERVICAL SPINE FINDINGS Alignment: Normal Skull base and vertebrae: No acute fracture. No primary bone lesion or focal pathologic process. Soft tissues and spinal canal: No prevertebral fluid or swelling. No visible canal hematoma. Disc levels:  Diffuse degenerative disc and facet disease. Upper chest: No acute findings Other: None IMPRESSION: Atrophy, chronic microvascular disease. No acute intracranial abnormality. No acute bony abnormality in the cervical spine.  Spondylosis. Electronically Signed   By: Rolm Baptise M.D.   On: 07/12/2019 21:02   Dg Chest Portable 1 View  Result Date: 07/12/2019 CLINICAL DATA:  Evaluate for aspiration pneumonia  EXAM: PORTABLE CHEST 1 VIEW COMPARISON:  10/09/2017 FINDINGS: Heart is upper limits normal in size. Lungs clear. No effusions. No acute bony abnormality. IMPRESSION: No active disease. Electronically Signed   By: Charlett Nose M.D.   On: 07/12/2019 20:25   EKG reported above in HPI   Assessment & Plan:    Active Problems:   Acute metabolic encephalopathy   1. Acute metabolic encephalopathy 1. Patient found down for unknown length of time 2. Unclear etiology of encephalopathy 3. Sepsis is on the differential due to WBC 13, HR 102, and hypothermia - Covered with vanc and levaquin (penicillin allergy). No specific source suspected at this time. Continue antibiotic therapy for now. 4. Withdrawal - also on the differential. Patient has history of EtOH and heroin abuse. CIWA protocol. On this same note - Wernicke's is on the differential - start thiamine 5. Acidosis - also on the differential. Patient has bicarb of 15, gap 25,  lactic acid of 3.4. VBG pending.  6. No visible trauma. CT head ruled out bleed.  7. Rule out CVA with MRI Brain 8. Hypoxia - patient was hypoxic at 68% EMS arrival to her home. VBG pending for more accurate O2 sats. She's too cold at the moment for pulse ox to get a good reading. 9. TSH pending 10. Tylenol and aspirin levels normal 2. Polysubstance abuse 1. Patient has history of polysubstance abuse 2. Daughter reports a relapse to EtOH 3. CIWA protocol ordered 4. Thiamine supplementation ordered.  3. Hypothermia 1. TSH pending 2. MRI brain to r/o CVA 3. Likely environmental - patient found down for unknown length of time 4. Lactic acidosis 1. Likely secondary to poor perfusion secondary to dehydration 2. 64ml/kg bolus between EMS and ED 3. 1 L banana bag also ordered 4. LR to start after that 5. Anion Gap metabolic acidosis 1. Likely secondary to lactic acidosis 2. EtOH < 10 3. Starvation ketoacidosis possible - Urine ketones 80 4. Carbon monoxide poisoning is less likely from physical exam findings but CO test pending. 6. Hypokalemia 1. Replace and recheck 7. Hypovolemic hypernatremia 1. Recheck after fluid boluses.  2. If no correction with rehydration, consider hypotonic fluids 8. Yeast Vaginitis 1. Nystatin cream for now. Patient is too obtunded to take PO diflucan 9. Right Bed sore 1. Wound nurse consult 10. Protein calorie malnutrition 1. When patient is able to take PO - encourage nutrient dense foods.  11. Afib        1. Likely secondary everything listed above        2. Has-bled score of 4 points indicating that alternatives to anticoagulation should be         considered.    DVT Prophylaxis-   Heparin and SCDs  AM Labs Ordered, also please review Full Orders  Family Communication: Admission, patients condition and plan of care including tests being ordered have been discussed patient's daughter Dia Crawford who indicates understanding and agrees with the plan  and Code Status.  Code Status:  FULL  Admission status: Inpatient: Based on patients clinical presentation and evaluation of above clinical data, I have made determination that patient meets Inpatient criteria at this time.  Time spent in minutes : 65   Lilyan Gilford M.D on 07/12/2019 at 10:23 PM

## 2019-07-13 ENCOUNTER — Inpatient Hospital Stay (HOSPITAL_COMMUNITY): Payer: Medicare Other

## 2019-07-13 DIAGNOSIS — N289 Disorder of kidney and ureter, unspecified: Secondary | ICD-10-CM | POA: Insufficient documentation

## 2019-07-13 DIAGNOSIS — T68XXXA Hypothermia, initial encounter: Secondary | ICD-10-CM

## 2019-07-13 DIAGNOSIS — A419 Sepsis, unspecified organism: Principal | ICD-10-CM

## 2019-07-13 DIAGNOSIS — G9341 Metabolic encephalopathy: Secondary | ICD-10-CM

## 2019-07-13 DIAGNOSIS — R4182 Altered mental status, unspecified: Secondary | ICD-10-CM | POA: Insufficient documentation

## 2019-07-13 DIAGNOSIS — R6521 Severe sepsis with septic shock: Secondary | ICD-10-CM

## 2019-07-13 DIAGNOSIS — G9340 Encephalopathy, unspecified: Secondary | ICD-10-CM

## 2019-07-13 DIAGNOSIS — I340 Nonrheumatic mitral (valve) insufficiency: Secondary | ICD-10-CM

## 2019-07-13 DIAGNOSIS — I959 Hypotension, unspecified: Secondary | ICD-10-CM | POA: Insufficient documentation

## 2019-07-13 DIAGNOSIS — E870 Hyperosmolality and hypernatremia: Secondary | ICD-10-CM | POA: Insufficient documentation

## 2019-07-13 DIAGNOSIS — J9601 Acute respiratory failure with hypoxia: Secondary | ICD-10-CM | POA: Insufficient documentation

## 2019-07-13 DIAGNOSIS — R6510 Systemic inflammatory response syndrome (SIRS) of non-infectious origin without acute organ dysfunction: Secondary | ICD-10-CM

## 2019-07-13 LAB — POCT I-STAT 7, (LYTES, BLD GAS, ICA,H+H)
Acid-base deficit: 10 mmol/L — ABNORMAL HIGH (ref 0.0–2.0)
Bicarbonate: 14.8 mmol/L — ABNORMAL LOW (ref 20.0–28.0)
Calcium, Ion: 1.28 mmol/L (ref 1.15–1.40)
HCT: 33 % — ABNORMAL LOW (ref 36.0–46.0)
Hemoglobin: 11.2 g/dL — ABNORMAL LOW (ref 12.0–15.0)
O2 Saturation: 98 %
Patient temperature: 97.6
Potassium: 2.8 mmol/L — ABNORMAL LOW (ref 3.5–5.1)
Sodium: 147 mmol/L — ABNORMAL HIGH (ref 135–145)
TCO2: 16 mmol/L — ABNORMAL LOW (ref 22–32)
pCO2 arterial: 29.2 mmHg — ABNORMAL LOW (ref 32.0–48.0)
pH, Arterial: 7.31 — ABNORMAL LOW (ref 7.350–7.450)
pO2, Arterial: 102 mmHg (ref 83.0–108.0)

## 2019-07-13 LAB — URINALYSIS, ROUTINE W REFLEX MICROSCOPIC
Glucose, UA: 50 mg/dL — AB
Hgb urine dipstick: NEGATIVE
Ketones, ur: NEGATIVE mg/dL
Leukocytes,Ua: NEGATIVE
Nitrite: NEGATIVE
Protein, ur: 30 mg/dL — AB
Specific Gravity, Urine: 1.025 (ref 1.005–1.030)
pH: 5 (ref 5.0–8.0)

## 2019-07-13 LAB — ECHOCARDIOGRAM COMPLETE
Height: 64 in
Weight: 2225.76 oz

## 2019-07-13 LAB — CBC
HCT: 43.6 % (ref 36.0–46.0)
Hemoglobin: 14.8 g/dL (ref 12.0–15.0)
MCH: 35 pg — ABNORMAL HIGH (ref 26.0–34.0)
MCHC: 33.9 g/dL (ref 30.0–36.0)
MCV: 103.1 fL — ABNORMAL HIGH (ref 80.0–100.0)
Platelets: 127 10*3/uL — ABNORMAL LOW (ref 150–400)
RBC: 4.23 MIL/uL (ref 3.87–5.11)
RDW: 13 % (ref 11.5–15.5)
WBC: 11.1 10*3/uL — ABNORMAL HIGH (ref 4.0–10.5)
nRBC: 4.1 % — ABNORMAL HIGH (ref 0.0–0.2)

## 2019-07-13 LAB — T4, FREE: Free T4: 1.13 ng/dL — ABNORMAL HIGH (ref 0.61–1.12)

## 2019-07-13 LAB — COMPREHENSIVE METABOLIC PANEL
ALT: 33 U/L (ref 0–44)
AST: 45 U/L — ABNORMAL HIGH (ref 15–41)
Albumin: 2.1 g/dL — ABNORMAL LOW (ref 3.5–5.0)
Alkaline Phosphatase: 46 U/L (ref 38–126)
Anion gap: 12 (ref 5–15)
BUN: 39 mg/dL — ABNORMAL HIGH (ref 8–23)
CO2: 17 mmol/L — ABNORMAL LOW (ref 22–32)
Calcium: 7.8 mg/dL — ABNORMAL LOW (ref 8.9–10.3)
Chloride: 119 mmol/L — ABNORMAL HIGH (ref 98–111)
Creatinine, Ser: 1.16 mg/dL — ABNORMAL HIGH (ref 0.44–1.00)
GFR calc Af Amer: 56 mL/min — ABNORMAL LOW (ref >60)
GFR calc non Af Amer: 48 mL/min — ABNORMAL LOW (ref >60)
Glucose, Bld: 81 mg/dL (ref 70–99)
Potassium: 2.9 mmol/L — ABNORMAL LOW (ref 3.5–5.1)
Sodium: 148 mmol/L — ABNORMAL HIGH (ref 135–145)
Total Bilirubin: 1.7 mg/dL — ABNORMAL HIGH (ref 0.3–1.2)
Total Protein: 3.8 g/dL — ABNORMAL LOW (ref 6.5–8.1)

## 2019-07-13 LAB — CORTISOL-AM, BLOOD: Cortisol - AM: 27.3 ug/dL — ABNORMAL HIGH (ref 6.7–22.6)

## 2019-07-13 LAB — MAGNESIUM: Magnesium: 1.6 mg/dL — ABNORMAL LOW (ref 1.7–2.4)

## 2019-07-13 LAB — LACTIC ACID, PLASMA
Lactic Acid, Venous: 2.3 mmol/L (ref 0.5–1.9)
Lactic Acid, Venous: 1.6 mmol/L (ref 0.5–1.9)

## 2019-07-13 LAB — ACETAMINOPHEN LEVEL: Acetaminophen (Tylenol), Serum: <10 ug/mL — ABNORMAL LOW (ref 10–30)

## 2019-07-13 LAB — HEPARIN LEVEL (UNFRACTIONATED): Heparin Unfractionated: 0.31 IU/mL (ref 0.30–0.70)

## 2019-07-13 LAB — SARS CORONAVIRUS 2 (TAT 6-24 HRS): SARS Coronavirus 2: NEGATIVE

## 2019-07-13 LAB — MRSA PCR SCREENING: MRSA by PCR: NEGATIVE

## 2019-07-13 LAB — CBG MONITORING, ED: Glucose-Capillary: 110 mg/dL — ABNORMAL HIGH (ref 70–99)

## 2019-07-13 LAB — HIV ANTIBODY (ROUTINE TESTING W REFLEX): HIV Screen 4th Generation wRfx: NONREACTIVE

## 2019-07-13 LAB — TSH: TSH: 3.102 u[IU]/mL (ref 0.350–4.500)

## 2019-07-13 LAB — PHOSPHORUS: Phosphorus: 2.8 mg/dL (ref 2.5–4.6)

## 2019-07-13 LAB — CK: Total CK: 555 U/L — ABNORMAL HIGH (ref 38–234)

## 2019-07-13 MED ORDER — ROCURONIUM BROMIDE 50 MG/5ML IV SOLN
INTRAVENOUS | Status: AC | PRN
  Administered 2019-07-13: 6 mg via INTRAVENOUS

## 2019-07-13 MED ORDER — ONDANSETRON HCL 4 MG PO TABS: 4.0000 mg | ORAL_TABLET | Freq: Four times a day (QID) | ORAL | Status: DC | PRN

## 2019-07-13 MED ORDER — ORAL CARE MOUTH RINSE
15.0000 mL | OROMUCOSAL | Status: DC
  Administered 2019-07-14 – 2019-07-16 (×26): 15 mL via OROMUCOSAL

## 2019-07-13 MED ORDER — DILTIAZEM HCL 25 MG/5ML IV SOLN
5.0000 mg | Freq: Once | INTRAVENOUS | Status: AC
  Administered 2019-07-13: 5 mg via INTRAVENOUS
  Filled 2019-07-13: qty 5

## 2019-07-13 MED ORDER — FENTANYL CITRATE (PF) 100 MCG/2ML IJ SOLN
25.0000 ug | INTRAMUSCULAR | Status: DC | PRN
  Administered 2019-07-13: 50 ug via INTRAVENOUS
  Administered 2019-07-13 – 2019-07-15 (×8): 100 ug via INTRAVENOUS
  Administered 2019-07-16 (×2): 75 ug via INTRAVENOUS
  Administered 2019-07-16: 100 ug via INTRAVENOUS
  Administered 2019-07-16: 75 ug via INTRAVENOUS
  Filled 2019-07-13 (×14): qty 2

## 2019-07-13 MED ORDER — CHLORHEXIDINE GLUCONATE 0.12% ORAL RINSE (MEDLINE KIT)
15.0000 mL | Freq: Two times a day (BID) | OROMUCOSAL | Status: DC
  Administered 2019-07-13 – 2019-07-16 (×6): 15 mL via OROMUCOSAL

## 2019-07-13 MED ORDER — LACTATED RINGERS IV BOLUS
2000.0000 mL | Freq: Once | INTRAVENOUS | Status: AC
  Administered 2019-07-13: 2000 mL via INTRAVENOUS

## 2019-07-13 MED ORDER — MIDAZOLAM HCL 2 MG/2ML IJ SOLN
1.0000 mg | INTRAMUSCULAR | Status: DC | PRN
  Administered 2019-07-13: 1 mg via INTRAVENOUS
  Filled 2019-07-13: qty 2

## 2019-07-13 MED ORDER — FENTANYL CITRATE (PF) 100 MCG/2ML IJ SOLN: 25.0000 ug | INTRAMUSCULAR | Status: DC | PRN

## 2019-07-13 MED ORDER — MIDAZOLAM HCL 2 MG/2ML IJ SOLN: 2.0000 mg | Freq: Once | INTRAMUSCULAR | Status: DC

## 2019-07-13 MED ORDER — HEPARIN SODIUM (PORCINE) 5000 UNIT/ML IJ SOLN: 5000.0000 [IU] | Freq: Three times a day (TID) | INTRAMUSCULAR | Status: DC

## 2019-07-13 MED ORDER — LEVOFLOXACIN IN D5W 750 MG/150ML IV SOLN: 750.0000 mg | INTRAVENOUS | Status: DC

## 2019-07-13 MED ORDER — NYSTATIN 100000 UNIT/GM EX CREA
TOPICAL_CREAM | Freq: Two times a day (BID) | CUTANEOUS | Status: DC
  Administered 2019-07-13: 12:00:00 via TOPICAL
  Administered 2019-07-13: 1 via TOPICAL
  Administered 2019-07-14: 09:00:00 via TOPICAL
  Administered 2019-07-14: 1 via TOPICAL
  Administered 2019-07-15 – 2019-07-19 (×10): via TOPICAL
  Administered 2019-07-20: 1 via TOPICAL
  Administered 2019-07-20 – 2019-07-21 (×2): via TOPICAL
  Administered 2019-07-21: 1 via TOPICAL
  Administered 2019-07-22 – 2019-07-26 (×9): via TOPICAL
  Filled 2019-07-13 (×2): qty 15

## 2019-07-13 MED ORDER — POTASSIUM CHLORIDE 10 MEQ/100ML IV SOLN: 10.0000 meq | INTRAVENOUS | Status: DC

## 2019-07-13 MED ORDER — MIDAZOLAM HCL 5 MG/5ML IJ SOLN: INTRAMUSCULAR | Status: AC | PRN

## 2019-07-13 MED ORDER — MIDAZOLAM HCL 2 MG/2ML IJ SOLN
1.0000 mg | INTRAMUSCULAR | Status: DC | PRN
  Administered 2019-07-13: 1 mg via INTRAVENOUS
  Filled 2019-07-13 (×2): qty 2

## 2019-07-13 MED ORDER — ONDANSETRON HCL 4 MG/2ML IJ SOLN: 4.0000 mg | Freq: Four times a day (QID) | INTRAMUSCULAR | Status: DC | PRN

## 2019-07-13 MED ORDER — PANTOPRAZOLE SODIUM 40 MG IV SOLR
40.0000 mg | Freq: Every day | INTRAVENOUS | Status: DC
  Administered 2019-07-13 – 2019-07-17 (×5): 40 mg via INTRAVENOUS
  Filled 2019-07-13 (×6): qty 40

## 2019-07-13 MED ORDER — LABETALOL HCL 5 MG/ML IV SOLN: 10.0000 mg | INTRAVENOUS | Status: DC | PRN

## 2019-07-13 MED ORDER — DEXTROSE 5 % AND 0.45 % NACL IV BOLUS
1000.0000 mL | Freq: Once | INTRAVENOUS | Status: AC
  Administered 2019-07-13: 1000 mL via INTRAVENOUS

## 2019-07-13 MED ORDER — THIAMINE HCL 100 MG/ML IJ SOLN
100.0000 mg | Freq: Every day | INTRAMUSCULAR | Status: DC
  Administered 2019-07-14 – 2019-07-18 (×5): 100 mg via INTRAVENOUS
  Filled 2019-07-13 (×5): qty 2

## 2019-07-13 MED ORDER — MIDAZOLAM HCL 2 MG/2ML IJ SOLN
INTRAMUSCULAR | Status: AC
  Administered 2019-07-13: 2 mg
  Filled 2019-07-13: qty 2

## 2019-07-13 MED ORDER — HEPARIN SODIUM (PORCINE) 5000 UNIT/ML IJ SOLN
5000.0000 [IU] | Freq: Three times a day (TID) | INTRAMUSCULAR | Status: DC
  Administered 2019-07-13: 5000 [IU] via SUBCUTANEOUS
  Filled 2019-07-13: qty 1

## 2019-07-13 MED ORDER — FOLIC ACID 5 MG/ML IJ SOLN
1.0000 mg | Freq: Every day | INTRAMUSCULAR | Status: DC
  Administered 2019-07-13 – 2019-07-18 (×6): 1 mg via INTRAVENOUS
  Filled 2019-07-13 (×6): qty 0.2

## 2019-07-13 MED ORDER — METOPROLOL TARTRATE 5 MG/5ML IV SOLN
5.0000 mg | Freq: Once | INTRAVENOUS | Status: AC
  Administered 2019-07-13: 5 mg via INTRAVENOUS
  Filled 2019-07-13: qty 5

## 2019-07-13 MED ORDER — NOREPINEPHRINE 4 MG/250ML-% IV SOLN
0.0000 ug/min | INTRAVENOUS | Status: DC
  Administered 2019-07-13: 2 ug/min via INTRAVENOUS
  Administered 2019-07-13: 8 ug/min via INTRAVENOUS
  Administered 2019-07-14: 10 ug/min via INTRAVENOUS
  Administered 2019-07-14: 15 ug/min via INTRAVENOUS
  Administered 2019-07-14: 6 ug/min via INTRAVENOUS
  Administered 2019-07-16 (×2): 2 ug/min via INTRAVENOUS
  Filled 2019-07-13 (×6): qty 250

## 2019-07-13 MED ORDER — LACTATED RINGERS IV SOLN
INTRAVENOUS | Status: DC
  Administered 2019-07-13 – 2019-07-14 (×2): via INTRAVENOUS

## 2019-07-13 MED ORDER — GERHARDT'S BUTT CREAM
TOPICAL_CREAM | Freq: Three times a day (TID) | CUTANEOUS | Status: DC
  Administered 2019-07-14 – 2019-07-16 (×9): via TOPICAL
  Administered 2019-07-17: 1 via TOPICAL
  Administered 2019-07-17 – 2019-07-18 (×5): via TOPICAL
  Administered 2019-07-19 (×2): 1 via TOPICAL
  Administered 2019-07-19 – 2019-07-20 (×2): via TOPICAL
  Administered 2019-07-20 – 2019-07-21 (×3): 1 via TOPICAL
  Administered 2019-07-21 – 2019-07-23 (×8): via TOPICAL
  Administered 2019-07-24: 1 via TOPICAL
  Administered 2019-07-24 – 2019-07-26 (×6): via TOPICAL
  Filled 2019-07-13 (×3): qty 1

## 2019-07-13 MED ORDER — MYCOPHENOLATE 200 MG/ML ORAL SUSPENSION
500.0000 mg | Freq: Two times a day (BID) | ORAL | Status: DC
  Administered 2019-07-13 – 2019-07-18 (×11): 500 mg
  Filled 2019-07-13 (×17): qty 10

## 2019-07-13 MED ORDER — POTASSIUM CHLORIDE 20 MEQ/15ML (10%) PO SOLN
40.0000 meq | ORAL | Status: AC
  Administered 2019-07-13 (×2): 40 meq
  Filled 2019-07-13 (×2): qty 30

## 2019-07-13 MED ORDER — HEPARIN (PORCINE) 25000 UT/250ML-% IV SOLN
1400.0000 [IU]/h | INTRAVENOUS | Status: DC
  Administered 2019-07-13 – 2019-07-15 (×2): 950 [IU]/h via INTRAVENOUS
  Administered 2019-07-16: 1200 [IU]/h via INTRAVENOUS
  Administered 2019-07-17: 1300 [IU]/h via INTRAVENOUS
  Administered 2019-07-18: 1400 [IU]/h via INTRAVENOUS
  Filled 2019-07-13 (×6): qty 250

## 2019-07-13 MED ORDER — LORAZEPAM 2 MG/ML IJ SOLN
0.0000 mg | INTRAMUSCULAR | Status: DC
  Administered 2019-07-13 (×2): 1 mg via INTRAVENOUS
  Filled 2019-07-13: qty 1

## 2019-07-13 MED ORDER — THIAMINE HCL 100 MG/ML IJ SOLN
Freq: Once | INTRAVENOUS | Status: AC
  Administered 2019-07-13: 04:00:00 via INTRAVENOUS
  Filled 2019-07-13: qty 1000

## 2019-07-13 MED ORDER — THIAMINE HCL 100 MG/ML IJ SOLN: 100.0000 mg | Freq: Every day | INTRAMUSCULAR | Status: DC

## 2019-07-13 MED ORDER — SODIUM CHLORIDE 0.9 % IV BOLUS
500.0000 mL | Freq: Once | INTRAVENOUS | Status: AC
  Administered 2019-07-13: 500 mL via INTRAVENOUS

## 2019-07-13 MED ORDER — DILTIAZEM HCL 25 MG/5ML IV SOLN: 10.0000 mg | Freq: Once | INTRAVENOUS | Status: DC

## 2019-07-13 MED ORDER — METOPROLOL TARTRATE 5 MG/5ML IV SOLN
2.5000 mg | INTRAVENOUS | Status: AC | PRN
  Administered 2019-07-13: 2.5 mg via INTRAVENOUS
  Administered 2019-07-14 (×2): 5 mg via INTRAVENOUS
  Filled 2019-07-13 (×3): qty 5

## 2019-07-13 MED ORDER — FENTANYL CITRATE (PF) 100 MCG/2ML IJ SOLN: 50.0000 ug | Freq: Once | INTRAMUSCULAR | Status: DC

## 2019-07-13 MED ORDER — LORAZEPAM 2 MG/ML IJ SOLN: 0.0000 mg | Freq: Three times a day (TID) | INTRAMUSCULAR | Status: DC

## 2019-07-13 MED ORDER — SODIUM CHLORIDE 0.9 % IV SOLN
2.0000 g | Freq: Three times a day (TID) | INTRAVENOUS | Status: DC
  Administered 2019-07-14 – 2019-07-17 (×9): 2 g via INTRAVENOUS
  Filled 2019-07-13 (×12): qty 2

## 2019-07-13 MED ORDER — CHLORHEXIDINE GLUCONATE CLOTH 2 % EX PADS
6.0000 | MEDICATED_PAD | Freq: Every day | CUTANEOUS | Status: DC
  Administered 2019-07-13 – 2019-07-24 (×11): 6 via TOPICAL

## 2019-07-13 MED ORDER — FENTANYL CITRATE (PF) 100 MCG/2ML IJ SOLN
INTRAMUSCULAR | Status: AC
  Administered 2019-07-13: 50 ug via INTRAVENOUS
  Filled 2019-07-13: qty 4

## 2019-07-13 MED ORDER — TACROLIMUS 1 MG/ML ORAL SUSPENSION
1.0000 mg | Freq: Every evening | ORAL | Status: DC
  Administered 2019-07-14 – 2019-07-16 (×3): 1 mg via ORAL
  Filled 2019-07-13 (×5): qty 1

## 2019-07-13 MED ORDER — SODIUM CHLORIDE 0.9 % IV BOLUS
1000.0000 mL | Freq: Once | INTRAVENOUS | Status: AC
  Administered 2019-07-13: 1000 mL via INTRAVENOUS

## 2019-07-13 MED ORDER — LEVOFLOXACIN IN D5W 500 MG/100ML IV SOLN
500.0000 mg | INTRAVENOUS | Status: DC
  Administered 2019-07-13: 500 mg via INTRAVENOUS
  Filled 2019-07-13: qty 100

## 2019-07-13 MED ORDER — NOREPINEPHRINE 4 MG/250ML-% IV SOLN
INTRAVENOUS | Status: AC
  Filled 2019-07-13: qty 250

## 2019-07-13 MED ORDER — POTASSIUM CHLORIDE 20 MEQ/15ML (10%) PO SOLN
40.0000 meq | Freq: Once | ORAL | Status: AC
  Administered 2019-07-14: 40 meq
  Filled 2019-07-13: qty 30

## 2019-07-13 MED ORDER — THIAMINE HCL 100 MG/ML IJ SOLN
500.0000 mg | Freq: Every day | INTRAVENOUS | Status: DC
  Administered 2019-07-13: 500 mg via INTRAVENOUS
  Filled 2019-07-13: qty 5

## 2019-07-13 MED ORDER — METOPROLOL TARTRATE 5 MG/5ML IV SOLN
2.5000 mg | INTRAVENOUS | Status: AC | PRN
  Administered 2019-07-13 (×2): 2.5 mg via INTRAVENOUS
  Filled 2019-07-13: qty 5

## 2019-07-13 MED ORDER — TACROLIMUS 1 MG/ML ORAL SUSPENSION
2.0000 mg | Freq: Every morning | ORAL | Status: DC
  Administered 2019-07-14 – 2019-07-26 (×13): 2 mg via ORAL
  Filled 2019-07-13 (×15): qty 2

## 2019-07-13 MED ORDER — KCL IN DEXTROSE-NACL 40-5-0.45 MEQ/L-%-% IV SOLN
INTRAVENOUS | Status: DC
  Administered 2019-07-13: 10:00:00 via INTRAVENOUS
  Filled 2019-07-13: qty 1000

## 2019-07-13 MED ORDER — ETOMIDATE 2 MG/ML IV SOLN
INTRAVENOUS | Status: AC | PRN
  Administered 2019-07-13: 10 mg via INTRAVENOUS

## 2019-07-13 MED ORDER — LORAZEPAM 2 MG/ML IJ SOLN
1.0000 mg | INTRAMUSCULAR | Status: DC | PRN
  Filled 2019-07-13: qty 1

## 2019-07-13 MED ORDER — HEPARIN BOLUS VIA INFUSION
3150.0000 [IU] | Freq: Once | INTRAVENOUS | Status: AC
  Administered 2019-07-13: 3150 [IU] via INTRAVENOUS
  Filled 2019-07-13: qty 3150

## 2019-07-13 MED ORDER — MAGNESIUM SULFATE 2 GM/50ML IV SOLN
2.0000 g | Freq: Once | INTRAVENOUS | Status: AC
  Administered 2019-07-13: 2 g via INTRAVENOUS
  Filled 2019-07-13: qty 50

## 2019-07-13 MED ORDER — LORAZEPAM 1 MG PO TABS: 1.0000 mg | ORAL_TABLET | ORAL | Status: DC | PRN

## 2019-07-13 MED ORDER — VITAMIN B-1 100 MG PO TABS: 100.0000 mg | ORAL_TABLET | Freq: Every day | ORAL | Status: DC

## 2019-07-13 MED ORDER — FOLIC ACID 1 MG PO TABS: 1.0000 mg | ORAL_TABLET | Freq: Every day | ORAL | Status: DC

## 2019-07-13 MED ORDER — MIDAZOLAM HCL 2 MG/2ML IJ SOLN: 1.0000 mg | INTRAMUSCULAR | Status: DC | PRN

## 2019-07-13 NOTE — Progress Notes (Signed)
Echocardiogram 2D Echocardiogram has been performed.  Anna [name] 07/13/2019, 3:16 PM

## 2019-07-13 NOTE — Progress Notes (Signed)
Echo re-attempted at 1:45, patient undergoing central line procedure. Will wait outside room until patient is available.

## 2019-07-13 NOTE — Consult Note (Signed)
[place] Nurse wound consult note:  Reason for Consult: Deep tissue pressure injuries from being found down at home for an undetermined period of time.  Several areas of maroon/purple non blanching erythema present on admisison that are expected to evolve. Wound type:Pressure Pressure Injury POA: Yes Measurement:Per nursing flow sheet. Nursing is requested to measure later today.  The following areas are discolored maroon/purple and present with no break in the integument Right posterior thoracic chest Right buttock Left buttock near anus Left posterior thigh Wound bed:N/A Drainage (amount, consistency, odor) N/A Periwound: dry, intact Dressing procedure/placement/frequency: I will order a mattress replacement with low air loss feature and patient positioning off of supine position while in bed (turn side to side).  Silicone foam dressings are to be placed over lesions where a dressing will adhere, the lesion to the left of the rectal area will be treated with Gerhart's Butt cream since it is noted by the ED provider that there is vaginal yeast overgrowth. I will order Prevalon Boot to prevent heel pressure injury.  [place] nursing team will not follow, but will remain available to this patient, the nursing and medical teams.  Please re-consult if needed. Thanks, Maudie Flakes, MSN, RN, [place], Arther Abbott  Pager# [phone]

## 2019-07-13 NOTE — ED Provider Notes (Signed)
Called to bedside by nursing staff for patient deteriorating.  Patient admitted by hospitalist service.  Patient admitted with altered mental status and encephalopathy after being found down at home.  Does have a history of alcohol abuse and previous kidney transplant.  Became acutely hypotensive with rapid heart rate this morning.  She is obtunded, not responding other than moaning to painful stimuli.  Does wiggle fingers and toes.  Hypotensive in the 70s with rapid A. fib 120s to 140s.  Seen at bedside with hospitalist Dr. Maylene Roes.  She spoke with patient's family. patient is a full code.  Patient given additional IV fluids and started on vasopressors.  She will need airway protection.  Critical care at bedside assuming care and will intubate.  Labs reviewed without evidence of serious abnormality or source of infection.  CT head and C-spine were negative. D/w Dr. Maylene Roes and Dr. Halford Chessman.   Physical Exam  BP (!) 70/50 (BP Location: Right Arm)   Pulse (!) 107   Temp 98.5 F (36.9 C) (Oral)   Resp 19   Ht 5\' 4"  (1.626 m)   Wt 63.1 kg   SpO2 97%   BMI 23.88 kg/m     ED Course/Procedures     .Critical Care Performed by: Ezequiel Essex, MD Authorized by: Ezequiel Essex, MD   Critical care provider statement:    Critical care time (minutes):  35   Critical care was necessary to treat or prevent imminent or life-threatening deterioration of the following conditions:  Shock and sepsis   Critical care was time spent personally by me on the following activities:  Discussions with consultants, evaluation of patient's response to treatment, examination of patient, ordering and performing treatments and interventions, ordering and review of laboratory studies, ordering and review of radiographic studies, pulse oximetry, re-evaluation of patient's condition, obtaining history from patient or surrogate and review of old charts       Ezequiel Essex, MD 07/13/19 1439

## 2019-07-13 NOTE — Progress Notes (Addendum)
PROGRESS NOTE    Anna [name]  ZOX:[id number] DOB: 09-19-1950 DOA: 07/12/2019 PCP: Patient, No Pcp Per     Brief Narrative:  Anna [name] is a 68 y.o. female, with history of HTN, asthma, and polysubstance abuse, presents to the ED today via EMS. Apparently the patient had not been in contact with either daughters for 1 week. She missed her rent payment, and when the landlord went to her apartment, she didn't answer. Landlord spoke to daughter and together they decided to call the police for a wellness check. Patient was found down in her bathroom with tylenol pills all around her. She was unresponsive and EMS was called. When EMS arrived, they reported that her O2 sat was 68%, SBP was 80. They administered bolus and this raised her BP to 109/74. After they left, daughter searched the apartment and found hundreds of 1.5L wine bottles empty, as well as hard cider. Daughter reports that she has been going through the patient's online grocery orders, and she orders 6x 1.5L bottles of wine per week. Daughter suspects she is also going to the store and buying more wine in person. In the past, patient has abused Zquil and children's Dimetapp. There was no evidence of these medications in the apartment this time.   In the ED, patient placed on Bair Hugger. Code sepsis was called due to vital signs. C-collar was placed until C spine CT revealed no bony abnormalities. CT head showed atrophy, chronic microvascular disease, with no acute intracranial abnormalities. CXR showed no active disease. EtOH, tylenol, and aspirin levels normal. UDS pending. TSH pending. Blood cultures and urine culture collected. CMP showed Na 147, K +3.3, Cr 1.19. EKG shows Afib with rate of 103, QTC 410, non-specific T wave changes. Patient is admitted to progressive unit for further management and evaluation of altered mental status.   New events last 24 hours / Subjective: Patient arousable to voice but does not answer  questions or follows commands.   Assessment & Plan:   Active Problems:   Acute metabolic encephalopathy    Acute toxic metabolic encephalopathy -Unclear etiology of encephalopathy, but on the differential could be sepsis, alcohol withdrawal, Wernicke's encephalopathy, hypoxia -UDS negative on admission, acetaminophen and salicylate level negative -CT head, cervical spine negative  -MRI brain pending  -IV thiamine  SIRS, present on admission -Unclear source of infectious process at this time -Patient presented with hypothermia, tachycardia, tachypnea, lactic acidosis, hypotension as well as hypoxia and altered mental status -COVID-19 negative -CXR without active disease -Blood cultures pending -Urine culture pending -Empiric vancomycin, Levaquin   Alcohol abuse with history of polysubstance abuse -CIWA protocol  Hypernatremia -Change IV fluids to D5 half-normal  Hypokalemia -Replace, trend  Hypomagnesemia -Replace, trend  Atrial fibrillation -TSH normal -CHA2DS2-VASc 3  -HASBLED 2 -Continue telemetry  -Echo  Hypertension -Hold Norvasc and Tenormin in setting of hypotension on admission  Depression/anxiety -Hold Wellbutrin, Ativan due to altered mental status currently  Neuropathy and chronic pain -Hold Neurontin, Robaxin in setting of altered mental status  History of renal transplant -Resume CellCept, tacrolimus once mentation improves -Check tacrolimus level    DVT prophylaxis: Subq hep Code Status: Full code Family Communication: Spoke with daughter over the phone Disposition Plan: Pending clinical improvement    Consultants:   None  Procedures:   None   Antimicrobials:  Anti-infectives (From admission, onward)   Start     Dose/Rate Route Frequency Ordered Stop   07/14/19 2200  vancomycin (VANCOCIN) 1,500 mg in sodium  chloride 0.9 % 500 mL IVPB     1,500 mg 250 mL/hr over 120 Minutes Intravenous Every 48 hours 07/12/19 2229     07/12/19  2200  vancomycin (VANCOCIN) 1,500 mg in sodium chloride 0.9 % 500 mL IVPB     1,500 mg 250 mL/hr over 120 Minutes Intravenous  Once 07/12/19 2134 07/13/19 0224   07/12/19 2145  levofloxacin (LEVAQUIN) IVPB 750 mg  Status:  Discontinued     750 mg 100 mL/hr over 90 Minutes Intravenous  Once 07/12/19 2134 07/13/19 0900        Objective: Vitals:   07/13/19 0815 07/13/19 0915 07/13/19 1000 07/13/19 1006  BP: 108/68 102/67 (!) 104/57 (!) 104/57  Pulse:  73 76 (!) 50  Resp: (!) 22 18 (!) 29   Temp:      TempSrc:      SpO2:  97% 96%   Weight:      Height:        Intake/Output Summary (Last 24 hours) at 07/13/2019 1041 Last data filed at 07/13/2019 1004 Gross per 24 hour  Intake 3550 ml  Output --  Net 3550 ml   Filed Weights   07/12/19 2000  Weight: 63.1 kg    Examination:  General exam: Appears calm and comfortable, arousable to voice  Respiratory system: Clear to auscultation. Respiratory effort normal. No respiratory distress.  Cardiovascular system: S1 & S2 heard, irreg rhythm. No murmurs. No pedal edema. Gastrointestinal system: Abdomen is nondistended, soft and nontender. Normal bowel sounds heard. Central nervous system: Alert to voice but does not answer questions or follow command  Extremities: Symmetric in appearance     Data Reviewed: I have personally reviewed following labs and imaging studies  CBC: Recent Labs  Lab 07/12/19 2013 07/12/19 2249 07/13/19 0438  WBC 13.4*  --  11.1*  NEUTROABS 11.8*  --   --   HGB 18.1* 14.3 14.8  HCT 52.9* 42.0 43.6  MCV 102.5*  --  103.1*  PLT 185  --  127*   Basic Metabolic Panel: Recent Labs  Lab 07/12/19 2013 07/12/19 2249 07/13/19 0438  NA 147* 148* 148*  K 3.3* 2.6* 2.9*  CL 107  --  119*  CO2 15*  --  17*  GLUCOSE 128*  --  81  BUN 43*  --  39*  CREATININE 1.19*  --  1.16*  CALCIUM 10.5*  --  7.8*  MG  --   --  1.6*  PHOS  --   --  2.8   GFR: Estimated Creatinine Clearance: 40.1 mL/min (A) (by C-G  formula based on SCr of 1.16 mg/dL (H)). Liver Function Tests: Recent Labs  Lab 07/12/19 2013 07/13/19 0438  AST 74* 45*  ALT 56* 33  ALKPHOS 65 46  BILITOT 1.8* 1.7*  PROT 6.3* 3.8*  ALBUMIN 3.4* 2.1*   No results for input(s): LIPASE, AMYLASE in the last 168 hours. No results for input(s): AMMONIA in the last 168 hours. Coagulation Profile: Recent Labs  Lab 07/12/19 2242  INR 1.3*   Cardiac Enzymes: Recent Labs  Lab 07/12/19 2242  CKTOTAL 555*   BNP (last 3 results) No results for input(s): PROBNP in the last 8760 hours. HbA1C: No results for input(s): HGBA1C in the last 72 hours. CBG: Recent Labs  Lab 07/12/19 2026  GLUCAP 144*   Lipid Profile: No results for input(s): CHOL, HDL, LDLCALC, TRIG, CHOLHDL, LDLDIRECT in the last 72 hours. Thyroid Function Tests: Recent Labs    07/12/19 2242  TSH 3.102  FREET4 1.13*   Anemia Panel: No results for input(s): VITAMINB12, FOLATE, FERRITIN, TIBC, IRON, RETICCTPCT in the last 72 hours. Sepsis Labs: Recent Labs  Lab 07/12/19 2013 07/12/19 2141 07/13/19 0610  LATICACIDVEN 3.4* 3.3* 2.3*    Recent Results (from the past 240 hour(s))  SARS CORONAVIRUS 2 (TAT 6-24 HRS) Nasopharyngeal Nasopharyngeal Swab     Status: None   Collection Time: 07/13/19 12:48 AM   Specimen: Nasopharyngeal Swab  Result Value Ref Range Status   SARS Coronavirus 2 NEGATIVE NEGATIVE Final    Comment: (NOTE) SARS-CoV-2 target nucleic acids are NOT DETECTED. The SARS-CoV-2 RNA is generally detectable in upper and lower respiratory specimens during the acute phase of infection. Negative results do not preclude SARS-CoV-2 infection, do not rule out co-infections with other pathogens, and should not be used as the sole basis for treatment or other patient management decisions. Negative results must be combined with clinical observations, patient history, and epidemiological information. The expected result is Negative. Fact Sheet for  Patients: HairSlick.no Fact Sheet for Healthcare Providers: quierodirigir.com This test is not yet approved or cleared by the [place] FDA and  has been authorized for detection and/or diagnosis of SARS-CoV-2 by FDA under an Emergency Use Authorization (EUA). This EUA will remain  in effect (meaning this test can be used) for the duration of the COVID-19 declaration under Section 56 4(b)(1) of the Act, 21 U.S.C. section 360bbb-3(b)(1), unless the authorization is terminated or revoked sooner. Performed at [facility], [address], [place], [place] [address]       Radiology Studies: Ct Head Wo Contrast  Result Date: 07/12/2019 CLINICAL DATA:  Found on floor.  Altered level of consciousness. EXAM: CT HEAD WITHOUT CONTRAST CT CERVICAL SPINE WITHOUT CONTRAST TECHNIQUE: Multidetector CT imaging of the head and cervical spine was performed following the standard protocol without intravenous contrast. Multiplanar CT image reconstructions of the cervical spine were also generated. COMPARISON:  10/09/2017 FINDINGS: CT HEAD FINDINGS Brain: There is atrophy and chronic small vessel disease changes. No acute intracranial abnormality. Specifically, no hemorrhage, hydrocephalus, mass lesion, acute infarction, or significant intracranial injury. Vascular: No hyperdense vessel or unexpected calcification. Skull: No acute calvarial abnormality. Sinuses/Orbits: Visualized paranasal sinuses and mastoids clear. Orbital soft tissues unremarkable. Other: None CT CERVICAL SPINE FINDINGS Alignment: Normal Skull base and vertebrae: No acute fracture. No primary bone lesion or focal pathologic process. Soft tissues and spinal canal: No prevertebral fluid or swelling. No visible canal hematoma. Disc levels:  Diffuse degenerative disc and facet disease. Upper chest: No acute findings Other: None IMPRESSION: Atrophy, chronic microvascular disease. No acute  intracranial abnormality. No acute bony abnormality in the cervical spine.  Spondylosis. Electronically Signed   By: Charlett Nose M.D.   On: 07/12/2019 21:02   Ct Cervical Spine Wo Contrast  Result Date: 07/12/2019 CLINICAL DATA:  Found on floor.  Altered level of consciousness. EXAM: CT HEAD WITHOUT CONTRAST CT CERVICAL SPINE WITHOUT CONTRAST TECHNIQUE: Multidetector CT imaging of the head and cervical spine was performed following the standard protocol without intravenous contrast. Multiplanar CT image reconstructions of the cervical spine were also generated. COMPARISON:  10/09/2017 FINDINGS: CT HEAD FINDINGS Brain: There is atrophy and chronic small vessel disease changes. No acute intracranial abnormality. Specifically, no hemorrhage, hydrocephalus, mass lesion, acute infarction, or significant intracranial injury. Vascular: No hyperdense vessel or unexpected calcification. Skull: No acute calvarial abnormality. Sinuses/Orbits: Visualized paranasal sinuses and mastoids clear. Orbital soft tissues unremarkable. Other: None CT CERVICAL  SPINE FINDINGS Alignment: Normal Skull base and vertebrae: No acute fracture. No primary bone lesion or focal pathologic process. Soft tissues and spinal canal: No prevertebral fluid or swelling. No visible canal hematoma. Disc levels:  Diffuse degenerative disc and facet disease. Upper chest: No acute findings Other: None IMPRESSION: Atrophy, chronic microvascular disease. No acute intracranial abnormality. No acute bony abnormality in the cervical spine.  Spondylosis. Electronically Signed   By: Rolm Baptise M.D.   On: 07/12/2019 21:02   Dg Chest Portable 1 View  Result Date: 07/12/2019 CLINICAL DATA:  Evaluate for aspiration pneumonia EXAM: PORTABLE CHEST 1 VIEW COMPARISON:  10/09/2017 FINDINGS: Heart is upper limits normal in size. Lungs clear. No effusions. No acute bony abnormality. IMPRESSION: No active disease. Electronically Signed   By: Rolm Baptise M.D.   On:  07/12/2019 20:25      Scheduled Meds:  folic acid  1 mg Oral Daily   heparin  5,000 Units Subcutaneous Q8H   LORazepam  0-4 mg Intravenous Q4H   Followed by   Derrill Memo ON 07/15/2019] LORazepam  0-4 mg Intravenous Q8H   nystatin cream   Topical BID   thiamine  100 mg Oral Daily   Or   thiamine  100 mg Intravenous Daily   Continuous Infusions:  dextrose 5 % and 0.45 % NaCl with KCl 40 mEq/L 100 mL/hr at 07/13/19 1004   [START ON 07/14/2019] vancomycin       LOS: 1 day      Time spent: 45 minutes   Dessa Phi, DO [facility] 07/13/2019, 10:41 AM   Available via Epic secure chat 7am-7pm After these hours, please refer to coverage provider listed on [email or web address]

## 2019-07-13 NOTE — Procedures (Signed)
Central Venous Catheter Insertion Procedure Note Angell Pincock [id number] May 06, 1951  Procedure: Insertion of Central Venous Catheter Indications: Assessment of intravascular volume, Drug and/or fluid administration and Frequent blood sampling  Procedure Details Consent: Unable to obtain consent because of emergent medical necessity. Time Out: Verified patient identification, verified procedure, site/side was marked, verified correct patient position, special equipment/implants available, medications/allergies/relevent history reviewed, required imaging and test results available.  Performed  Maximum sterile technique was used including antiseptics, cap, gloves, gown, hand hygiene, mask and sheet. Skin prep: Chlorhexidine; local anesthetic administered A antimicrobial bonded/coated triple lumen catheter was placed in the right internal jugular vein using the Seldinger technique.  Evaluation Blood flow good Complications: No apparent complications Patient did tolerate procedure well. Chest X-ray ordered to verify placement.  CXR: pending.  Procedure performed under direct ultrasound guidance for real time vessel cannulation.      Montey Hora, [facility] 07/13/2019, 2:10 PM

## 2019-07-13 NOTE — Progress Notes (Signed)
eLink Physician-Brief Progress Note Patient Name: Anna [name] DOB: Aug 27, 1951 MRN: [id number]   Date of Service  07/13/2019  HPI/Events of Note  40 F history of kidney transplant on Prograf and Cellcept, polysubstance abuse found unresponsive at home with empty bottles of wine and Tylenol. Now intubated for airway protection. Tylenol level persistently less than 10.  eICU Interventions   afib RVR, unable to give amiodarone due to iodine allergy. Awaiting K level prior to giving digoxine  PRN Versed for vent sedation as well as possible seizures     Intervention Category Major Interventions: Arrhythmia - evaluation and management;Respiratory failure - evaluation and management Evaluation Type: New Patient Evaluation  Judd Lien 07/13/2019, 9:30 PM

## 2019-07-13 NOTE — Progress Notes (Signed)
  PROGRESS NOTE  Called by RN regarding patient's acute change. She is hypotensive. Order 1L IVF bolus. Patient examined with EDP also at bedside. She is unresponsive (was arousable to voice and more alert by previous exam by myself and RN) and remains in A fib but rate controlled currently.   Stat ABG Lactic acid  Recheck tylenol level Start levophed  Consult PCCM. Concerned patient will need intubation for airway protection.  Spoke with daughter in the ED with update regarding patient's decompensation. Confirmed full code status.    Dessa Phi, DO [facility] 07/13/2019, 12:54 PM  Available via Epic secure chat 7am-7pm After these hours, please refer to coverage provider listed on [email or web address]   Total critical care time: 30 minutes. Time 12:42pm-1:12pm Critical care time was exclusive of separately billable procedures and treating other patients. Critical care was necessary to treat or prevent imminent or life-threatening deterioration. Critical care was time spent personally by me on the following activities: development of treatment plan with patient and/or surrogate as well as nursing, discussions with consultants, evaluation of patient's response to treatment, examination of patient, obtaining history from patient or surrogate, ordering and performing treatments and interventions, ordering and review of laboratory studies, ordering and review of radiographic studies, pulse oximetry and re-evaluation of patient's condition.

## 2019-07-13 NOTE — Consult Note (Signed)
NAME:  Anna [name], MRN:  [id number], DOB:  [birth year], LOS: 1 ADMISSION DATE:  07/12/2019, CONSULTATION DATE:  07/13/19  REFERRING MD:  Maylene Roes  CHIEF COMPLAINT:  AMS   Brief History   Anna [name] is a 68 y.o. female who was admitted with AMS after being found down minimally responsive in her home.  Required intubation in ED 11/6.  History of present illness   Pt is encephelopathic; therefore, this HPI is obtained from chart review. Anna [name] is a 68 y.o. female who has a PMH including but not limited to asthma, HTN, renal transplant, EtOH abuse.  She presented to [facility] ED 11/5 after she was found in her home down and minimally responsive.  She had apparently not been in contact with either daughter for over a week and she then missed her rent payment.  Landlord went to her apartment but there was no answer at the door; therefore, police were called for a welfare check.  This was when she was found and it was noted that she had tylenol pills and empty alcohol bottles around her.  SpO2 was in 60's and SBP in 80's with EMS. She was brought to ED for further workup.  In ED, head CT was neg, EtOH, tylenol, ASA levels all normal.  She was noted to be in A.fib with RVR with HR in low 100's.  She was initially admitted to SDU under [facility] service; however, around lunch time on 11/6, she had worsening mental status with questionable airway protection.  PCCM was subsequently called in consultation.  Upon our arrival, it was felt that pt had poor airway protection; therefore, she was intubated.  She was also hypotensive felt to be primarily due to hypovolemia.  She was started on fluids as well as levophed.  MRI brain is pending.  Of note, daughter reports after she searched pt's apartment, she found hundreds of empty 1.5L bottles of wine.  On review of her grocery receipts, she also noted that pt had been buying 6 bottles of 1.5L wine per week.  In addition, she suspects that pt had been buying  more wine in person since she has a hx of abusing Zquil and childrens Dimetapp in the past.  Past Medical History  HTN, asthma, renal transplant (on cellcept and prograf).  Significant Hospital Events   11/5 > admit. 11/6 > intubated.  Consults:  PCCM.  Procedures:  ETT 11/6 >  R IJ CVL 11/6 >   Significant Diagnostic Tests:  CT head / C-Spine 11/6 > atrophy with chronic microvascular disease.  No acute process. MRI brain 11/6 >  Echo 11/6 >   Micro Data:  Blood 11/6 >  Sputum 11/6 >  Urine 11/6 >  SARS CoV2 11/6 >   Antimicrobials:  Vanc x 1 in ED  Interim history/subjective:  Just intubated.  Objective:  Blood pressure (!) 88/62, pulse (!) 110, temperature 98.5 F (36.9 C), temperature source Oral, resp. rate 19, height 5\' 4"  (1.626 m), weight 63.1 kg, SpO2 97 %.        Intake/Output Summary (Last 24 hours) at 07/13/2019 1328 Last data filed at 07/13/2019 1144 Gross per 24 hour  Intake 4550 ml  Output -  Net 4550 ml   Filed Weights   07/12/19 2000  Weight: 63.1 kg    Examination: General: Adult female, in NAD. Neuro: Unresponsive. HEENT: [place]/AT. Sclerae anicteric.  ETT in place. Cardiovascular: IRIR, no M/R/G.  Lungs: Respirations even and unlabored.  CTA bilaterally, No  W/R/R.  Abdomen: BS x 4, soft, NT/ND.  Musculoskeletal: No gross deformities, no edema.  Skin: Dry.  Sub-acute wound to right upper back, right buttock, right groin.  All without induration or drainage.  Skin otherwise dry and warm. Multiple tattoos noted.  Assessment & Plan:   Acute encephalopathy - unclear etiology at this point.  CT head, UDS, APAP, ASA, HIV all negative on admit.  TSH, CBG normal.  MRI brain pending. - F/u MRI brain. - Repeat APAP levels STAT. - Avoid sedating meds.  Respiratory insufficiency - due to inability to protect the airway in the setting of above. - Full vent support. - Assess ABG. - Wean as able. - Daily SBT. - Bronchial hygiene. - Follow CXR.   Hypotension - presumed primarily to hypovolemia / dehydration.  Possible component sepsis (? UTI, no other obvious source at this point). - 2L LR now then LR @ 125. - Assess CVP's, goal > 10. - Assess cortisol, start stress steroids if < 20. - Continue levophed for now, wean as able. - Empiric vanc / aztreonam for now. - Follow cultures.  A.fib RVR - no known hx. - Empiric heparin for now.  Hypokalemia. Hypomagnesemia. - 40 mEq K per tube. - 2g Mag. - Follow BMP.  AKI. Mild hypernatremia. - Continue fluids. - Follow BMP.  Hx renal transplant. - Continue preadmission cellcept and prograf. - Check levels.  Hx EtOH abuse. - Thiamine / folate.  Best Practice:  Diet: NPO. Pain/Anxiety/Delirium protocol (if indicated): fentanyl PRN / Midazolam PRN.  RASS goal 0. VAP protocol (if indicated): In place. DVT prophylaxis: SCD's / Heparin. GI prophylaxis: PPI. Glucose control: SSI if glucose consistently > 180. Mobility: Bedrest. Code Status: Full. Family Communication: Daughters updated by [facility]. Disposition: ICU.  Labs   CBC: Recent Labs  Lab 07/12/19 2013 07/12/19 2249 07/13/19 0438  WBC 13.4*  --  11.1*  NEUTROABS 11.8*  --   --   HGB 18.1* 14.3 14.8  HCT 52.9* 42.0 43.6  MCV 102.5*  --  103.1*  PLT 185  --  127*   Basic Metabolic Panel: Recent Labs  Lab 07/12/19 2013 07/12/19 2249 07/13/19 0438  NA 147* 148* 148*  K 3.3* 2.6* 2.9*  CL 107  --  119*  CO2 15*  --  17*  GLUCOSE 128*  --  81  BUN 43*  --  39*  CREATININE 1.19*  --  1.16*  CALCIUM 10.5*  --  7.8*  MG  --   --  1.6*  PHOS  --   --  2.8   GFR: Estimated Creatinine Clearance: 40.1 mL/min (A) (by C-G formula based on SCr of 1.16 mg/dL (H)). Recent Labs  Lab 07/12/19 2013 07/12/19 2141 07/13/19 0438 07/13/19 0610  WBC 13.4*  --  11.1*  --   LATICACIDVEN 3.4* 3.3*  --  2.3*   Liver Function Tests: Recent Labs  Lab 07/12/19 2013 07/13/19 0438  AST 74* 45*  ALT 56* 33  ALKPHOS 65  46  BILITOT 1.8* 1.7*  PROT 6.3* 3.8*  ALBUMIN 3.4* 2.1*   No results for input(s): LIPASE, AMYLASE in the last 168 hours. No results for input(s): AMMONIA in the last 168 hours. ABG    Component Value Date/Time   HCO3 17.8 (L) 07/12/2019 2249   TCO2 19 (L) 07/12/2019 2249   ACIDBASEDEF 5.0 (H) 07/12/2019 2249   O2SAT 94.0 07/12/2019 2249    Coagulation Profile: Recent Labs  Lab 07/12/19 2242  INR 1.3*  Cardiac Enzymes: Recent Labs  Lab 07/12/19 2242  CKTOTAL 555*   HbA1C: No results found for: HGBA1C CBG: Recent Labs  Lab 07/12/19 2026 07/13/19 1250  GLUCAP 144* 110*    Review of Systems:   Unable to obtain as pt is encephalopathic.  Past medical history  She,  has a past medical history of Asthma, Hypertension, and Renal disorder.   Surgical History    Past Surgical History:  Procedure Laterality Date  . CESAREAN SECTION    . NEPHRECTOMY TRANSPLANTED ORGAN       Social History   reports that she has never smoked. She has quit using smokeless tobacco. She reports current alcohol use. She reports current drug use.   Family history   Her family history is not on file.   Allergies Allergies  Allergen Reactions  . Iodine-131 Hives and Shortness Of Breath  . 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 Penicillin G Rash    Has patient had a PCN reaction causing immediate rash, facial/tongue/throat swelling, SOB or lightheadedness with hypotension: Yes Has patient had a PCN reaction causing severe rash involving mucus membranes or skin necrosis: Unk Has patient had a PCN reaction that required hospitalization: Unk Has patient had a PCN reaction occurring within the last 10 years: No If all of the above answers are "NO", then may proceed with Cephalosporin use.      Home meds  Prior to Admission medications   Medication Sig Start Date End Date Taking? Authorizing Provider  Albuterol Sulfate 108 (90 Base) MCG/ACT AEPB  Inhale 2 puffs into the lungs 4 (four) times daily as needed (wheezing/shortness of breath).     [provider]  amLODipine (NORVASC) 5 MG tablet Take 5 mg by mouth daily. For heart    [provider]  atenolol (TENORMIN) 25 MG tablet Take 75 mg by mouth 2 (two) times daily. For heart    [provider]  B Complex-C (B-COMPLEX WITH VITAMIN C) tablet Take 1 tablet by mouth daily.    [provider]  buPROPion (WELLBUTRIN XL) 300 MG 24 hr tablet Take 300 mg by mouth daily.    [provider]  calcium carbonate (TUMS - DOSED IN MG ELEMENTAL CALCIUM) 500 MG chewable tablet Chew 1 tablet by mouth daily as needed for indigestion or heartburn.    [provider]  cholecalciferol (VITAMIN D) 1000 units tablet Take 1,000 Units by mouth daily.    [provider]  clindamycin (CLEOCIN) 150 MG capsule Take 600 mg by mouth See admin instructions. Take four capsules (600 mg) by mouth thirty minutes before dental procedures - next appointment March 2019    [provider]  Diphenhydramine-APAP, sleep, (EXCEDRIN PM) 38-500 MG TABS Take 1-2 tablets by mouth at bedtime as needed (sleep).    [provider]  fluticasone (FLONASE) 50 MCG/ACT nasal spray Place 1 spray into both nostrils daily as needed for allergies or rhinitis.     [provider]  gabapentin (NEURONTIN) 100 MG capsule Take 100 mg by mouth 3 (three) times daily.    [provider]  ipratropium (ATROVENT) 0.03 % nasal spray Place 2 sprays into both nostrils 3 (three) times daily as needed for rhinitis.    [provider]  lidocaine (LIDODERM) 5 % Place 1 patch onto the skin every other day. Remove & Discard patch within 12 hours or as directed by MD     [provider]  LORazepam (ATIVAN) 1 MG tablet Take 1 mg by mouth 3 (three) times daily.    [provider]  methocarbamol (ROBAXIN) 500 MG tablet Take 1,000 mg by mouth 2 (two)  times daily.     [provider]  montelukast (SINGULAIR) 10 MG tablet Take 10 mg by mouth at bedtime.    [provider]  mycophenolate (CELLCEPT) 500 MG tablet Take 500 mg by mouth 2 (two) times daily.    [provider]  Naloxone HCl (EVZIO) 0.4 MG/0.4ML SOAJ Inject 0.4 mg into the muscle See admin instructions. Inject 0.4 ml (0.4 mg) intramuscularly once into the thigh as needed for opiod overdose. If no response after 2-3 minutes or person responds but relapses repeat with an additional dose using new injector device. Call 911 after use.    [provider]  Omega-3 Fatty Acids (FISH OIL) 1000 MG CAPS Take 2,000 mg by mouth 2 (two) times daily.     [provider]  OVER THE COUNTER MEDICATION Take 1,800 mg by mouth See admin instructions. CBD (30 mg Hemp/1800 mg CBD) - take one capsule by mouth twice daily    [provider]  tacrolimus (PROGRAF) 1 MG capsule Take 1-2 mg by mouth See admin instructions. Take 2 capsules (2 mg) by mouth every morning and 1 capsule (1 mg) every evening    [provider]  traZODone (DESYREL) 150 MG tablet Take 300 mg by mouth at bedtime.     [provider]    Critical care time: 60 min.    Rutherford Guysahul Janani Chamber, [place] Sidonie Dickens- [facility] 07/13/2019, 1:28 PM

## 2019-07-13 NOTE — ED Notes (Signed)
Spoke with patient's daughter and informed her pt would be transferred to [facility], daughter was in agreement in the plan.  Gave her the assigned room number at [facility].

## 2019-07-13 NOTE — Consult Note (Addendum)
[facility] Nurse wound consult note  Please place an image in the patient's chart for the right buttock "bedsore" wound. The [place] nurses are performing telehealth consultations.  A WOC Nurse will follow up with recommendations/orders once they have reviewed the patient's chart, images and potentially spoken with the bedside nurse.  Thanks, Maudie Flakes, MSN, RN, [place], Arther Abbott  Pager# [phone]

## 2019-07-13 NOTE — Procedures (Signed)
Intubation Procedure Note Rockie Vawter [id number] 1951/04/29  Procedure: Intubation Indications: Airway protection and maintenance  Procedure Details Consent: Unable to obtain consent because of altered level of consciousness. Time Out: Verified patient identification, verified procedure, site/side was marked, verified correct patient position, special equipment/implants available, medications/allergies/relevent history reviewed, required imaging and test results available.  Performed  Maximum sterile technique was used including gloves, hand hygiene and mask.  MAC 3.  Given 2 mg versed, 50 mcg fentanyl, 10 mg etomidate, 60 mg rocuronium.  Inserted 7.5 ETT to 22 cm at lip using glidescope.  Confirmed with CO2 detector and ausculation.  Noted to have yellow, pooled secretions in posterior pharynx.    Evaluation Hemodynamic Status: BP stable throughout; O2 sats: stable throughout Patient's Current Condition: stable Complications: No apparent complications Patient did tolerate procedure well. Chest X-ray ordered to verify placement.  CXR: pending.   Tayvin Preslar 07/13/2019

## 2019-07-13 NOTE — Progress Notes (Signed)
Attempted to get pt for MRI and the RN stated pt is being transferred to [facility].

## 2019-07-13 NOTE — Code Documentation (Signed)
Bag-valve mask used to preoxygenate pt

## 2019-07-13 NOTE — Progress Notes (Signed)
Echo attempted at 1:10, patient in middle of procedure.

## 2019-07-13 NOTE — ED Notes (Signed)
Echo at bedside

## 2019-07-13 NOTE — Code Documentation (Signed)
Pt intubated successfully by Dr. Halford Chessman

## 2019-07-13 NOTE — Progress Notes (Signed)
Attempted to get pt for MRI at 858pm. RN stated pt is not stable enough to come down to MRI and had just got to her room. RN was informed MRI is not onsite during the weekends and so the scan will be done Monday sometime. If scan is needed this weekend, the call tech will need to be called to come in.

## 2019-07-13 NOTE — ED Notes (Signed)
Admitting

## 2019-07-13 NOTE — Progress Notes (Signed)
ANTICOAGULATION & ANTIBIOTIC CONSULT NOTE - Initial Consult   Allergies  Allergen Reactions  . Iodine-131 Hives and Shortness Of Breath  . 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 Penicillin G Rash    Has patient had a PCN reaction causing immediate rash, facial/tongue/throat swelling, SOB or lightheadedness with hypotension: Yes Has patient had a PCN reaction causing severe rash involving mucus membranes or skin necrosis: Unk Has patient had a PCN reaction that required hospitalization: Unk Has patient had a PCN reaction occurring within the last 10 years: No If all of the above answers are "NO", then may proceed with Cephalosporin use.     Patient Measurements: Height: 5\' 4"  (162.6 cm) Weight: 139 lb 1.8 oz (63.1 kg) IBW/kg (Calculated) : 54.7 Heparin Dosing Weight: 63.1 kg   Vital Signs: Temp: 98.5 F (36.9 C) (11/06 0601) Temp Source: Oral (11/06 0601) BP: 98/74 (11/06 1345) Pulse Rate: 49 (11/06 1345)  Labs: Recent Labs    07/12/19 2013 07/12/19 2242 07/12/19 2249 07/13/19 0438  HGB 18.1*  --  14.3 14.8  HCT 52.9*  --  42.0 43.6  PLT 185  --   --  127*  APTT  --  28  --   --   LABPROT  --  15.8*  --   --   INR  --  1.3*  --   --   CREATININE 1.19*  --   --  1.16*  CKTOTAL  --  555*  --   --     Estimated Creatinine Clearance: 40.1 mL/min (A) (by C-G formula based on SCr of 1.16 mg/dL (H)).   Medical History: Past Medical History:  Diagnosis Date  . Asthma   . Hypertension   . Renal disorder     Medications:  (Not in a hospital admission)   Assessment: 68 yo female presented on 07/12/2019 when patient was found from EMS on the bathroom floor.   Pharmacy consulted to dose heparin for Afib. Patient is not on anticoagulation prior to admission. Patient is currently in Afib. CHAD2-VASc score of 3. No reported bleeding. Hgb 11.6. Plt 127.   Pharmacy consulted to dose aztreonam for sepsis. Patient was started on vancomycin  and levofloxacin on admission for sepsis. WBC 11.1. Lactic acid 2.3. Afebrile. Of note patient has a penicillin allergy noted as rash in our system and has never received a cephalosporin in our system. Clarified with MD and plan to continue with aztreonam.    Antimicrobials this admission: Vancomycin 11/5 >> 11/6  Levofloxacin x1 11/6 Aztreonam 11/6 >>  Antimicrobial adjustments this admission: N/A  Cultures this admission: 11/6 respiratory: ordered  11/5 BCx: no growth <24 hrs  11/5 UCx: collected  Goal of Therapy:  Heparin level 0.3-0.7 units/ml Monitor platelets by anticoagulation protocol: Yes   Plan:  Heparin 3150 unit bolus  Start heparin 950 units/hr  Check heparin level at 2100 on 11/6 Start aztreonam 2g IV q8h (starting 11/7 at 1130 - 24 hrs after last levofloxacin dose)   Cristela Felt, PharmD PGY1 Pharmacy Resident [employer]: [phone]   07/13/2019,2:02 PM

## 2019-07-13 NOTE — ED Notes (Signed)
RT at bedside.

## 2019-07-13 NOTE — Progress Notes (Signed)
[facility] Progress Note Patient Name: Anna [name] DOB: 05-06-1951 MRN: [id number]   Date of Service  07/13/2019  HPI/Events of Note  Informed of possible seizure episode. Im unable to remote in for this patient who is currently in the ED.  eICU Interventions  Added seizure as an indication for midazolam that's already ordered for sedation     Intervention Category Major Interventions: Seizures - evaluation and management  Judd Lien 07/13/2019, 7:42 PM

## 2019-07-14 ENCOUNTER — Inpatient Hospital Stay (HOSPITAL_COMMUNITY): Payer: Medicare Other

## 2019-07-14 DIAGNOSIS — J9600 Acute respiratory failure, unspecified whether with hypoxia or hypercapnia: Secondary | ICD-10-CM

## 2019-07-14 LAB — CBC WITH DIFFERENTIAL/PLATELET
Abs Immature Granulocytes: 0.11 10*3/uL — ABNORMAL HIGH (ref 0.00–0.07)
Basophils Absolute: 0 10*3/uL (ref 0.0–0.1)
Basophils Relative: 0 %
Eosinophils Absolute: 0 10*3/uL (ref 0.0–0.5)
Eosinophils Relative: 0 %
HCT: 38.8 % (ref 36.0–46.0)
Hemoglobin: 13.1 g/dL (ref 12.0–15.0)
Immature Granulocytes: 1 %
Lymphocytes Relative: 4 %
Lymphs Abs: 0.5 10*3/uL — ABNORMAL LOW (ref 0.7–4.0)
MCH: 35.8 pg — ABNORMAL HIGH (ref 26.0–34.0)
MCHC: 33.8 g/dL (ref 30.0–36.0)
MCV: 106 fL — ABNORMAL HIGH (ref 80.0–100.0)
Monocytes Absolute: 0.7 10*3/uL (ref 0.1–1.0)
Monocytes Relative: 5 %
Neutro Abs: 13 10*3/uL — ABNORMAL HIGH (ref 1.7–7.7)
Neutrophils Relative %: 90 %
Platelets: 157 10*3/uL (ref 150–400)
RBC: 3.66 MIL/uL — ABNORMAL LOW (ref 3.87–5.11)
RDW: 13.5 % (ref 11.5–15.5)
WBC: 14.4 10*3/uL — ABNORMAL HIGH (ref 4.0–10.5)
nRBC: 4.9 % — ABNORMAL HIGH (ref 0.0–0.2)

## 2019-07-14 LAB — BASIC METABOLIC PANEL
Anion gap: 4 — ABNORMAL LOW (ref 5–15)
BUN: 26 mg/dL — ABNORMAL HIGH (ref 8–23)
CO2: 17 mmol/L — ABNORMAL LOW (ref 22–32)
Calcium: 8.3 mg/dL — ABNORMAL LOW (ref 8.9–10.3)
Chloride: 123 mmol/L — ABNORMAL HIGH (ref 98–111)
Creatinine, Ser: 0.97 mg/dL (ref 0.44–1.00)
GFR calc Af Amer: >60 mL/min (ref >60)
GFR calc non Af Amer: >60 mL/min (ref >60)
Glucose, Bld: 152 mg/dL — ABNORMAL HIGH (ref 70–99)
Potassium: 4.5 mmol/L (ref 3.5–5.1)
Sodium: 144 mmol/L (ref 135–145)

## 2019-07-14 LAB — HEPATIC FUNCTION PANEL
ALT: 25 U/L (ref 0–44)
AST: 33 U/L (ref 15–41)
Albumin: 2 g/dL — ABNORMAL LOW (ref 3.5–5.0)
Alkaline Phosphatase: 46 U/L (ref 38–126)
Bilirubin, Direct: 0.4 mg/dL — ABNORMAL HIGH (ref 0.0–0.2)
Indirect Bilirubin: 1.1 mg/dL — ABNORMAL HIGH (ref 0.3–0.9)
Total Bilirubin: 1.5 mg/dL — ABNORMAL HIGH (ref 0.3–1.2)
Total Protein: 4.1 g/dL — ABNORMAL LOW (ref 6.5–8.1)

## 2019-07-14 LAB — PHOSPHORUS
Phosphorus: 1.8 mg/dL — ABNORMAL LOW (ref 2.5–4.6)
Phosphorus: 1.6 mg/dL — ABNORMAL LOW (ref 2.5–4.6)
Phosphorus: 2.6 mg/dL (ref 2.5–4.6)

## 2019-07-14 LAB — GLUCOSE, CAPILLARY
Glucose-Capillary: 113 mg/dL — ABNORMAL HIGH (ref 70–99)
Glucose-Capillary: 131 mg/dL — ABNORMAL HIGH (ref 70–99)
Glucose-Capillary: 172 mg/dL — ABNORMAL HIGH (ref 70–99)

## 2019-07-14 LAB — COMPREHENSIVE METABOLIC PANEL
ALT: 31 U/L (ref 0–44)
AST: 42 U/L — ABNORMAL HIGH (ref 15–41)
Albumin: 2.3 g/dL — ABNORMAL LOW (ref 3.5–5.0)
Alkaline Phosphatase: 53 U/L (ref 38–126)
Anion gap: 8 (ref 5–15)
BUN: 31 mg/dL — ABNORMAL HIGH (ref 8–23)
CO2: 18 mmol/L — ABNORMAL LOW (ref 22–32)
Calcium: 8.6 mg/dL — ABNORMAL LOW (ref 8.9–10.3)
Chloride: 122 mmol/L — ABNORMAL HIGH (ref 98–111)
Creatinine, Ser: 0.92 mg/dL (ref 0.44–1.00)
GFR calc Af Amer: >60 mL/min (ref >60)
GFR calc non Af Amer: >60 mL/min (ref >60)
Glucose, Bld: 120 mg/dL — ABNORMAL HIGH (ref 70–99)
Potassium: 4.6 mmol/L (ref 3.5–5.1)
Sodium: 148 mmol/L — ABNORMAL HIGH (ref 135–145)
Total Bilirubin: 1.4 mg/dL — ABNORMAL HIGH (ref 0.3–1.2)
Total Protein: 4.5 g/dL — ABNORMAL LOW (ref 6.5–8.1)

## 2019-07-14 LAB — HEPARIN LEVEL (UNFRACTIONATED): Heparin Unfractionated: 0.62 IU/mL (ref 0.30–0.70)

## 2019-07-14 LAB — BLOOD GAS, ARTERIAL
Acid-base deficit: 6.4 mmol/L — ABNORMAL HIGH (ref 0.0–2.0)
Bicarbonate: 16.7 mmol/L — ABNORMAL LOW (ref 20.0–28.0)
Drawn by: 422461
FIO2: 40
MECHVT: 430 mL
O2 Saturation: 98.8 %
PEEP: 5 cmH2O
Patient temperature: 99.7
RATE: 16 resp/min
pCO2 arterial: 29.2 mmHg — ABNORMAL LOW (ref 32.0–48.0)
pH, Arterial: 7.379 (ref 7.350–7.450)
pO2, Arterial: 155 mmHg — ABNORMAL HIGH (ref 83.0–108.0)

## 2019-07-14 LAB — MAGNESIUM
Magnesium: 2.1 mg/dL (ref 1.7–2.4)
Magnesium: 1.9 mg/dL (ref 1.7–2.4)
Magnesium: 1.7 mg/dL (ref 1.7–2.4)

## 2019-07-14 LAB — URINE CULTURE: Culture: NO GROWTH

## 2019-07-14 LAB — PROCALCITONIN: Procalcitonin: 0.3 ng/mL

## 2019-07-14 LAB — TROPONIN I (HIGH SENSITIVITY): Troponin I (High Sensitivity): 54 ng/L — ABNORMAL HIGH (ref <18)

## 2019-07-14 LAB — PATHOLOGIST SMEAR REVIEW

## 2019-07-14 MED ORDER — VITAL HIGH PROTEIN PO LIQD
1000.0000 mL | ORAL | Status: AC
  Administered 2019-07-14: 1000 mL

## 2019-07-14 MED ORDER — LACTATED RINGERS IV BOLUS
2000.0000 mL | Freq: Once | INTRAVENOUS | Status: AC
  Administered 2019-07-14: 2000 mL via INTRAVENOUS

## 2019-07-14 MED ORDER — DEXTROSE IN LACTATED RINGERS 5 % IV SOLN
INTRAVENOUS | Status: DC
  Administered 2019-07-14: 15:00:00 via INTRAVENOUS

## 2019-07-14 MED ORDER — METOPROLOL TARTRATE 5 MG/5ML IV SOLN
2.5000 mg | INTRAVENOUS | Status: DC | PRN
  Administered 2019-07-14 (×2): 5 mg via INTRAVENOUS
  Filled 2019-07-14: qty 5

## 2019-07-14 MED ORDER — VANCOMYCIN HCL 10 G IV SOLR
1500.0000 mg | INTRAVENOUS | Status: DC
  Filled 2019-07-14: qty 1500

## 2019-07-14 MED ORDER — DEXMEDETOMIDINE HCL IN NACL 200 MCG/50ML IV SOLN
0.4000 ug/kg/h | INTRAVENOUS | Status: DC
  Administered 2019-07-14: 0.4 ug/kg/h via INTRAVENOUS
  Administered 2019-07-15: 0.5 ug/kg/h via INTRAVENOUS
  Administered 2019-07-15 – 2019-07-16 (×2): 0.4 ug/kg/h via INTRAVENOUS
  Filled 2019-07-14 (×7): qty 50

## 2019-07-14 MED ORDER — SODIUM PHOSPHATES 45 MMOLE/15ML IV SOLN
20.0000 mmol | Freq: Once | INTRAVENOUS | Status: AC
  Administered 2019-07-14: 20 mmol via INTRAVENOUS
  Filled 2019-07-14 (×2): qty 6.67

## 2019-07-14 MED ORDER — PRO-STAT SUGAR FREE PO LIQD
30.0000 mL | Freq: Two times a day (BID) | ORAL | Status: DC
  Administered 2019-07-14 – 2019-07-15 (×3): 30 mL
  Filled 2019-07-14 (×4): qty 30

## 2019-07-14 MED ORDER — VASOPRESSIN 20 UNIT/ML IV SOLN
0.0300 [IU]/min | INTRAVENOUS | Status: DC
  Administered 2019-07-14 – 2019-07-16 (×4): 0.03 [IU]/min via INTRAVENOUS
  Filled 2019-07-14 (×6): qty 2

## 2019-07-14 NOTE — Consult Note (Addendum)
NAME:  Anna [name], MRN:  [id number], DOB:  10-12-1950, LOS: 2 ADMISSION DATE:  07/12/2019, CONSULTATION DATE:  07/13/19  REFERRING MD:  Alvino Chapel  CHIEF COMPLAINT:  AMS   Brief History   Anna [name] is a 68 y.o. female who has a PMH including but not limited to asthma, HTN, renal transplant, EtOH abuse.  She presented to [facility] ED 11/5 after she was found in her home down and minimally responsive.  She had apparently not been in contact with either daughter for over a week and she then missed her rent payment.  Landlord went to her apartment but there was no answer at the door; therefore, police were called for a welfare check.  This was when she was found and it was noted that she had tylenol pills and empty alcohol bottles around her.  SpO2 was in 60's and SBP in 80's with EMS. She was brought to ED for further workup.  In ED, head CT was neg, EtOH, tylenol, ASA levels all normal.  She was noted to be in A.fib with RVR with HR in low 100's.  She was initially admitted to SDU under [facility] service; however, around lunch time on 11/6, she had worsening mental status with questionable airway protection.  PCCM was subsequently called in consultation.  Upon our arrival, it was felt that pt had poor airway protection; therefore, she was intubated.  She was also hypotensive felt to be primarily due to hypovolemia.  She was started on fluids as well as levophed.  MRI brain is pending.  Of note, daughter reports after she searched pt's apartment, she found hundreds of empty 1.5L bottles of wine.  On review of her grocery receipts, she also noted that pt had been buying 6 bottles of 1.5L wine per week.  In addition, she suspects that pt had been buying more wine in person since she has a hx of abusing Zquil and childrens Dimetapp in the past.  Past Medical History  HTN, asthma, renal transplant (on cellcept and prograf).   has a past medical history of Asthma, Hypertension, and Renal disorder.   has a  past surgical history that includes Nephrectomy transplanted organ and Cesarean section.   Significant Hospital Events   11/5 > admit. 11/6 > intubated.  Consults:  PCCM.  Procedures:  ETT 11/6 >  R IJ CVL 11/6 >   Significant Diagnostic Tests:  CT head / C-Spine 11/6 > atrophy with chronic microvascular disease.  No acute process. MRI brain 11/6 >  Echo 11/6 >  Ef 55% U tox and blood tox 11/5 2020 - neg  Micro Data:  Blood 11/6 >  Sputum 11/6 >  Urine 11/6 >  SARS CoV2 11/6 >   Antimicrobials:  Vanc x 1 in ED  Interim history/subjective:    07/14/2019 -  Per RN who d/with Daughter Darl Pikes - poatient drinking 6 x 1.5L of wine per week., Last seen/heard 1 week perior to admission. On day of admit - wellness check revelaed unconscious patients surrounded by lot of bottles of empty wine, hypotensive and hypoxemic. Currently - fio2 40% on vent, on levophed -> , in A Fib RVR with IV heparin but no amio/dig only lopressor prn.   Low Ur Op but creat better  Improving responsiveness - not on sedation  Echo with ef 55%  Objective:  Blood pressure 107/65, pulse (!) 54, temperature 99.4 F (37.4 C), temperature source Axillary, resp. rate 19, height 5\' 4"  (1.626 m), weight 72  kg, SpO2 100 %. CVP:  [1 mmHg-11 mmHg] 5 mmHg  Vent Mode: PRVC FiO2 (%):  [40 %] 40 % Set Rate:  [16 bmp] 16 bmp Vt Set:  [430 mL] 430 mL PEEP:  [5 cmH20] 5 cmH20 Plateau Pressure:  [15 cmH20-17 cmH20] 15 cmH20   Intake/Output Summary (Last 24 hours) at 07/14/2019 1051 Last data filed at 07/14/2019 0600 Gross per 24 hour  Intake 4892.75 ml  Output 575 ml  Net 4317.75 ml   Filed Weights   07/12/19 2000 07/14/19 0407  Weight: 63.1 kg 72 kg    General Appearance:  Looks criticall il Head:  Normocephalic, without obvious abnormality, atraumatic Eyes:  PERRL - yes, conjunctiva/corneas - muddy     Ears:  Normal external ear canals, both ears Nose:  G tube - no Throat:  ETT TUBE - yes , OG  tube - yes Neck:  Supple,  No enlargement/tenderness/nodules Lungs: Clear to auscultation bilaterally, Ventilator   Synchrony - yes 40% Heart:  S1 and S2 normal, no murmur, CVP - no.  Pressors - levophed. HR 140s A Fib RVR Abdomen:  Soft, no masses, no organomegaly Genitalia / Rectal:  Not done Extremities:  Extremities- intact Skin:  ntact in exposed areas with tatoots.Sub-acute wound to right upper back, right buttock, right groin.  All without induration or drainage.  Skin otherwise dry and warm. Multiple tattoos noted  . Sacral area - not examined Neurologic:  Sedation - none -> RASS - -3 . Moves all 4s - yes. CAM-ICU - unable to test . Orientation - unable to test. Tracks to voice and rub      .  LABS    PULMONARY Recent Labs  Lab 07/12/19 2249 07/13/19 1608 07/14/19 0444  PHART  --  7.310* 7.379  PCO2ART  --  29.2* 29.2*  PO2ART  --  102.0 155*  HCO3 17.8* 14.8* 16.7*  TCO2 19* 16*  --   O2SAT 94.0 98.0 98.8    CBC Recent Labs  Lab 07/12/19 2013  07/13/19 0438 07/13/19 1608 07/14/19 0200  HGB 18.1*   < > 14.8 11.2* 13.1  HCT 52.9*   < > 43.6 33.0* 38.8  WBC 13.4*  --  11.1*  --  14.4*  PLT 185  --  127*  --  157   < > = values in this interval not displayed.    COAGULATION Recent Labs  Lab 07/12/19 2242  INR 1.3*    CARDIAC  No results for input(s): TROPONINI in the last 168 hours. No results for input(s): PROBNP in the last 168 hours.   CHEMISTRY Recent Labs  Lab 07/12/19 2013 07/12/19 2249 07/13/19 0438 07/13/19 1608 07/14/19 0200  NA 147* 148* 148* 147* 148*  K 3.3* 2.6* 2.9* 2.8* 4.6  CL 107  --  119*  --  122*  CO2 15*  --  17*  --  18*  GLUCOSE 128*  --  81  --  120*  BUN 43*  --  39*  --  31*  CREATININE 1.19*  --  1.16*  --  0.92  CALCIUM 10.5*  --  7.8*  --  8.6*  MG  --   --  1.6*  --  2.1  PHOS  --   --  2.8  --  1.8*   Estimated Creatinine Clearance: 56.9 mL/min (by C-G formula based on SCr of 0.92  mg/dL).   LIVER Recent Labs  Lab 07/12/19 2013 07/12/19 2242 07/13/19 0438 07/14/19 0200  AST 74*  --  45* 42*  ALT 56*  --  33 31  ALKPHOS 65  --  46 53  BILITOT 1.8*  --  1.7* 1.4*  PROT 6.3*  --  3.8* 4.5*  ALBUMIN 3.4*  --  2.1* 2.3*  INR  --  1.3*  --   --      INFECTIOUS Recent Labs  Lab 07/12/19 2141 07/13/19 0610 07/13/19 1406  LATICACIDVEN 3.3* 2.3* 1.6     ENDOCRINE CBG (last 3)  Recent Labs    07/12/19 2026 07/13/19 1250 07/14/19 0044  GLUCAP 144* 110* 113*         IMAGING x48h  - image(s) personally visualized  -   highlighted in bold Ct Head Wo Contrast  Result Date: 07/12/2019 CLINICAL DATA:  Found on floor.  Altered level of consciousness. EXAM: CT HEAD WITHOUT CONTRAST CT CERVICAL SPINE WITHOUT CONTRAST TECHNIQUE: Multidetector CT imaging of the head and cervical spine was performed following the standard protocol without intravenous contrast. Multiplanar CT image reconstructions of the cervical spine were also generated. COMPARISON:  10/09/2017 FINDINGS: CT HEAD FINDINGS Brain: There is atrophy and chronic small vessel disease changes. No acute intracranial abnormality. Specifically, no hemorrhage, hydrocephalus, mass lesion, acute infarction, or significant intracranial injury. Vascular: No hyperdense vessel or unexpected calcification. Skull: No acute calvarial abnormality. Sinuses/Orbits: Visualized paranasal sinuses and mastoids clear. Orbital soft tissues unremarkable. Other: None CT CERVICAL SPINE FINDINGS Alignment: Normal Skull base and vertebrae: No acute fracture. No primary bone lesion or focal pathologic process. Soft tissues and spinal canal: No prevertebral fluid or swelling. No visible canal hematoma. Disc levels:  Diffuse degenerative disc and facet disease. Upper chest: No acute findings Other: None IMPRESSION: Atrophy, chronic microvascular disease. No acute intracranial abnormality. No acute bony abnormality in the cervical spine.   Spondylosis. Electronically Signed   By: Rolm Baptise M.D.   On: 07/12/2019 21:02   Ct Cervical Spine Wo Contrast  Result Date: 07/12/2019 CLINICAL DATA:  Found on floor.  Altered level of consciousness. EXAM: CT HEAD WITHOUT CONTRAST CT CERVICAL SPINE WITHOUT CONTRAST TECHNIQUE: Multidetector CT imaging of the head and cervical spine was performed following the standard protocol without intravenous contrast. Multiplanar CT image reconstructions of the cervical spine were also generated. COMPARISON:  10/09/2017 FINDINGS: CT HEAD FINDINGS Brain: There is atrophy and chronic small vessel disease changes. No acute intracranial abnormality. Specifically, no hemorrhage, hydrocephalus, mass lesion, acute infarction, or significant intracranial injury. Vascular: No hyperdense vessel or unexpected calcification. Skull: No acute calvarial abnormality. Sinuses/Orbits: Visualized paranasal sinuses and mastoids clear. Orbital soft tissues unremarkable. Other: None CT CERVICAL SPINE FINDINGS Alignment: Normal Skull base and vertebrae: No acute fracture. No primary bone lesion or focal pathologic process. Soft tissues and spinal canal: No prevertebral fluid or swelling. No visible canal hematoma. Disc levels:  Diffuse degenerative disc and facet disease. Upper chest: No acute findings Other: None IMPRESSION: Atrophy, chronic microvascular disease. No acute intracranial abnormality. No acute bony abnormality in the cervical spine.  Spondylosis. Electronically Signed   By: Rolm Baptise M.D.   On: 07/12/2019 21:02   Dg Chest Port 1 View  Result Date: 07/14/2019 CLINICAL DATA:  Respiratory failure. EXAM: PORTABLE CHEST 1 VIEW COMPARISON:  07/13/2019 FINDINGS: Endotracheal tube and right IJ central venous catheter unchanged. Enteric tube has been advanced and courses into the region of the stomach and off the film as tip is not visualized. Lungs are adequately inflated and otherwise clear. Cardiomediastinal silhouette and  remainder  of the exam is unchanged. IMPRESSION: No acute cardiopulmonary disease. Tubes and lines as described. Electronically Signed   By: Elberta Fortis M.D.   On: 07/14/2019 05:48   Dg Chest Portable 1 View  Result Date: 07/13/2019 CLINICAL DATA:  Intubation EXAM: PORTABLE CHEST 1 VIEW COMPARISON:  July 12, 2019 FINDINGS: New endotracheal tube is approximately 4 cm above the carina. New right IJ central line tip overlies the cavoatrial junction. Enteric tube passes just below the diaphragm with side port remaining within the distal esophagus. No pneumothorax. Lungs remain clear.  No pleural effusion.  Heart size is stable. IMPRESSION: Lines and tubes as above.  No pneumothorax. Electronically Signed   By: Guadlupe Spanish M.D.   On: 07/13/2019 14:19   Dg Chest Portable 1 View  Result Date: 07/12/2019 CLINICAL DATA:  Evaluate for aspiration pneumonia EXAM: PORTABLE CHEST 1 VIEW COMPARISON:  10/09/2017 FINDINGS: Heart is upper limits normal in size. Lungs clear. No effusions. No acute bony abnormality. IMPRESSION: No active disease. Electronically Signed   By: Charlett Nose M.D.   On: 07/12/2019 20:25   Dg Abd Portable 1 View  Result Date: 07/13/2019 CLINICAL DATA:  OG placement EXAM: PORTABLE ABDOMEN - 1 VIEW COMPARISON:  Chest radiograph 07/13/2019 FINDINGS: Distal tip of a central venous catheter terminates at the level of the right atrium. A transesophageal tube tip and side port are beyond the GE junction terminating near the gastric antrum/duodenal bulb. Additional monitoring leads overlie the chest. Lung bases are clear. Included cardiomediastinal contours are unremarkable. Bowel gas pattern is nonobstructive. Prior lumbar spinal fusion hardware is noted. No suspicious calcifications. IMPRESSION: 1. Distal tip of a central venous catheter terminates at the level of the right atrium. 2. Transesophageal tube tip and side port are beyond the GE, terminating near the gastric antrum/duodenal bulb.  Electronically Signed   By: Kreg Shropshire M.D.   On: 07/13/2019 16:19     Assessment & Plan:   Acute encephalopathy - unclear etiology at this point.  CT head, UDS, APAP, ASA, HIV all negative on admit.  TSH, CBG normal.  MRI brain pending.  07/14/2019  - improving with ICU care  plan - F/u MRI brain. - Avoid sedating meds.  Respiratory insufficiency - due to inability to protect the airway in the setting of above.   07/14/2019 - > does not meet criteria for SBT/Extubation in setting of Acute Respiratory Failure due to acute encephaloipathy, A Fib RVR and ciruclatory shock  PLAN - PRVC - VAP bundle  - CXR monitoring    Circulatory shock - presumed primarily to hypovolemia / dehydration.  Possible component sepsis (? UTI, no other obvious source at this point).   07/14/2019 - improved . Levophed down to . A Fib RVR compounding recovery  plan - 2L LR bolus and then d5LR maintenance  - levophed for MAP > 65 - add vasopressin (help with A Fib RVR by reducing levophed dose)     A.fib RVR - no known hx. ECHo ef 55%  plna - Empiric heparin for now. - check trop - hydrate and see if it improves - add vasopressin and see if it improved  - if not consider dig/amio a snex tstep  AKI.  - 07/14/2019 - improved buy still low Ur OP and urine concerntrated  Plan  - hydrate  Electrolyute imbalance  Plan  - replete Na Phos  - monitor high Na   Hx renal transplant  plan - Continue preadmission cellcept and prograf. -  Check levels.   immune suppressed  07/14/2019 - no obvious evidence of sepsis  Plan  - check PCT  Hx EtOH abuse. - Thiamine / folate.     Best Practice:  Diet: NPO. Start TF Pain/Anxiety/Delirium protocol (if indicated): fentanyl PRN / Midazolam PRN.  RASS goal 0. VAP protocol (if indicated): In place. DVT prophylaxis: SCD's / Heparin. GI prophylaxis: PPI. Glucose control: SSI if glucose consistently > 180. Mobility: Bedrest.  Code  Status:  Partial - from 07/14/2019   Family Communication:  Daughter Darl Pikes updated. Per Daughter -  Daughter has living will for pVS and irreversible willness and patient wishes for natural death. -> in current scenario what would be c/w No CPR if heart stops but otherwise full medical care (also would be c/w family conversations)  Daiughter wants patient to know that Jackquline Bosch is next president elect     Disposition: ICU.   ATTESTATION & SIGNATURE   The patient Kimila Papaleo is critically ill with multiple organ systems failure and requires high complexity decision making for assessment and support, frequent evaluation and titration of therapies, application of advanced monitoring technologies and extensive interpretation of multiple databases.   Critical Care Time devoted to patient care services described in this note is  45  Minutes. This time reflects time of care of this signee Dr Kalman Shan. This critical care time does not reflect procedure time, or teaching time or supervisory time of PA/NP/Med student/Med Resident etc but could involve care discussion time     Dr. Kalman Shan, M.D., [facility].C.P Pulmonary and Critical Care Medicine Staff Physician [facility] Pager: [phone], If no answer or between  15:00h - 7:00h: call [phone]  07/14/2019 10:52 AM    LABS    PULMONARY Recent Labs  Lab 07/12/19 2249 07/13/19 1608 07/14/19 0444  PHART  --  7.310* 7.379  PCO2ART  --  29.2* 29.2*  PO2ART  --  102.0 155*  HCO3 17.8* 14.8* 16.7*  TCO2 19* 16*  --   O2SAT 94.0 98.0 98.8    CBC Recent Labs  Lab 07/12/19 2013  07/13/19 0438 07/13/19 1608 07/14/19 0200  HGB 18.1*   < > 14.8 11.2* 13.1  HCT 52.9*   < > 43.6 33.0* 38.8  WBC 13.4*  --  11.1*  --  14.4*  PLT 185  --  127*  --  157   < > = values in this interval not displayed.    COAGULATION Recent Labs  Lab 07/12/19 2242  INR 1.3*    CARDIAC   No results for input(s): TROPONINI in the last 168 hours. No results for input(s): PROBNP in the last 168 hours.   CHEMISTRY Recent Labs  Lab 07/12/19 2013 07/12/19 2249 07/13/19 0438 07/13/19 1608 07/14/19 0200  NA 147* 148* 148* 147* 148*  K 3.3* 2.6* 2.9* 2.8* 4.6  CL 107  --  119*  --  122*  CO2 15*  --  17*  --  18*  GLUCOSE 128*  --  81  --  120*  BUN 43*  --  39*  --  31*  CREATININE 1.19*  --  1.16*  --  0.92  CALCIUM 10.5*  --  7.8*  --  8.6*  MG  --   --  1.6*  --  2.1  PHOS  --   --  2.8  --  1.8*   Estimated Creatinine Clearance: 56.9 mL/min (by  C-G formula based on SCr of 0.92 mg/dL).   LIVER Recent Labs  Lab 07/12/19 2013 07/12/19 2242 07/13/19 0438 07/14/19 0200  AST 74*  --  45* 42*  ALT 56*  --  33 31  ALKPHOS 65  --  46 53  BILITOT 1.8*  --  1.7* 1.4*  PROT 6.3*  --  3.8* 4.5*  ALBUMIN 3.4*  --  2.1* 2.3*  INR  --  1.3*  --   --      INFECTIOUS Recent Labs  Lab 07/12/19 2141 07/13/19 0610 07/13/19 1406  LATICACIDVEN 3.3* 2.3* 1.6     ENDOCRINE CBG (last 3)  Recent Labs    07/12/19 2026 07/13/19 1250 07/14/19 0044  GLUCAP 144* 110* 113*         IMAGING x48h  - image(s) personally visualized  -   highlighted in bold Ct Head Wo Contrast  Result Date: 07/12/2019 CLINICAL DATA:  Found on floor.  Altered level of consciousness. EXAM: CT HEAD WITHOUT CONTRAST CT CERVICAL SPINE WITHOUT CONTRAST TECHNIQUE: Multidetector CT imaging of the head and cervical spine was performed following the standard protocol without intravenous contrast. Multiplanar CT image reconstructions of the cervical spine were also generated. COMPARISON:  10/09/2017 FINDINGS: CT HEAD FINDINGS Brain: There is atrophy and chronic small vessel disease changes. No acute intracranial abnormality. Specifically, no hemorrhage, hydrocephalus, mass lesion, acute infarction, or significant intracranial injury. Vascular: No hyperdense vessel or unexpected calcification. Skull:  No acute calvarial abnormality. Sinuses/Orbits: Visualized paranasal sinuses and mastoids clear. Orbital soft tissues unremarkable. Other: None CT CERVICAL SPINE FINDINGS Alignment: Normal Skull base and vertebrae: No acute fracture. No primary bone lesion or focal pathologic process. Soft tissues and spinal canal: No prevertebral fluid or swelling. No visible canal hematoma. Disc levels:  Diffuse degenerative disc and facet disease. Upper chest: No acute findings Other: None IMPRESSION: Atrophy, chronic microvascular disease. No acute intracranial abnormality. No acute bony abnormality in the cervical spine.  Spondylosis. Electronically Signed   By: Charlett Nose M.D.   On: 07/12/2019 21:02   Ct Cervical Spine Wo Contrast  Result Date: 07/12/2019 CLINICAL DATA:  Found on floor.  Altered level of consciousness. EXAM: CT HEAD WITHOUT CONTRAST CT CERVICAL SPINE WITHOUT CONTRAST TECHNIQUE: Multidetector CT imaging of the head and cervical spine was performed following the standard protocol without intravenous contrast. Multiplanar CT image reconstructions of the cervical spine were also generated. COMPARISON:  10/09/2017 FINDINGS: CT HEAD FINDINGS Brain: There is atrophy and chronic small vessel disease changes. No acute intracranial abnormality. Specifically, no hemorrhage, hydrocephalus, mass lesion, acute infarction, or significant intracranial injury. Vascular: No hyperdense vessel or unexpected calcification. Skull: No acute calvarial abnormality. Sinuses/Orbits: Visualized paranasal sinuses and mastoids clear. Orbital soft tissues unremarkable. Other: None CT CERVICAL SPINE FINDINGS Alignment: Normal Skull base and vertebrae: No acute fracture. No primary bone lesion or focal pathologic process. Soft tissues and spinal canal: No prevertebral fluid or swelling. No visible canal hematoma. Disc levels:  Diffuse degenerative disc and facet disease. Upper chest: No acute findings Other: None IMPRESSION: Atrophy,  chronic microvascular disease. No acute intracranial abnormality. No acute bony abnormality in the cervical spine.  Spondylosis. Electronically Signed   By: Charlett Nose M.D.   On: 07/12/2019 21:02   Dg Chest Port 1 View  Result Date: 07/14/2019 CLINICAL DATA:  Respiratory failure. EXAM: PORTABLE CHEST 1 VIEW COMPARISON:  07/13/2019 FINDINGS: Endotracheal tube and right IJ central venous catheter unchanged. Enteric tube has been advanced and courses into the  region of the stomach and off the film as tip is not visualized. Lungs are adequately inflated and otherwise clear. Cardiomediastinal silhouette and remainder of the exam is unchanged. IMPRESSION: No acute cardiopulmonary disease. Tubes and lines as described. Electronically Signed   By: Elberta Fortisaniel  Boyle M.D.   On: 07/14/2019 05:48   Dg Chest Portable 1 View  Result Date: 07/13/2019 CLINICAL DATA:  Intubation EXAM: PORTABLE CHEST 1 VIEW COMPARISON:  July 12, 2019 FINDINGS: New endotracheal tube is approximately 4 cm above the carina. New right IJ central line tip overlies the cavoatrial junction. Enteric tube passes just below the diaphragm with side port remaining within the distal esophagus. No pneumothorax. Lungs remain clear.  No pleural effusion.  Heart size is stable. IMPRESSION: Lines and tubes as above.  No pneumothorax. Electronically Signed   By: Guadlupe SpanishPraneil  Patel M.D.   On: 07/13/2019 14:19   Dg Chest Portable 1 View  Result Date: 07/12/2019 CLINICAL DATA:  Evaluate for aspiration pneumonia EXAM: PORTABLE CHEST 1 VIEW COMPARISON:  10/09/2017 FINDINGS: Heart is upper limits normal in size. Lungs clear. No effusions. No acute bony abnormality. IMPRESSION: No active disease. Electronically Signed   By: Charlett NoseKevin  Dover M.D.   On: 07/12/2019 20:25   Dg Abd Portable 1 View  Result Date: 07/13/2019 CLINICAL DATA:  OG placement EXAM: PORTABLE ABDOMEN - 1 VIEW COMPARISON:  Chest radiograph 07/13/2019 FINDINGS: Distal tip of a central venous catheter  terminates at the level of the right atrium. A transesophageal tube tip and side port are beyond the GE junction terminating near the gastric antrum/duodenal bulb. Additional monitoring leads overlie the chest. Lung bases are clear. Included cardiomediastinal contours are unremarkable. Bowel gas pattern is nonobstructive. Prior lumbar spinal fusion hardware is noted. No suspicious calcifications. IMPRESSION: 1. Distal tip of a central venous catheter terminates at the level of the right atrium. 2. Transesophageal tube tip and side port are beyond the GE, terminating near the gastric antrum/duodenal bulb. Electronically Signed   By: Kreg ShropshirePrice  DeHay M.D.   On: 07/13/2019 16:19

## 2019-07-14 NOTE — Progress Notes (Signed)
[facility] Progress Note Patient Name: Anna [name] DOB: 1950-09-29 MRN: [id number]   Date of Service  07/14/2019  HPI/Events of Note  Agitation - Request for bilateral soft wrist restraints.  eICU Interventions  Will order bilateral soft wrist restraints. X 12 hours.      Intervention Category Major Interventions: Delirium, psychosis, severe agitation - evaluation and management  Sommer,Steven Eugene 07/14/2019, 8:30 PM

## 2019-07-14 NOTE — Progress Notes (Signed)
ANTICOAGULATION CONSULT NOTE - Follow Up Consult  Pharmacy Consult for Heparin Indication: atrial fibrillation  Allergies  Allergen Reactions  . Iodine-131 Hives and Shortness Of Breath  . 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 Penicillin G Rash    Has patient had a PCN reaction causing immediate rash, facial/tongue/throat swelling, SOB or lightheadedness with hypotension: Yes Has patient had a PCN reaction causing severe rash involving mucus membranes or skin necrosis: Unk Has patient had a PCN reaction that required hospitalization: Unk Has patient had a PCN reaction occurring within the last 10 years: No If all of the above answers are "NO", then may proceed with Cephalosporin use.     Patient Measurements: Height: '5\' 4"'$  (162.6 cm) Weight: 139 lb 1.8 oz (63.1 kg) IBW/kg (Calculated) : 54.7 Heparin Dosing Weight:   Vital Signs: Temp: 97.9 F (36.6 C) (11/06 2334) Temp Source: Oral (11/06 2334) BP: 93/75 (11/07 0245) Pulse Rate: 72 (11/07 0245)  Labs: Recent Labs    07/12/19 2013 07/12/19 2242 07/12/19 2249 07/13/19 0438 07/13/19 1608 07/13/19 2146  HGB 18.1*  --  14.3 14.8 11.2*  --   HCT 52.9*  --  42.0 43.6 33.0*  --   PLT 185  --   --  127*  --   --   APTT  --  28  --   --   --   --   LABPROT  --  15.8*  --   --   --   --   INR  --  1.3*  --   --   --   --   HEPARINUNFRC  --   --   --   --   --  0.31  CREATININE 1.19*  --   --  1.16*  --   --   CKTOTAL  --  555*  --   --   --   --     Estimated Creatinine Clearance: 40.1 mL/min (A) (by C-G formula based on SCr of 1.16 mg/dL (H)).   Medications:  Scheduled:  . chlorhexidine gluconate (MEDLINE KIT)  15 mL Mouth Rinse BID  . Chlorhexidine Gluconate Cloth  6 each Topical Daily  . fentaNYL (SUBLIMAZE) injection  50 mcg Intravenous Once  . folic acid  1 mg Intravenous Daily  . Gerhardt's butt cream   Topical TID  . mouth rinse  15 mL Mouth Rinse 10 times per day  .  midazolam  2 mg Intravenous Once  . mycophenolate  500 mg Per Tube BID  . nystatin cream   Topical BID  . pantoprazole (PROTONIX) IV  40 mg Intravenous QHS  . potassium chloride  40 mEq Per Tube Once  . tacrolimus  1 mg Oral QPM  . tacrolimus  2 mg Oral q morning - 10a  . thiamine  100 mg Intravenous Daily   Infusions:  . aztreonam    . heparin 950 Units/hr (07/14/19 0200)  . lactated ringers 125 mL/hr at 07/14/19 0200  . norepinephrine (LEVOPHED) Adult infusion 10 mcg/min (07/14/19 0200)    Assessment: Patient with heparin level at lower end of goal.  No heparin issues noted.    Goal of Therapy:  Heparin level 0.3-0.7 units/ml Monitor platelets by anticoagulation protocol: Yes   Plan:  Continue heparin drip at current rate Recheck level at 0800  Tyler Deis, [place] 07/14/2019,3:07 AM

## 2019-07-14 NOTE — Progress Notes (Signed)
Brief Nutrition Note  Consult received for enteral/tube feeding initiation and management.  Adult Enteral Nutrition Protocol initiated. Full assessment to follow.  Admitting Dx: Hypernatremia [E87.0] Renal insufficiency [N28.9] SIRS (systemic inflammatory response syndrome) (HCC) [R65.10] Hypothermia, initial encounter [T68.XXXA] Altered mental status, unspecified altered mental status type [R41.82] Acute encephalopathy [G93.40]  Body mass index is 27.25 kg/m.  Labs:  Recent Labs  Lab 07/12/19 2013  07/13/19 0438 07/13/19 1608 07/14/19 0200  NA 147*   < > 148* 147* 148*  K 3.3*   < > 2.9* 2.8* 4.6  CL 107  --  119*  --  122*  CO2 15*  --  17*  --  18*  BUN 43*  --  39*  --  31*  CREATININE 1.19*  --  1.16*  --  0.92  CALCIUM 10.5*  --  7.8*  --  8.6*  MG  --   --  1.6*  --  2.1  PHOS  --   --  2.8  --  1.8*  GLUCOSE 128*  --  81  --  120*   < > = values in this interval not displayed.    [employer] MS, RDN, LDN, CNSC [phone] Pager  [phone] Weekend/On-Call Pager

## 2019-07-14 NOTE — Progress Notes (Addendum)
Patient received from [facility] in uncontrolled A-fib with RVR. MD notified and IV Metoprolol. Patient is sustaining Heart Rates in 130-160s. MD stated she wants to wait for morning lab results to treat patient. Will continue to monitor.

## 2019-07-14 NOTE — Progress Notes (Signed)
ANTICOAGULATION CONSULT NOTE - Follow Up Consult  Pharmacy Consult for Heparin Indication: atrial fibrillation  Allergies  Allergen Reactions  . Iodine-131 Hives and Shortness Of Breath  . 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 Penicillin G Rash    Has patient had a PCN reaction causing immediate rash, facial/tongue/throat swelling, SOB or lightheadedness with hypotension: Yes Has patient had a PCN reaction causing severe rash involving mucus membranes or skin necrosis: Unk Has patient had a PCN reaction that required hospitalization: Unk Has patient had a PCN reaction occurring within the last 10 years: No If all of the above answers are "NO", then may proceed with Cephalosporin use.     Patient Measurements: Height: '5\' 4"'$  (162.6 cm) Weight: 158 lb 11.7 oz (72 kg) IBW/kg (Calculated) : 54.7  Vital Signs: Temp: 99.4 F (37.4 C) (11/07 0417) Temp Source: Oral (11/07 0417) BP: 107/65 (11/07 0800) Pulse Rate: 54 (11/07 0800)  Labs: Recent Labs    07/12/19 2013 07/12/19 2242  07/13/19 0438 07/13/19 1608 07/13/19 2146 07/14/19 0200 07/14/19 0755  HGB 18.1*  --    < > 14.8 11.2*  --  13.1  --   HCT 52.9*  --    < > 43.6 33.0*  --  38.8  --   PLT 185  --   --  127*  --   --  157  --   APTT  --  28  --   --   --   --   --   --   LABPROT  --  15.8*  --   --   --   --   --   --   INR  --  1.3*  --   --   --   --   --   --   HEPARINUNFRC  --   --   --   --   --  0.31  --  0.62  CREATININE 1.19*  --   --  1.16*  --   --  0.92  --   CKTOTAL  --  555*  --   --   --   --   --   --    < > = values in this interval not displayed.    Estimated Creatinine Clearance: 56.9 mL/min (by C-G formula based on SCr of 0.92 mg/dL).   Medications:  Scheduled:  . chlorhexidine gluconate (MEDLINE KIT)  15 mL Mouth Rinse BID  . Chlorhexidine Gluconate Cloth  6 each Topical Daily  . fentaNYL (SUBLIMAZE) injection  50 mcg Intravenous Once  . folic acid  1 mg  Intravenous Daily  . Gerhardt's butt cream   Topical TID  . mouth rinse  15 mL Mouth Rinse 10 times per day  . midazolam  2 mg Intravenous Once  . mycophenolate  500 mg Per Tube BID  . nystatin cream   Topical BID  . pantoprazole (PROTONIX) IV  40 mg Intravenous QHS  . potassium chloride  40 mEq Per Tube Once  . tacrolimus  1 mg Oral QPM  . tacrolimus  2 mg Oral q morning - 10a  . thiamine  100 mg Intravenous Daily   Infusions:  . aztreonam    . heparin 950 Units/hr (07/14/19 0600)  . lactated ringers 125 mL/hr at 07/14/19 0600  . norepinephrine (LEVOPHED) Adult infusion 10 mcg/min (07/14/19 0600)    Assessment: 68 yo female presented on 07/12/2019 when  patient was found from EMS on the bathroom floor.   Pharmacy consulted to dose heparin for Afib. Patient is not on anticoagulation prior to admission. Patient is currently in Afib. CHAD2-VASc score of 3. No reported bleeding. Baseline Hgb 11.6. Plt 127. Baseline aPTT WNL, INR slightly elevated at 1.3.  07/14/2019:  Heparin level therapeutic (0.62) on 950 units/hr  CBC- WNL  No bleeding or infusion related issues reported by RN   Goal of Therapy:  Heparin level 0.3-0.7 units/ml Monitor platelets by anticoagulation protocol: Yes   Plan:  Continue heparin drip at current rate Daily HL & CBC while on heparin  Netta Cedars, PharmD, BCPS 07/14/2019,8:50 AM

## 2019-07-14 NOTE — Progress Notes (Signed)
   Now more agitated Able to follow commands On levophed 7 and vaso HR 80-120 with A Fib RVR - improved bu still gets tachy when agitated  Plan Start precedex gtt but monitor pressor needs     SIGNATURE    Dr. Brand Males, M.D., F.C.C.P,  Pulmonary and Critical Care Medicine Staff Physician, [place] Director - Interstitial Lung Disease  Program  Pulmonary [place] [facility] [place], [place], [address]  Pager: [phone], If no answer or between  15:00h - 7:00h: call [phone] Telephone: [phone]  6:02 PM 07/14/2019

## 2019-07-15 ENCOUNTER — Inpatient Hospital Stay (HOSPITAL_COMMUNITY): Payer: Medicare Other

## 2019-07-15 DIAGNOSIS — R579 Shock, unspecified: Secondary | ICD-10-CM

## 2019-07-15 DIAGNOSIS — I4891 Unspecified atrial fibrillation: Secondary | ICD-10-CM

## 2019-07-15 LAB — CBC
HCT: 33.1 % — ABNORMAL LOW (ref 36.0–46.0)
HCT: 33.4 % — ABNORMAL LOW (ref 36.0–46.0)
Hemoglobin: 10.8 g/dL — ABNORMAL LOW (ref 12.0–15.0)
Hemoglobin: 11.1 g/dL — ABNORMAL LOW (ref 12.0–15.0)
MCH: 34.7 pg — ABNORMAL HIGH (ref 26.0–34.0)
MCH: 35.7 pg — ABNORMAL HIGH (ref 26.0–34.0)
MCHC: 32.6 g/dL (ref 30.0–36.0)
MCHC: 33.2 g/dL (ref 30.0–36.0)
MCV: 106.4 fL — ABNORMAL HIGH (ref 80.0–100.0)
MCV: 107.4 fL — ABNORMAL HIGH (ref 80.0–100.0)
Platelets: 139 10*3/uL — ABNORMAL LOW (ref 150–400)
Platelets: 142 10*3/uL — ABNORMAL LOW (ref 150–400)
RBC: 3.11 MIL/uL — ABNORMAL LOW (ref 3.87–5.11)
RBC: 3.11 MIL/uL — ABNORMAL LOW (ref 3.87–5.11)
RDW: 13.7 % (ref 11.5–15.5)
RDW: 13.7 % (ref 11.5–15.5)
WBC: 10.9 10*3/uL — ABNORMAL HIGH (ref 4.0–10.5)
WBC: 9.8 10*3/uL (ref 4.0–10.5)
nRBC: 1.9 % — ABNORMAL HIGH (ref 0.0–0.2)
nRBC: 2.1 % — ABNORMAL HIGH (ref 0.0–0.2)

## 2019-07-15 LAB — MAGNESIUM
Magnesium: 1.8 mg/dL (ref 1.7–2.4)
Magnesium: 1.9 mg/dL (ref 1.7–2.4)

## 2019-07-15 LAB — GLUCOSE, CAPILLARY
Glucose-Capillary: 154 mg/dL — ABNORMAL HIGH (ref 70–99)
Glucose-Capillary: 162 mg/dL — ABNORMAL HIGH (ref 70–99)
Glucose-Capillary: 178 mg/dL — ABNORMAL HIGH (ref 70–99)
Glucose-Capillary: 178 mg/dL — ABNORMAL HIGH (ref 70–99)
Glucose-Capillary: 195 mg/dL — ABNORMAL HIGH (ref 70–99)
Glucose-Capillary: 215 mg/dL — ABNORMAL HIGH (ref 70–99)
Glucose-Capillary: 216 mg/dL — ABNORMAL HIGH (ref 70–99)

## 2019-07-15 LAB — BASIC METABOLIC PANEL
Anion gap: 4 — ABNORMAL LOW (ref 5–15)
BUN: 31 mg/dL — ABNORMAL HIGH (ref 8–23)
CO2: 18 mmol/L — ABNORMAL LOW (ref 22–32)
Calcium: 8.4 mg/dL — ABNORMAL LOW (ref 8.9–10.3)
Chloride: 123 mmol/L — ABNORMAL HIGH (ref 98–111)
Creatinine, Ser: 0.96 mg/dL (ref 0.44–1.00)
GFR calc Af Amer: >60 mL/min (ref >60)
GFR calc non Af Amer: >60 mL/min (ref >60)
Glucose, Bld: 234 mg/dL — ABNORMAL HIGH (ref 70–99)
Potassium: 4 mmol/L (ref 3.5–5.1)
Sodium: 145 mmol/L (ref 135–145)

## 2019-07-15 LAB — LACTIC ACID, PLASMA: Lactic Acid, Venous: 1.6 mmol/L (ref 0.5–1.9)

## 2019-07-15 LAB — TACROLIMUS LEVEL: Tacrolimus (FK506) - LabCorp: 2.3 ng/mL (ref 2.0–20.0)

## 2019-07-15 LAB — PROTIME-INR
INR: 1.6 — ABNORMAL HIGH (ref 0.8–1.2)
Prothrombin Time: 19 seconds — ABNORMAL HIGH (ref 11.4–15.2)

## 2019-07-15 LAB — PROCALCITONIN: Procalcitonin: 0.34 ng/mL

## 2019-07-15 LAB — HEPARIN LEVEL (UNFRACTIONATED): Heparin Unfractionated: 0.6 IU/mL (ref 0.30–0.70)

## 2019-07-15 LAB — TROPONIN I (HIGH SENSITIVITY): Troponin I (High Sensitivity): 26 ng/L — ABNORMAL HIGH (ref <18)

## 2019-07-15 LAB — PHOSPHORUS: Phosphorus: 1.9 mg/dL — ABNORMAL LOW (ref 2.5–4.6)

## 2019-07-15 MED ORDER — MAGNESIUM SULFATE 2 GM/50ML IV SOLN
2.0000 g | Freq: Once | INTRAVENOUS | Status: AC
  Administered 2019-07-15: 2 g via INTRAVENOUS
  Filled 2019-07-15: qty 50

## 2019-07-15 MED ORDER — SODIUM CHLORIDE 0.9% FLUSH: 10.0000 mL | INTRAVENOUS | Status: DC | PRN

## 2019-07-15 MED ORDER — PRO-STAT SUGAR FREE PO LIQD
30.0000 mL | Freq: Every day | ORAL | Status: DC
  Administered 2019-07-16 – 2019-07-17 (×2): 30 mL
  Filled 2019-07-15 (×2): qty 30

## 2019-07-15 MED ORDER — SODIUM CHLORIDE 0.9% FLUSH
10.0000 mL | Freq: Two times a day (BID) | INTRAVENOUS | Status: DC
  Administered 2019-07-15 – 2019-07-25 (×17): 10 mL

## 2019-07-15 MED ORDER — SODIUM PHOSPHATES 45 MMOLE/15ML IV SOLN
10.0000 mmol | Freq: Once | INTRAVENOUS | Status: AC
  Administered 2019-07-15: 10 mmol via INTRAVENOUS
  Filled 2019-07-15: qty 3.33

## 2019-07-15 MED ORDER — VITAL AF 1.2 CAL PO LIQD
1000.0000 mL | ORAL | Status: DC
  Administered 2019-07-15 – 2019-07-17 (×2): 1000 mL

## 2019-07-15 MED ORDER — INSULIN ASPART 100 UNIT/ML ~~LOC~~ SOLN
2.0000 [IU] | SUBCUTANEOUS | Status: DC
  Administered 2019-07-15 (×4): 4 [IU] via SUBCUTANEOUS
  Administered 2019-07-16 (×2): 2 [IU] via SUBCUTANEOUS
  Administered 2019-07-16 (×2): 4 [IU] via SUBCUTANEOUS
  Administered 2019-07-16 – 2019-07-22 (×14): 2 [IU] via SUBCUTANEOUS

## 2019-07-15 NOTE — Progress Notes (Signed)
NAME:  Anna [name], MRN:  [id number], DOB:  05-18-1951, LOS: 3 ADMISSION DATE:  07/12/2019, CONSULTATION DATE:  07/13/19  REFERRING MD:  Alvino Chapel  CHIEF COMPLAINT:  AMS   Brief History   Anna [name] is a 68 y.o. female who has a PMH including but not limited to asthma, HTN, renal transplant, EtOH abuse.  She presented to [facility] ED 11/5 after she was found in her home down and minimally responsive.  She had apparently not been in contact with either daughter for over a week and she then missed her rent payment.  Landlord went to her apartment but there was no answer at the door; therefore, police were called for a welfare check.  This was when she was found and it was noted that she had tylenol pills and empty alcohol bottles around her.  SpO2 was in 60's and SBP in 80's with EMS. She was brought to ED for further workup.  In ED, head CT was neg, EtOH, tylenol, ASA levels all normal.  She was noted to be in A.fib with RVR with HR in low 100's.  She was initially admitted to SDU under [facility] service; however, around lunch time on 11/6, she had worsening mental status with questionable airway protection.  PCCM was subsequently called in consultation.  Upon our arrival, it was felt that pt had poor airway protection; therefore, she was intubated.  She was also hypotensive felt to be primarily due to hypovolemia.  She was started on fluids as well as levophed.  MRI brain is pending.  Of note, daughter reports after she searched pt's apartment, she found hundreds of empty 1.5L bottles of wine.  On review of her grocery receipts, she also noted that pt had been buying 6 bottles of 1.5L wine per week.  In addition, she suspects that pt had been buying more wine in person since she has a hx of abusing Zquil and childrens Dimetapp in the past.  Past Medical History  HTN, asthma, renal transplant (on cellcept and prograf).   has a past medical history of Asthma, Hypertension, and Renal disorder.   has a  past surgical history that includes Nephrectomy transplanted organ and Cesarean section.   Significant Hospital Events   11/5 > admit. 11/6 > intubated. 11/7 - Per RN who d/with Daughter Darl Pikes - poatient drinking 6 x 1.5L of wine per week., Last seen/heard 1 week perior to admission. On day of admit - wellness check revelaed unconscious patients surrounded by lot of bottles of empty wine, hypotensive and hypoxemic. Currently - fio2 40% on vent, on levophed -> , in A Fib RVR with IV heparin but no amio/dig only lopressor prn.  Low Ur Op but creat better. Improving responsiveness - not on sedation. Echo with ef 55%  Consults:  PCCM.  Procedures:  ETT 11/6 >  R IJ CVL 11/6 >   Significant Diagnostic Tests:  CT head / C-Spine 11/6 > atrophy with chronic microvascular disease.  No acute process. MRI brain 11/6 >  Echo 11/6 >  Ef 55% U tox and blood tox 11/5 2020 - neg  Micro Data:  Blood 11/6 >  Sputum 11/6 >  Urine 11/6 >  SARS CoV2 11/6 >   Antimicrobials:   Vanc 11/5 - 11/7 Levoflox 11/6 - 11/6 Aztreonam 11/6 -      Interim history/subjective:    07/15/2019 -  Doing SBT . On low dose leveophd. Off vasopressin. RVR resolved but still in A Fib. On  Hepain Gttt. On vent . Doin SBT . Able to nod and try to track to shout but not opening eyes. Has needed precedex intermittently. On TF. [place] with fatty liver +  Objective:  Blood pressure (!) 85/58, pulse (!) 44, temperature 98.3 F (36.8 C), temperature source Oral, resp. rate 20, height 5\' 4"  (1.626 m), weight 79 kg, SpO2 100 %. CVP:  [7 mmHg-18 mmHg] 11 mmHg  Vent Mode: PRVC FiO2 (%):  [30 %-40 %] 30 % Set Rate:  [16 bmp] 16 bmp Vt Set:  [430 mL] 430 mL PEEP:  [5 cmH20] 5 cmH20 Plateau Pressure:  [14 cmH20-16 cmH20] 15 cmH20   Intake/Output Summary (Last 24 hours) at 07/15/2019 0944 Last data filed at 07/15/2019 0800 Gross per 24 hour  Intake 4254.36 ml  Output 650 ml  Net 3604.36 ml   Filed Weights    07/14/19 0407 07/14/19 2000 07/15/19 0434  Weight: 72 kg 80 kg 79 kg   General Appearance:  Looks criticall il Head:  Normocephalic, without obvious abnormality, atraumatic Eyes:  PERRL - yes conjunctiva/corneas - muddy     Ears:  Normal external ear canals, both ears Nose:  G tube - no Throat:  ETT TUBE - yes , OG tube - yes Neck:  Supple,  No enlargement/tenderness/nodules Lungs: Clear to auscultation bilaterally, Ventilator   Synchrony - yes Heart:  S1 and S2 normal, no murmur, CVP - no.  Pressors - yes Abdomen:  Soft, no masses, no organomegaly Genitalia / Rectal:  Not done Extremities:  Extremities- intact Skin:  ntact in exposed areas . Sacral area - not examined Neurologic:  Sedation - none -> RASS - -3 to-2  . Moves all 4s - yes. CAM-ICU - unable to test . Orientation - unable to test     .  Assessment & Plan:   Acute encephalopathy - unclear etiology at this point but etoh intoxication and wernicke are possibilities.  CT head, UDS, APAP, ASA, HIV all negative on admit.  TSH, CBG normal.  MRI brain pending.  07/15/2019  - steadiluy improving with ICU care. MRI brain not done yet - unclear why  plan - F/u MRI brain. - might not be needed - Avoid sedating meds. - Use precedex and restratints as needed (high risk fo withdrawal  Acute Resp Failure   07/15/2019 - >  Doing SBT but does not meet extubation criteria due to RASS -2/-3   PLAN - PRVC - VAP bundled   Circulatory shock - presumed primarily to hypovolemia / dehydration.  Possible component sepsis (? UTI, no other obvious source at this point).   07/15/2019 -  Improving. Down to low dose levophed   plan - d5LR maintenance reduce to 50cc/h  - levophed for MAP > 65  - a- can hold vasopressin but restart if needed   A.fib RVR - no known hx. ECHo ef 55%. hS trop 54-> 26  07/15/2019 - HR 80s but in A Fib  plna -Continue  Empiric heparin for now.  - monitor, if worse - consider dig/amio   AKI.  -  07/15/2019 - improved   Plan  - monitor with hydration  Electrolyute imbalance  Plan Replete mag and phos   Hx renal transplant  plan - Continue preadmission cellcept and prograf. - Check levels.   immune suppressed  07/15/2019 - possible sepsis - unclear. Cultures pending  Plan  -empiric aztreonam for now - dc if cultures negative   Hx EtOH abuse. - Thiamine / folate.  Transaminitis and Jaundice - US RUQ - fatty liver +   - 07/15/2019 - improving.   Plan  - monitor  Best Practice:  Diet: NPO. Start TF Pain/Anxiety/Delirium protocol (if indicated): fentanyl PRN / Midazolam PRN.  RASS goal 0. VAP protocol (if indicated): In place. DVT prophylaxis: SCD's / Heparin. GI prophylaxis: PPI. Glucose control: phase 1 hyperglycemia  Mobility: Bedrest.  Code Status:  Partial   Family Communication:    -  07/14/2019 Daughter [name] updated. Per Daughter -  Daughter has living will for pVS and irreversible willness and patient wishes for natural death. -> in current scenario what would be c/w No CPR if heart stops but otherwise full medical care (also would be c/w family conversations)  Daiughter wants patient to know that Jackquline BoschBiden is next president elect   - 07/15/2019 - Daughter updated   Disposition: ICU.   ATTESTATION & SIGNATURE    ATTESTATION & SIGNATURE   The patient Anna LobeKathleen Ayo is critically ill with multiple organ systems failure and requires high complexity decision making for assessment and support, frequent evaluation and titration of therapies, application of advanced monitoring technologies and extensive interpretation of multiple databases.   Critical Care Time devoted to patient care services described in this note is  30  Minutes. This time reflects time of care of this signee Dr Kalman ShanMurali Lukis Bunt. This critical care time does not reflect procedure time, or teaching time or supervisory time of PA/NP/Med student/Med Resident etc but could involve care  discussion time     Dr. Kalman ShanMurali Sheina Mcleish, M.D., [facility] Surgery CenterF.C.C.P Pulmonary and Critical Care Medicine Staff Physician [facility] Pager: [phone], If no answer or between  15:00h - 7:00h: call [phone]  07/15/2019 10:03 AM    LABS    PULMONARY Recent Labs  Lab 07/12/19 2249 07/13/19 1608 07/14/19 0444  PHART  --  7.310* 7.379  PCO2ART  --  29.2* 29.2*  PO2ART  --  102.0 155*  HCO3 17.8* 14.8* 16.7*  TCO2 19* 16*  --   O2SAT 94.0 98.0 98.8    CBC Recent Labs  Lab 07/14/19 0200 07/14/19 2330 07/15/19 0400  HGB 13.1 11.1* 10.8*  HCT 38.8 33.4* 33.1*  WBC 14.4* 10.9* 9.8  PLT 157 142* 139*    COAGULATION Recent Labs  Lab 07/12/19 2242 07/15/19 0400  INR 1.3* 1.6*    CARDIAC  No results for input(s): TROPONINI in the last 168 hours. No results for input(s): PROBNP in the last 168 hours.   CHEMISTRY Recent Labs  Lab 07/12/19 2013  07/13/19 0438 07/13/19 1608 07/14/19 0200 07/14/19 1252 07/14/19 1700 07/14/19 1800 07/14/19 2330 07/15/19 0400  NA 147*   < > 148* 147* 148*  --   --  144 145  --   K 3.3*   < > 2.9* 2.8* 4.6  --   --  4.5 4.0  --   CL 107  --  119*  --  122*  --   --  123* 123*  --   CO2 15*  --  17*  --  18*  --   --  17* 18*  --   GLUCOSE 128*  --  81  --  120*  --   --  152* 234*  --   BUN 43*  --  39*  --  31*  --   --  26* 31*  --   CREATININE 1.19*  --  1.16*  --  0.92  --   --  0.97 0.96  --   CALCIUM 10.5*  --  7.8*  --  8.6*  --   --  8.3* 8.4*  --   MG  --   --  1.6*  --  2.1 1.9 1.7  --   --  1.8  PHOS  --   --  2.8  --  1.8* 1.6* 2.6  --   --  1.9*   < > = values in this interval not displayed.   Estimated Creatinine Clearance: 57 mL/min (by C-G formula based on SCr of 0.96 mg/dL).   LIVER Recent Labs  Lab 07/12/19 2013 07/12/19 2242 07/13/19 0438 07/14/19 0200 07/14/19 1130 07/15/19 0400  AST 74*  --  45* 42* 33  --   ALT 56*  --  33 31 25  --   ALKPHOS 65  --   46 53 46  --   BILITOT 1.8*  --  1.7* 1.4* 1.5*  --   PROT 6.3*  --  3.8* 4.5* 4.1*  --   ALBUMIN 3.4*  --  2.1* 2.3* 2.0*  --   INR  --  1.3*  --   --   --  1.6*     INFECTIOUS Recent Labs  Lab 07/13/19 0610 07/13/19 1406 07/14/19 1130 07/15/19 0400  LATICACIDVEN 2.3* 1.6  --  1.6  PROCALCITON  --   --  0.30 0.34     ENDOCRINE CBG (last 3)  Recent Labs    07/15/19 0008 07/15/19 0434 07/15/19 0743  GLUCAP 216* 195* 215*         IMAGING x48h  - image(s) personally visualized  -   highlighted in bold Dg Chest Port 1 View  Result Date: 07/15/2019 CLINICAL DATA:  ETOH abuse.  Hypertension. EXAM: PORTABLE CHEST 1 VIEW COMPARISON:  07/14/2019 FINDINGS: Patient is rotated to the right. Endotracheal tube has tip 3 cm above the carina. Right IJ central venous catheter with tip at the cavoatrial junction. Enteric tube courses into the region of the stomach and off the film as tip is not visualized. Lungs are adequately inflated and otherwise clear. Cardiomediastinal silhouette and remainder of the exam is unchanged. IMPRESSION: No acute cardiopulmonary disease. Tubes and lines as described. Electronically Signed   By: Marin Olp M.D.   On: 07/15/2019 07:17   Dg Chest Port 1 View  Result Date: 07/14/2019 CLINICAL DATA:  Respiratory failure. EXAM: PORTABLE CHEST 1 VIEW COMPARISON:  07/13/2019 FINDINGS: Endotracheal tube and right IJ central venous catheter unchanged. Enteric tube has been advanced and courses into the region of the stomach and off the film as tip is not visualized. Lungs are adequately inflated and otherwise clear. Cardiomediastinal silhouette and remainder of the exam is unchanged. IMPRESSION: No acute cardiopulmonary disease. Tubes and lines as described. Electronically Signed   By: Marin Olp M.D.   On: 07/14/2019 05:48   Dg Chest Portable 1 View  Result Date: 07/13/2019 CLINICAL DATA:  Intubation EXAM: PORTABLE CHEST 1 VIEW COMPARISON:  July 12, 2019  FINDINGS: New endotracheal tube is approximately 4 cm above the carina. New right IJ central line tip overlies the cavoatrial junction. Enteric tube passes just below the diaphragm with side port remaining within the distal esophagus. No pneumothorax. Lungs remain clear.  No pleural effusion.  Heart size is stable. IMPRESSION: Lines and tubes as above.  No pneumothorax. Electronically Signed   By: Macy Mis M.D.   On: 07/13/2019 14:19  Dg Abd Portable 1 View  Result Date: 07/13/2019 CLINICAL DATA:  OG placement EXAM: PORTABLE ABDOMEN - 1 VIEW COMPARISON:  Chest radiograph 07/13/2019 FINDINGS: Distal tip of a central venous catheter terminates at the level of the right atrium. A transesophageal tube tip and side port are beyond the GE junction terminating near the gastric antrum/duodenal bulb. Additional monitoring leads overlie the chest. Lung bases are clear. Included cardiomediastinal contours are unremarkable. Bowel gas pattern is nonobstructive. Prior lumbar spinal fusion hardware is noted. No suspicious calcifications. IMPRESSION: 1. Distal tip of a central venous catheter terminates at the level of the right atrium. 2. Transesophageal tube tip and side port are beyond the GE, terminating near the gastric antrum/duodenal bulb. Electronically Signed   By: Kreg Shropshire M.D.   On: 07/13/2019 16:19   US Abdomen Limited Ruq  Result Date: 07/14/2019 CLINICAL DATA:  Altered mental status, ETOH EXAM: ULTRASOUND ABDOMEN LIMITED RIGHT UPPER QUADRANT COMPARISON:  CT abdomen/pelvis dated 10/10/2017 FINDINGS: Gallbladder: Gallbladder sludge. No gallbladder wall thickening or pericholecystic fluid. Common bile duct: Diameter: 2 mm Liver: Hyperechoic hepatic parenchyma, suggesting hepatic steatosis. No focal hepatic lesion is visualized. Portal vein is patent on color Doppler imaging with normal direction of blood flow towards the liver. Other: None. IMPRESSION: Gallbladder sludge, without associated sonographic  findings to suggest acute cholecystitis. Hepatic steatosis. Electronically Signed   By: Charline Bills M.D.   On: 07/14/2019 14:51

## 2019-07-15 NOTE — Progress Notes (Signed)
ANTICOAGULATION CONSULT NOTE - Follow Up Consult  Pharmacy Consult for Heparin Indication: atrial fibrillation  Allergies  Allergen Reactions  . Iodine-131 Hives and Shortness Of Breath  . 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 Penicillin G Rash    Has patient had a PCN reaction causing immediate rash, facial/tongue/throat swelling, SOB or lightheadedness with hypotension: Yes Has patient had a PCN reaction causing severe rash involving mucus membranes or skin necrosis: Unk Has patient had a PCN reaction that required hospitalization: Unk Has patient had a PCN reaction occurring within the last 10 years: No If all of the above answers are "NO", then may proceed with Cephalosporin use.     Patient Measurements: Height: 5' 4" (162.6 cm) Weight: 174 lb 2.6 oz (79 kg) IBW/kg (Calculated) : 54.7  Vital Signs: Temp: 98.3 F (36.8 C) (11/08 0400) Temp Source: Axillary (11/08 0400) BP: 111/64 (11/08 0600) Pulse Rate: 70 (11/08 0600)  Labs: Recent Labs    07/12/19 2242  07/13/19 2146 07/14/19 0200 07/14/19 0755 07/14/19 1130 07/14/19 1800 07/14/19 2330 07/15/19 0400  HGB  --    < >  --  13.1  --   --   --  11.1* 10.8*  HCT  --    < >  --  38.8  --   --   --  33.4* 33.1*  PLT  --    < >  --  157  --   --   --  142* 139*  APTT 28  --   --   --   --   --   --   --   --   LABPROT 15.8*  --   --   --   --   --   --   --  19.0*  INR 1.3*  --   --   --   --   --   --   --  1.6*  HEPARINUNFRC  --   --  0.31  --  0.62  --   --   --  0.60  CREATININE  --    < >  --  0.92  --   --  0.97 0.96  --   CKTOTAL 555*  --   --   --   --   --   --   --   --   TROPONINIHS  --   --   --   --   --  54*  --   --  26*   < > = values in this interval not displayed.    Estimated Creatinine Clearance: 57 mL/min (by C-G formula based on SCr of 0.96 mg/dL).   Medications:  Scheduled:  . chlorhexidine gluconate (MEDLINE KIT)  15 mL Mouth Rinse BID  .  Chlorhexidine Gluconate Cloth  6 each Topical Daily  . feeding supplement (PRO-STAT SUGAR FREE 64)  30 mL Per Tube BID  . feeding supplement (VITAL HIGH PROTEIN)  1,000 mL Per Tube Q24H  . fentaNYL (SUBLIMAZE) injection  50 mcg Intravenous Once  . folic acid  1 mg Intravenous Daily  . Gerhardt's butt cream   Topical TID  . mouth rinse  15 mL Mouth Rinse 10 times per day  . midazolam  2 mg Intravenous Once  . mycophenolate  500 mg Per Tube BID  . nystatin cream   Topical BID  . pantoprazole (PROTONIX) IV  40 mg Intravenous QHS  .  tacrolimus  1 mg Oral QPM  . tacrolimus  2 mg Oral q morning - 10a  . thiamine  100 mg Intravenous Daily   Infusions:  . aztreonam Stopped (07/15/19 0323)  . dexmedetomidine (PRECEDEX) IV infusion 0.5 mcg/kg/hr (07/15/19 0600)  . dextrose 5% lactated ringers 100 mL/hr at 07/15/19 0600  . heparin 950 Units/hr (07/15/19 0600)  . norepinephrine (LEVOPHED) Adult infusion 2 mcg/min (07/15/19 0646)  . vasopressin (PITRESSIN) infusion - *FOR SHOCK* 0.03 Units/min (07/15/19 0600)    Assessment: 68 yo female presented on 07/12/2019 when patient was found from EMS on the bathroom floor.   Pharmacy consulted to dose heparin for Afib. Patient is not on anticoagulation prior to admission. Patient is currently in Afib. CHAD2-VASc score of 3. No reported bleeding. Baseline Hgb 11.6. Plt 127. Baseline aPTT WNL, INR slightly elevated at 1.3.  07/15/2019:  Heparin level therapeutic (0.60) on 950 units/hr  CBC- Hg low, trending down 10.8; pltc low 139, but relatively stable  No bleeding or infusion related issues reported by RN   Goal of Therapy:  Heparin level 0.3-0.7 units/ml Monitor platelets by anticoagulation protocol: Yes   Plan:  Continue heparin drip at current rate Daily HL & CBC while on heparin  Netta Cedars, PharmD, BCPS 07/15/2019,7:12 AM

## 2019-07-15 NOTE — Progress Notes (Signed)
Initial Nutrition Assessment  INTERVENTION:   Monitor magnesium, potassium, and phosphorus daily for at least 3 days, MD to replete as needed, as pt is at risk for refeeding syndrome.  -Switch to Vital AF 1.2 @ 50 ml/hr via OGT -30 ml Prostat daily -This provides 1540 kcal, 105g protein and 973 ml H2O.  NUTRITION DIAGNOSIS:   Inadequate oral intake related to inability to eat as evidenced by NPO status.  GOAL:   Patient will meet greater than or equal to 90% of their needs  MONITOR:   Vent status, Labs, Weight trends, TF tolerance, I & O's  REASON FOR ASSESSMENT:   Consult, Ventilator Enteral/tube feeding initiation and management  ASSESSMENT:   68 y.o. female who has a PMH including but not limited to asthma, HTN, renal transplant, EtOH abuse.  She presented to [facility] ED 11/5 after she was found in her home down and minimally responsive.  11/5: admitted 11/6: intubated w/ OGT  **RD working remotely**  Patient is currently intubated on ventilator support MV: 8.2 L/min Temp (24hrs), Avg:98.7 F (37.1 C), Min:97.9 F (36.6 C), Max:99.7 F (37.6 C)  Patient with history of ETOH abuse, per family pt consumes 6 bottles of wine weekly. Pt was found poorly responsive among empty bottles PTA. Suspect PO intake has been poor as well. Pt is at risk of refeeding syndrome.  Tube feeding protocol was initiated remotely on 11/7. Pt currently receiving Vital HP @ 40 ml/hr with 30 ml Prostat BID (providing 1160 kcal and 114g protein). Will adjust regimen to better meet estimated needs.  Per MD note, no plans for extubation today.  Per weight records, pt weighed 158 lbs at admission. Weights are trending up currently. Current weight is 174 lbs.  I/Os: +11.4L since admit UOP: 650 ml x 24 hrs  Medications: IV folic acid, IV Thiamine, Precedex infusion,  D5 in lactated ringers infusion, IV Mg sulfate, Levophed infusion,  sodium phos infusion, Pitressin infusion Labs reviewed: CBGs:  195-215 Low Phos Mg WNL  NUTRITION - FOCUSED PHYSICAL EXAM:  Working remotely.  Diet Order:   Diet Order            Diet NPO time specified  Diet effective now              EDUCATION NEEDS:   No education needs have been identified at this time  Skin:  Skin Assessment: Skin Integrity Issues: Skin Integrity Issues:: DTI DTI: Per WOC note: Right posterior thoracic chest, Right buttock, Left buttock near anus. Left posterior thigh  Last BM:  11/8- type 6  Height:   Ht Readings from Last 1 Encounters:  07/12/19 5\' 4"  (1.626 m)    Weight:   Wt Readings from Last 1 Encounters:  07/15/19 79 kg    Ideal Body Weight:  54.5 kg  BMI:  Body mass index is 29.9 kg/m.  Estimated Nutritional Needs:   Kcal:  3419  Protein:  100-110g  Fluid:  1.5L/day  Anna Bibles, MS, RD, LDN Inpatient Clinical Dietitian Pager: [phone] After Hours Pager: [phone]

## 2019-07-16 ENCOUNTER — Inpatient Hospital Stay (HOSPITAL_COMMUNITY): Payer: Medicare Other

## 2019-07-16 DIAGNOSIS — G92 Toxic encephalopathy: Secondary | ICD-10-CM

## 2019-07-16 DIAGNOSIS — N179 Acute kidney failure, unspecified: Secondary | ICD-10-CM

## 2019-07-16 DIAGNOSIS — I480 Paroxysmal atrial fibrillation: Secondary | ICD-10-CM

## 2019-07-16 DIAGNOSIS — K7010 Alcoholic hepatitis without ascites: Secondary | ICD-10-CM

## 2019-07-16 LAB — GLUCOSE, CAPILLARY
Glucose-Capillary: 167 mg/dL — ABNORMAL HIGH (ref 70–99)
Glucose-Capillary: 134 mg/dL — ABNORMAL HIGH (ref 70–99)
Glucose-Capillary: 168 mg/dL — ABNORMAL HIGH (ref 70–99)
Glucose-Capillary: 150 mg/dL — ABNORMAL HIGH (ref 70–99)
Glucose-Capillary: 124 mg/dL — ABNORMAL HIGH (ref 70–99)
Glucose-Capillary: 120 mg/dL — ABNORMAL HIGH (ref 70–99)

## 2019-07-16 LAB — HEPATIC FUNCTION PANEL
ALT: 21 U/L (ref 0–44)
AST: 18 U/L (ref 15–41)
Albumin: 2 g/dL — ABNORMAL LOW (ref 3.5–5.0)
Alkaline Phosphatase: 48 U/L (ref 38–126)
Bilirubin, Direct: 0.3 mg/dL — ABNORMAL HIGH (ref 0.0–0.2)
Indirect Bilirubin: 0.6 mg/dL (ref 0.3–0.9)
Total Bilirubin: 0.9 mg/dL (ref 0.3–1.2)
Total Protein: 4.5 g/dL — ABNORMAL LOW (ref 6.5–8.1)

## 2019-07-16 LAB — HEPARIN LEVEL (UNFRACTIONATED)
Heparin Unfractionated: 0.29 IU/mL — ABNORMAL LOW (ref 0.30–0.70)
Heparin Unfractionated: 0.14 IU/mL — ABNORMAL LOW (ref 0.30–0.70)
Heparin Unfractionated: 0.26 IU/mL — ABNORMAL LOW (ref 0.30–0.70)

## 2019-07-16 LAB — HEMOGLOBIN A1C
Hgb A1c MFr Bld: 5.4 % (ref 4.8–5.6)
Mean Plasma Glucose: 108.28 mg/dL

## 2019-07-16 LAB — BASIC METABOLIC PANEL
Anion gap: 4 — ABNORMAL LOW (ref 5–15)
Anion gap: 6 (ref 5–15)
BUN: 34 mg/dL — ABNORMAL HIGH (ref 8–23)
BUN: 30 mg/dL — ABNORMAL HIGH (ref 8–23)
CO2: 19 mmol/L — ABNORMAL LOW (ref 22–32)
CO2: 19 mmol/L — ABNORMAL LOW (ref 22–32)
Calcium: 8.9 mg/dL (ref 8.9–10.3)
Calcium: 8.7 mg/dL — ABNORMAL LOW (ref 8.9–10.3)
Chloride: 122 mmol/L — ABNORMAL HIGH (ref 98–111)
Chloride: 118 mmol/L — ABNORMAL HIGH (ref 98–111)
Creatinine, Ser: 0.75 mg/dL (ref 0.44–1.00)
Creatinine, Ser: 0.7 mg/dL (ref 0.44–1.00)
GFR calc Af Amer: >60 mL/min (ref >60)
GFR calc Af Amer: >60 mL/min (ref >60)
GFR calc non Af Amer: >60 mL/min (ref >60)
GFR calc non Af Amer: >60 mL/min (ref >60)
Glucose, Bld: 159 mg/dL — ABNORMAL HIGH (ref 70–99)
Glucose, Bld: 132 mg/dL — ABNORMAL HIGH (ref 70–99)
Potassium: 3.5 mmol/L (ref 3.5–5.1)
Potassium: 3.8 mmol/L (ref 3.5–5.1)
Sodium: 145 mmol/L (ref 135–145)
Sodium: 143 mmol/L (ref 135–145)

## 2019-07-16 LAB — CBC
HCT: 32.5 % — ABNORMAL LOW (ref 36.0–46.0)
Hemoglobin: 10.6 g/dL — ABNORMAL LOW (ref 12.0–15.0)
MCH: 34.6 pg — ABNORMAL HIGH (ref 26.0–34.0)
MCHC: 32.6 g/dL (ref 30.0–36.0)
MCV: 106.2 fL — ABNORMAL HIGH (ref 80.0–100.0)
Platelets: 113 10*3/uL — ABNORMAL LOW (ref 150–400)
RBC: 3.06 MIL/uL — ABNORMAL LOW (ref 3.87–5.11)
RDW: 14.1 % (ref 11.5–15.5)
WBC: 10.9 10*3/uL — ABNORMAL HIGH (ref 4.0–10.5)
nRBC: 1.2 % — ABNORMAL HIGH (ref 0.0–0.2)

## 2019-07-16 LAB — MAGNESIUM: Magnesium: 1.6 mg/dL — ABNORMAL LOW (ref 1.7–2.4)

## 2019-07-16 LAB — CARBON MONOXIDE, BLOOD (PERFORMED AT REF LAB): Carbon Monoxide, Blood: 6.3 % — ABNORMAL HIGH (ref 0.0–3.6)

## 2019-07-16 LAB — VITAMIN D 25 HYDROXY (VIT D DEFICIENCY, FRACTURES): Vit D, 25-Hydroxy: 50.98 ng/mL (ref 30–100)

## 2019-07-16 LAB — PROCALCITONIN: Procalcitonin: 0.46 ng/mL

## 2019-07-16 LAB — PHOSPHORUS: Phosphorus: 2 mg/dL — ABNORMAL LOW (ref 2.5–4.6)

## 2019-07-16 MED ORDER — FREE WATER
200.0000 mL | Freq: Four times a day (QID) | Status: DC
  Administered 2019-07-16 – 2019-07-18 (×8): 200 mL

## 2019-07-16 MED ORDER — AMIODARONE IV BOLUS ONLY 150 MG/100ML
150.0000 mg | Freq: Once | INTRAVENOUS | Status: AC
  Administered 2019-07-16: 150 mg via INTRAVENOUS

## 2019-07-16 MED ORDER — MAGNESIUM SULFATE 2 GM/50ML IV SOLN
2.0000 g | Freq: Once | INTRAVENOUS | Status: AC
  Administered 2019-07-16: 2 g via INTRAVENOUS
  Filled 2019-07-16: qty 50

## 2019-07-16 MED ORDER — AMIODARONE IV BOLUS ONLY 150 MG/100ML
INTRAVENOUS | Status: AC
  Filled 2019-07-16: qty 100

## 2019-07-16 MED ORDER — AMIODARONE HCL IN DEXTROSE 360-4.14 MG/200ML-% IV SOLN
60.0000 mg/h | INTRAVENOUS | Status: AC
  Administered 2019-07-16 (×3): 60 mg/h via INTRAVENOUS
  Filled 2019-07-16 (×2): qty 200

## 2019-07-16 MED ORDER — AMIODARONE HCL IN DEXTROSE 360-4.14 MG/200ML-% IV SOLN
30.0000 mg/h | INTRAVENOUS | Status: DC
  Administered 2019-07-16 – 2019-07-17 (×2): 30 mg/h via INTRAVENOUS
  Filled 2019-07-16: qty 200

## 2019-07-16 MED ORDER — AMIODARONE LOAD VIA INFUSION
150.0000 mg | Freq: Once | INTRAVENOUS | Status: AC
  Administered 2019-07-16: 150 mg via INTRAVENOUS
  Filled 2019-07-16: qty 83.34

## 2019-07-16 MED ORDER — ORAL CARE MOUTH RINSE
15.0000 mL | Freq: Two times a day (BID) | OROMUCOSAL | Status: DC
  Administered 2019-07-16 – 2019-07-26 (×21): 15 mL via OROMUCOSAL

## 2019-07-16 MED ORDER — LACTATED RINGERS IV BOLUS
500.0000 mL | Freq: Once | INTRAVENOUS | Status: AC
  Administered 2019-07-16: 500 mL via INTRAVENOUS

## 2019-07-16 MED ORDER — AMIODARONE IV BOLUS ONLY 150 MG/100ML
150.0000 mg | Freq: Once | INTRAVENOUS | Status: AC
  Administered 2019-07-16: 150 mg via INTRAVENOUS
  Filled 2019-07-16: qty 100

## 2019-07-16 MED ORDER — POTASSIUM PHOSPHATES 15 MMOLE/5ML IV SOLN
30.0000 mmol | Freq: Once | INTRAVENOUS | Status: AC
  Administered 2019-07-16: 30 mmol via INTRAVENOUS
  Filled 2019-07-16: qty 10

## 2019-07-16 NOTE — Progress Notes (Signed)
Brief Pharmacy Consult Note - IV heparin for Afib  Labs: heparin level 0.14  A/P: heparin level SUBtherapeutic (goal 0.3-0.7) and decreased despite increase in rate earlier today from 950 units/hr to 1000 units/hr. Per RN, there has been no problems with IV site and no interruptions in therapy and she confirmed that lab drew HL via venipuncture and not from patient's central line that heparin is also infusing. Increase IV heparin from 1000 units/hr to 1200 units/hr. Recheck heparin level 6 hours after rate increase  Adrian Saran, PharmD, BCPS 07/16/2019 4:19 PM

## 2019-07-16 NOTE — Progress Notes (Signed)
eLink Physician-Brief Progress Note Patient Name: Anna [name] DOB: Aug 18, 1951 MRN: [id number]   Date of Service  07/16/2019  HPI/Events of Note  Multiple issues: 1. Patient NPO not able to get Cellcept and 2. Mg++ = 1.6 and Creatinine = 0.7.  eICU Interventions  Will order: 1. Replace Mg++. 2. Place gastric tube to give Cellcept.      Intervention Category Major Interventions: Electrolyte abnormality - evaluation and management  Anna [name] 07/16/2019, 10:45 PM

## 2019-07-16 NOTE — Progress Notes (Signed)
ANTICOAGULATION CONSULT NOTE - Follow Up Consult  Pharmacy Consult for heparin Indication: atrial fibrillation with RVR  Allergies  Allergen Reactions  . Iodine-131 Hives and Shortness Of Breath  . 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 Penicillin G Rash    Has patient had a PCN reaction causing immediate rash, facial/tongue/throat swelling, SOB or lightheadedness with hypotension: Yes Has patient had a PCN reaction causing severe rash involving mucus membranes or skin necrosis: Unk Has patient had a PCN reaction that required hospitalization: Unk Has patient had a PCN reaction occurring within the last 10 years: No If all of the above answers are "NO", then may proceed with Cephalosporin use.     Patient Measurements: Height: 5\' 4"  (162.6 cm) Weight: 180 lb 12.4 oz (82 kg) IBW/kg (Calculated) : 54.7 Heparin Dosing Weight: 72 kg  Vital Signs: Temp: 97.7 F (36.5 C) (11/09 0400) Temp Source: Axillary (11/09 0400) BP: 113/84 (11/09 0700) Pulse Rate: 82 (11/09 0700)  Labs: Recent Labs    07/14/19 0755 07/14/19 1130 07/14/19 1800 07/14/19 2330 07/15/19 0400 07/16/19 0500  HGB  --   --   --  11.1* 10.8* 10.6*  HCT  --   --   --  33.4* 33.1* 32.5*  PLT  --   --   --  142* 139* 113*  LABPROT  --   --   --   --  19.0*  --   INR  --   --   --   --  1.6*  --   HEPARINUNFRC 0.62  --   --   --  0.60 0.29*  CREATININE  --   --  0.97 0.96  --  0.75  TROPONINIHS  --  54*  --   --  26*  --     Estimated Creatinine Clearance: 69.7 mL/min (by C-G formula based on SCr of 0.75 mg/dL).   Assessment: Patient's a 68 y.o F with hx renal transplant presented to the ED on 11/5 after she was found unresponsive by EMS.  Head CT showed no acute findings.  She was subsequently intubated and started on heparin drip for Afib with RVR.  Today, 07/16/2019: - heparin level is slightly sub-therapeutic at 0.29 - hgb stable; plts down 113 (was 185 on adm-  07/12/19) - no bleeding documented - in afib  Goal of Therapy:  Heparin level 0.3-0.7 units/ml Monitor platelets by anticoagulation protocol: Yes   Plan:  - increase heparin drip to 1000 units/hr - check 6 hr heparin level - monitor for s/s bleeding - monitor plts closely  Bless Lisenby P 07/16/2019,8:19 AM

## 2019-07-16 NOTE — Progress Notes (Signed)
Patient extubation on hold per CCM. Will reassess at later time.

## 2019-07-16 NOTE — Progress Notes (Signed)
eLink Physician-Brief Progress Note Patient Name: Anna [name] DOB: 1950-10-08 MRN: [id number]   Date of Service  07/16/2019  HPI/Events of Note  AFIB with RVR - Ventricular rat = 113 to 132. Currently on an Amiodarone IV infusion.   eICU Interventions  Will order: 1. Bolus with Amiodarone 150 mg IV over 10 minutes now.  2. BMP and Mg++ level STAT.      Intervention Category Major Interventions: Arrhythmia - evaluation and management  Sommer,Steven Eugene 07/16/2019, 9:27 PM

## 2019-07-16 NOTE — Procedures (Signed)
Extubation Procedure Note  Patient Details:   Name: Anna [name] DOB: [birth year] MRN: [id number]   Airway Documentation:  Airway 7.5 mm (Active)  Secured at (cm) 24 cm 07/16/19 1054  Measured From Lips 07/16/19 1054  Secured Location Left 07/16/19 1054  Secured By [employer] 07/16/19 1054  Tube Holder Repositioned Yes 07/16/19 1054  Cuff Pressure (cm H2O) 24 cm H2O 07/15/19 2014  Site Condition Dry 07/16/19 1054   Vent end date: 07/16/19 Vent end time: 1432   Evaluation  O2 sats: stable throughout Complications: No apparent complications Patient did tolerate procedure well. Bilateral Breath Sounds: Diminished   Yes   No stridor noted and cuff leak present; on 4 L [place] with O2 sats 100%.   Lamonte Sakai 07/16/2019, 2:34 PM

## 2019-07-16 NOTE — Progress Notes (Signed)
NAME:  Anna [name], MRN:  [id number], DOB:  24-Sep-1950, LOS: 4 ADMISSION DATE:  07/12/2019, CONSULTATION DATE:  07/13/19  REFERRING MD:  Maylene Roes  CHIEF COMPLAINT:  AMS   Brief History   Eleyna Brugh is a 68 y.o. female who has a PMH includingasthma, HTN, renal transplant, EtOH abuse.  She presented to [facility] ED 11/5 after she was found in her home down and minimally responsive.  This was when she was found and it was noted that she had tylenol pills and empty alcohol bottles around her.  SpO2 was in 60's and SBP in 80's with EMS. She was brought to ED for further workup.  In ED, head CT was neg, EtOH, tylenol, ASA levels all normal.  She was noted to be in A.fib with RVR with HR in low 100's.    PCCM was subsequently called in consultation and she was intubated for airway protection on 11/06  Past Medical History  HTN, asthma, renal transplant (on cellcept and prograf).  Significant Hospital Events   11/5 > admit. 11/6 > intubated. 11/7 - Per RN who d/with Daughter [name] - poatient drinking 6 x 1.5L of wine per week., Last seen/heard 1 week perior to admission. On day of admit - wellness check revelaed unconscious patients surrounded by lot of bottles of empty wine, hypotensive and hypoxemic. Currently - fio2 40% on vent, on levophed 71mcg -> 74mcg, in A Fib RVR with IV heparin but no amio/dig only lopressor prn.  Low Ur Op but creat better. Improving responsiveness - not on sedation. Echo with ef 55%  Consults:  PCCM.  Procedures:  ETT 11/6 >  R IJ CVL 11/6 >   Significant Diagnostic Tests:  CT head / C-Spine 11/6 > atrophy with chronic microvascular disease.  No acute process. MRI brain 11/6 >  Echo 11/6 >  Ef 55% U tox and blood tox 11/5 2020 - neg  Micro Data:  Blood 11/6 > negative Sputum 11/6 > negative Urine 11/6 > negative SARS CoV2 11/6 > negative  Antimicrobials:   Vanc 11/5 - 11/7 Levoflox 11/6 - 11/6 Aztreonam 11/6 -    Interim history/subjective:    07/16/2019 -  On low dose norepinephrine and vasopressin this morning.  Off sedation awake.  Passing SBT. Low urine output. Tube feeds at goal. RASS -2 as she can't hold eye contact for greater than 10 seconds.   Objective:  Blood pressure 113/84, pulse 82, temperature 97.7 F (36.5 C), temperature source Axillary, resp. rate 18, height 5\' 4"  (1.626 m), weight 82 kg, SpO2 100 %. CVP:  [10 mmHg-26 mmHg] 12 mmHg  Vent Mode: PRVC FiO2 (%):  [30 %] 30 % Set Rate:  [16 bmp] 16 bmp Vt Set:  [430 mL] 430 mL PEEP:  [5 cmH20] 5 cmH20 Pressure Support:  [5 cmH20] 5 cmH20 Plateau Pressure:  [14 cmH20-16 cmH20] 16 cmH20   Intake/Output Summary (Last 24 hours) at 07/16/2019 0806 Last data filed at 07/16/2019 0600 Gross per 24 hour  Intake 2228.34 ml  Output 955 ml  Net 1273.34 ml   Filed Weights   07/14/19 2000 07/15/19 0434 07/16/19 0500  Weight: 80 kg 79 kg 82 kg   General Appearance:  Intubated, sedated Eyes:  Anicteric sclera Mouth:  ETT TUBE - yes , OG tube - yes Neck:  Supple,  No enlargement/tenderness/nodules Lungs: Clear to auscultation bilaterally, Ventilator   Synchrony - yes, no wheezes or crackles Heart:  Irregularly irregular Abdomen:  Soft, no masses, no organomegaly  Extremities:  Extremities- intact Skin: right shoulder and hip with skin breakdown present on admission Neurologic: opens eyes to name.   Assessment & Plan:   Acute encephalopathy  - likely toxic, metabolic, gradually improving but still requires mechanical ventilation for airway protection.  - MRI not done due to multiple vasoactive agents, will wean as belwo - discontinue benzos, use precedex preferentially for sedation with PRN fentanyl  Acute Resp Failure Intubated for airway protection SBT today Consider extubation pending mental status   Circulatory shock - likely septic vs hypovolemic - cultures negative - will wean norepi today, then vasopressin - discontinue MIVF   A.fib RVR - no known hx.  ECHo ef 55%. hS trop 54-> 26 Rate controlled Continue heparin gtt  AKI Improved, uop still low Add free H20 to tube feeds  Hypophosphatemia  Replete with kphos  S/p renal transplant plan - Continue preadmission cellcept and prograf. - Check levels.  Hx EtOH abuse. - Thiamine / folate. - monitor for signs of etoh withdrawal  Acute Alcoholic Hepatitis - transaminitis resolved - no cirrhosis on RUQ - did not receive steroids   The patient is critically ill with multiple organ systems failure and requires high complexity decision making for assessment and support, frequent evaluation and titration of therapies, application of advanced monitoring technologies and extensive interpretation of multiple databases.   Critical Care Time devoted to patient care services described in this note is 48 minutes. This time reflects time of care of this signee Charlott Holler . This critical care time does not reflect separately billable procedures or procedure time, teaching time or supervisory time of PA/NP/Med student/Med Resident etc but could involve care discussion time.  Mickel Baas Pulmonary and Critical Care Medicine 07/16/2019 8:38 AM  Pager: [phone] After hours pager: [phone]   Best Practice:  Diet: tube feeds at goal.  Pain/Anxiety/Delirium protocol (if indicated): fentanyl PRN /precedex VAP protocol (if indicated): In place. DVT prophylaxis: SCD's / Heparin. GI prophylaxis: PPI. Glucose control: accuchecks with ssi Mobility: Bedrest. Code Status:  Partial Disposition: ICu - will update family   LABS    PULMONARY Recent Labs  Lab 07/12/19 2249 07/13/19 1608 07/14/19 0444  PHART  --  7.310* 7.379  PCO2ART  --  29.2* 29.2*  PO2ART  --  102.0 155*  HCO3 17.8* 14.8* 16.7*  TCO2 19* 16*  --   O2SAT 94.0 98.0 98.8    CBC Recent Labs  Lab 07/14/19 2330 07/15/19 0400 07/16/19 0500  HGB 11.1* 10.8* 10.6*  HCT 33.4* 33.1* 32.5*  WBC 10.9*  9.8 10.9*  PLT 142* 139* 113*    COAGULATION Recent Labs  Lab 07/12/19 2242 07/15/19 0400  INR 1.3* 1.6*    CARDIAC  No results for input(s): TROPONINI in the last 168 hours. No results for input(s): PROBNP in the last 168 hours.   CHEMISTRY Recent Labs  Lab 07/13/19 0438 07/13/19 1608 07/14/19 0200 07/14/19 1252 07/14/19 1700 07/14/19 1800 07/14/19 2330 07/15/19 0400 07/15/19 1700 07/16/19 0500  NA 148* 147* 148*  --   --  144 145  --   --  145  K 2.9* 2.8* 4.6  --   --  4.5 4.0  --   --  3.5  CL 119*  --  122*  --   --  123* 123*  --   --  122*  CO2 17*  --  18*  --   --  17* 18*  --   --  19*  GLUCOSE 81  --  120*  --   --  152* 234*  --   --  159*  BUN 39*  --  31*  --   --  26* 31*  --   --  34*  CREATININE 1.16*  --  0.92  --   --  0.97 0.96  --   --  0.75  CALCIUM 7.8*  --  8.6*  --   --  8.3* 8.4*  --   --  8.9  MG 1.6*  --  2.1 1.9 1.7  --   --  1.8 1.9  --   PHOS 2.8  --  1.8* 1.6* 2.6  --   --  1.9*  --  2.0*   Estimated Creatinine Clearance: 69.7 mL/min (by C-G formula based on SCr of 0.75 mg/dL).   LIVER Recent Labs  Lab 07/12/19 2013 07/12/19 2242 07/13/19 0438 07/14/19 0200 07/14/19 1130 07/15/19 0400 07/16/19 0500  AST 74*  --  45* 42* 33  --  18  ALT 56*  --  33 31 25  --  21  ALKPHOS 65  --  46 53 46  --  48  BILITOT 1.8*  --  1.7* 1.4* 1.5*  --  0.9  PROT 6.3*  --  3.8* 4.5* 4.1*  --  4.5*  ALBUMIN 3.4*  --  2.1* 2.3* 2.0*  --  2.0*  INR  --  1.3*  --   --   --  1.6*  --      INFECTIOUS Recent Labs  Lab 07/13/19 0610 07/13/19 1406 07/14/19 1130 07/15/19 0400 07/16/19 0500  LATICACIDVEN 2.3* 1.6  --  1.6  --   PROCALCITON  --   --  0.30 0.34 0.46     ENDOCRINE CBG (last 3)  Recent Labs    07/15/19 2304 07/16/19 0308 07/16/19 0752  GLUCAP 178* 167* 134*

## 2019-07-16 NOTE — Progress Notes (Signed)
Pharmacy Antibiotic Note  Anna [name] is a 68 y.o. female with hx renal transplant presented to the ED on 11/5 after she was found unresponsive by EMS.  Head CT showed no acute findings.  She was started on vancomycin and levaquin on admission for suspected sepsis and changed aztreonam on 11/6.    - rash with PCN  Today, 07/16/2019: - day #4 abx - afeb, wbc 10.9 - scr ok (crcl~69) -  PCT up 0.46 - all cultures have been negative thus far  Plan: - continue aztreonam 2 gm IV q8h - monitor renal function - f/u cultures  _____________________________________________ Height: 5\' 4"  (162.6 cm) Weight: 180 lb 12.4 oz (82 kg) IBW/kg (Calculated) : 54.7  Temp (24hrs), Avg:97.8 F (36.6 C), Min:97 F (36.1 C), Max:98.5 F (36.9 C)  Recent Labs  Lab 07/12/19 2013 07/12/19 2141 07/13/19 0438 07/13/19 0610 07/13/19 1406 07/14/19 0200 07/14/19 1800 07/14/19 2330 07/15/19 0400 07/16/19 0500  WBC 13.4*  --  11.1*  --   --  14.4*  --  10.9* 9.8 10.9*  CREATININE 1.19*  --  1.16*  --   --  0.92 0.97 0.96  --  0.75  LATICACIDVEN 3.4* 3.3*  --  2.3* 1.6  --   --   --  1.6  --     Estimated Creatinine Clearance: 69.7 mL/min (by C-G formula based on SCr of 0.75 mg/dL).    Allergies  Allergen Reactions  . Iodine-131 Hives and Shortness Of Breath  . 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 Penicillin G Rash    Has patient had a PCN reaction causing immediate rash, facial/tongue/throat swelling, SOB or lightheadedness with hypotension: Yes Has patient had a PCN reaction causing severe rash involving mucus membranes or skin necrosis: Unk Has patient had a PCN reaction that required hospitalization: Unk Has patient had a PCN reaction occurring within the last 10 years: No If all of the above answers are "NO", then may proceed with Cephalosporin use.     Antimicrobials this admission:  11/5 vanc x1 11/6 LVQ x1 11/6 aztreonam>>  Microbiology  results:  11/5 BCx x1:  11/5 UCx: neg FINAL 11/6 covid - negative 11/6 MRSA PCR: negative  Thank you for allowing pharmacy to be a part of this patient's care.  Lynelle Doctor 07/16/2019 8:37 AM

## 2019-07-16 NOTE — Progress Notes (Signed)
Attempted to get pt down for MRI. RN stated pt is too unstable to come down, told RN we would cancel the MRI and when pt is able to come down they can put the order back in.

## 2019-07-17 ENCOUNTER — Inpatient Hospital Stay (HOSPITAL_COMMUNITY): Payer: Medicare Other

## 2019-07-17 DIAGNOSIS — F10231 Alcohol dependence with withdrawal delirium: Secondary | ICD-10-CM

## 2019-07-17 LAB — CULTURE, BLOOD (ROUTINE X 2)
Culture: NO GROWTH
Special Requests: ADEQUATE

## 2019-07-17 LAB — BASIC METABOLIC PANEL
Anion gap: 6 (ref 5–15)
BUN: 28 mg/dL — ABNORMAL HIGH (ref 8–23)
CO2: 19 mmol/L — ABNORMAL LOW (ref 22–32)
Calcium: 8.7 mg/dL — ABNORMAL LOW (ref 8.9–10.3)
Chloride: 116 mmol/L — ABNORMAL HIGH (ref 98–111)
Creatinine, Ser: 0.6 mg/dL (ref 0.44–1.00)
GFR calc Af Amer: >60 mL/min (ref >60)
GFR calc non Af Amer: >60 mL/min (ref >60)
Glucose, Bld: 148 mg/dL — ABNORMAL HIGH (ref 70–99)
Potassium: 3.6 mmol/L (ref 3.5–5.1)
Sodium: 141 mmol/L (ref 135–145)

## 2019-07-17 LAB — GLUCOSE, CAPILLARY
Glucose-Capillary: 108 mg/dL — ABNORMAL HIGH (ref 70–99)
Glucose-Capillary: 110 mg/dL — ABNORMAL HIGH (ref 70–99)
Glucose-Capillary: 117 mg/dL — ABNORMAL HIGH (ref 70–99)
Glucose-Capillary: 122 mg/dL — ABNORMAL HIGH (ref 70–99)
Glucose-Capillary: 130 mg/dL — ABNORMAL HIGH (ref 70–99)
Glucose-Capillary: 137 mg/dL — ABNORMAL HIGH (ref 70–99)

## 2019-07-17 LAB — CBC
HCT: 30.4 % — ABNORMAL LOW (ref 36.0–46.0)
Hemoglobin: 9.8 g/dL — ABNORMAL LOW (ref 12.0–15.0)
MCH: 34.4 pg — ABNORMAL HIGH (ref 26.0–34.0)
MCHC: 32.2 g/dL (ref 30.0–36.0)
MCV: 106.7 fL — ABNORMAL HIGH (ref 80.0–100.0)
Platelets: 89 10*3/uL — ABNORMAL LOW (ref 150–400)
RBC: 2.85 MIL/uL — ABNORMAL LOW (ref 3.87–5.11)
RDW: 14.6 % (ref 11.5–15.5)
WBC: 9 10*3/uL (ref 4.0–10.5)
nRBC: 0.9 % — ABNORMAL HIGH (ref 0.0–0.2)

## 2019-07-17 LAB — MAGNESIUM: Magnesium: 2.1 mg/dL (ref 1.7–2.4)

## 2019-07-17 LAB — HEPARIN LEVEL (UNFRACTIONATED): Heparin Unfractionated: 0.53 IU/mL (ref 0.30–0.70)

## 2019-07-17 LAB — PHOSPHORUS: Phosphorus: 2.4 mg/dL — ABNORMAL LOW (ref 2.5–4.6)

## 2019-07-17 MED ORDER — POTASSIUM CHLORIDE 20 MEQ PO PACK
20.0000 meq | PACK | Freq: Once | ORAL | Status: AC
  Administered 2019-07-17: 10:00:00 20 meq
  Filled 2019-07-17: qty 1

## 2019-07-17 MED ORDER — DIGOXIN 0.25 MG/ML IJ SOLN
0.2500 mg | Freq: Four times a day (QID) | INTRAMUSCULAR | Status: DC
  Administered 2019-07-17 – 2019-07-18 (×3): 0.25 mg via INTRAVENOUS
  Filled 2019-07-17 (×3): qty 2

## 2019-07-17 MED ORDER — DIGOXIN 0.25 MG/ML IJ SOLN
0.5000 mg | Freq: Once | INTRAMUSCULAR | Status: AC
  Administered 2019-07-17: 0.5 mg via INTRAVENOUS
  Filled 2019-07-17: qty 2

## 2019-07-17 MED ORDER — AMIODARONE IV BOLUS ONLY 150 MG/100ML
150.0000 mg | Freq: Once | INTRAVENOUS | Status: AC
  Administered 2019-07-17: 150 mg via INTRAVENOUS
  Filled 2019-07-17: qty 100

## 2019-07-17 MED ORDER — POTASSIUM CHLORIDE 10 MEQ/50ML IV SOLN
10.0000 meq | INTRAVENOUS | Status: AC
  Administered 2019-07-17 (×4): 10 meq via INTRAVENOUS
  Filled 2019-07-17 (×4): qty 50

## 2019-07-17 MED ORDER — TACROLIMUS 1 MG/ML ORAL SUSPENSION
1.0000 mg | Freq: Every evening | ORAL | Status: DC
  Administered 2019-07-17 – 2019-07-18 (×2): 1 mg
  Filled 2019-07-17 (×2): qty 1

## 2019-07-17 MED ORDER — VITAL AF 1.2 CAL PO LIQD
1000.0000 mL | ORAL | Status: DC
  Administered 2019-07-17: 1000 mL

## 2019-07-17 NOTE — Progress Notes (Signed)
[facility] Progress Note Patient Name: Daylene Vandenbosch DOB: 1951-04-24 MRN: [id number]   Date of Service  07/17/2019  HPI/Events of Note  K+ = 3.6, Mg++ = 2.1 and Creatinine = 0.6.  eICU Interventions  Will replace K+.      Intervention Category Major Interventions: Electrolyte abnormality - evaluation and management  Raiden Haydu Eugene 07/17/2019, 4:46 AM

## 2019-07-17 NOTE — Progress Notes (Signed)
PT Cancellation Note  Patient Details Name: Anna [name] MRN: [id number] DOB: Feb 17, 1951   Cancelled Treatment:    Reason Eval/Treat Not Completed: Medical issues which prohibited therapy. Pt with elevated HR 130s to 160s this am. Defer PT at this time and continue efforts.    [facility] 07/17/2019, 12:31 PM

## 2019-07-17 NOTE — Progress Notes (Signed)
eLink Physician-Brief Progress Note Patient Name: Anna [name] DOB: 1951/07/01 MRN: [id number]   Date of Service  07/17/2019  HPI/Events of Note  AFIB with RVR - Ventricular rate = 124. Currently on an Amiodarone IV infusion.   eICU Interventions  Will order: 1. Amiodarone 150 mg IV over 10 minutes now.  2. BMP and Mg++ level STAT.      Intervention Category Major Interventions: Arrhythmia - evaluation and management  Dewon Mendizabal Eugene 07/17/2019, 3:57 AM

## 2019-07-17 NOTE — Progress Notes (Signed)
Patient's afib remains elevated despite ordered digoxin. Heart rate stays between 120-150s, and irregular. Dr. Shearon Stalls notified; orders received to continue to monitor heart rate. Per MD, pt will most likely start BBs tomorrow. RN will continue to monitor pt.

## 2019-07-17 NOTE — Progress Notes (Signed)
Since PT was unable to work with pt today, RN let patient sit up in the bed in a chair position. Pt tolerated it well, with no increase in HR or SOB. RN will continue to monitor and assess pt.

## 2019-07-17 NOTE — Progress Notes (Signed)
NAME:  Anna [name], MRN:  [id number], DOB:  [birth year], LOS: 5 ADMISSION DATE:  07/12/2019, CONSULTATION DATE:  07/13/19  REFERRING MD:  Maylene Roes  CHIEF COMPLAINT:  AMS   Brief History   Anna [name] is a 68 y.o. female who has a PMH includingasthma, HTN, renal transplant, EtOH abuse.  She presented to [facility] ED 11/5 after she was found in her home down and minimally responsive.  This was when she was found and it was noted that she had tylenol pills and empty alcohol bottles around her.  SpO2 was in 60's and SBP in 80's with EMS. She was brought to ED for further workup.  In ED, head CT was neg, EtOH, tylenol, ASA levels all normal.  She was noted to be in A.fib with RVR with HR in low 100's.    PCCM was subsequently called in consultation and she was intubated for airway protection on 11/06. Extubated  Past Medical History  HTN, asthma, renal transplant (on cellcept and prograf).  Significant Hospital Events   11/5 > admit. 11/6 > intubated. 11/7 - Per RN who d/with Daughter [name] - poatient drinking 6 x 1.5L of wine per week., Last seen/heard 1 week perior to admission. On day of admit - wellness check revelaed unconscious patients surrounded by lot of bottles of empty wine, hypotensive and hypoxemic.  11/09 extubated, amiodarone gtt started  Consults:  PCCM.  Procedures:  ETT 11/6 > 11/09 R IJ CVL 11/6 >   Significant Diagnostic Tests:  CT head / C-Spine 11/6 > atrophy with chronic microvascular disease.  No acute process. MRI brain 11/6 >  Echo 11/6 >  Ef 55% U tox and blood tox 11/5 2020 - neg  Micro Data:  Blood 11/6 > negative Sputum 11/6 > negative Urine 11/6 > negative SARS CoV2 11/6 > negative  Antimicrobials:   Vanc 11/5 - 11/7 Levoflox 11/6 - 11/6 Aztreonam 11/6 - 11/10  Interim history/subjective:   Awake, tremulous. Overnight electrolytes replaced and rebolused with amiodarone.   Objective:  Blood pressure 112/70, pulse 65, temperature 98 F (36.7 C),  temperature source Axillary, resp. rate (!) 29, height 5\' 4"  (1.626 m), weight 84.5 kg, SpO2 100 %. CVP:  [10 mmHg-11 mmHg] 10 mmHg  Vent Mode: PRVC FiO2 (%):  [30 %] 30 % Set Rate:  [20 bmp] 20 bmp Vt Set:  [430 mL] 430 mL PEEP:  [5 cmH20] 5 cmH20 Pressure Support:  [5 cmH20] 5 cmH20 Plateau Pressure:  [14 cmH20] 14 cmH20   Intake/Output Summary (Last 24 hours) at 07/17/2019 6967 Last data filed at 07/17/2019 8938 Gross per 24 hour  Intake 2640.65 ml  Output 550 ml  Net 2090.65 ml   Filed Weights   07/15/19 0434 07/16/19 0500 07/17/19 0500  Weight: 79 kg 82 kg 84.5 kg   General Appearance: resting comfortably, no distress Eyes:  Anicteric sclera Mouth: mmm Neck:  Supple,  No enlargement/tenderness/nodules Lungs: Clear to auscultation bilaterally, no increased RR, diminished in the bases Heart:  Irregularly irregular Abdomen:  Soft, no masses, no organomegaly Extremities:  Extremities- intact Skin: right shoulder and hip with skin breakdown present on admission Neurologic: awake, alert, oriented. tremulous  Assessment & Plan:   Acute encephalopathy  - improving, off continuous sedation but see below  Alcohol Use Disorder - patient more tremulous this morning and in the window for etoh withdrawal. - closely monitor for signs, may need to initiate ciwa - thiamine, folate supplementation - daughter notes hospitalization for etoh withdrawal  twice a year for the last ten years. Recently she was in inpatient rehab and started drinking a week after discharge.   Acute Alcoholic Hepatitis - transaminitis resolved - no cirrhosis on RUQ - did not receive steroids  Circulatory shock - likely septic vs hypovolemic - cultures negative, will d/c aztreonam empiric abx - remains on and off low dose pressors. Will discontinue vasopresin today.   A.fib RVR - no known hx. ECHo ef 55%. hS trop 54-> 26 On amiodarone gtt with multiple boluses Goal K>4, Mag>2 Continue heparin gtt -  levels have not been therapeutic, will discuss with pharmacy  S/p renal transplant - Continue preadmission cellcept and prograf. - receiving through NG  Acute Resp Failure Intubated 11/06 to 11/09 Likely secondary to mental status Wean oxygen as tolerated goal sats over 90%  The patient is critically ill with multiple organ systems failure and requires high complexity decision making for assessment and support, frequent evaluation and titration of therapies, application of advanced monitoring technologies and extensive interpretation of multiple databases.   Critical Care Time devoted to patient care services described in this note is 46 minutes. This time reflects time of care of this signee Charlott HollerNikita S Christino Mcglinchey . This critical care time does not reflect separately billable procedures or procedure time, teaching time or supervisory time of PA/NP/Med student/Med Resident etc but could involve care discussion time.  Mickel BaasNikita S Katharin Schneider [facility] 07/17/2019 8:12 AM  Pager: [phone] After hours pager: [phone]   Best Practice:  Diet: tube feeds at goal.  Pain/Anxiety/Delirium protocol (if indicated): precedex VAP protocol (if indicated): n/a DVT prophylaxis: SCD's / Heparin. GI prophylaxis: PPI. Glucose control: accuchecks with ssi Mobility: activity with assist. Code Status:  Partial Disposition: ICU - updated daughter [name] this morning.   LABS    PULMONARY Recent Labs  Lab 07/12/19 2249 07/13/19 1608 07/14/19 0444  PHART  --  7.310* 7.379  PCO2ART  --  29.2* 29.2*  PO2ART  --  102.0 155*  HCO3 17.8* 14.8* 16.7*  TCO2 19* 16*  --   O2SAT 94.0 98.0 98.8    CBC Recent Labs  Lab 07/14/19 2330 07/15/19 0400 07/16/19 0500  HGB 11.1* 10.8* 10.6*  HCT 33.4* 33.1* 32.5*  WBC 10.9* 9.8 10.9*  PLT 142* 139* 113*    COAGULATION Recent Labs  Lab 07/12/19 2242 07/15/19 0400  INR 1.3* 1.6*    CARDIAC  No results for input(s):  TROPONINI in the last 168 hours. No results for input(s): PROBNP in the last 168 hours.   CHEMISTRY Recent Labs  Lab 07/14/19 1252 07/14/19 1700 07/14/19 1800 07/14/19 2330 07/15/19 0400 07/15/19 1700 07/16/19 0500 07/16/19 2148 07/17/19 0355  NA  --   --  144 145  --   --  145 143 141  K  --   --  4.5 4.0  --   --  3.5 3.8 3.6  CL  --   --  123* 123*  --   --  122* 118* 116*  CO2  --   --  17* 18*  --   --  19* 19* 19*  GLUCOSE  --   --  152* 234*  --   --  159* 132* 148*  BUN  --   --  26* 31*  --   --  34* 30* 28*  CREATININE  --   --  0.97 0.96  --   --  0.75 0.70 0.60  CALCIUM  --   --  8.3* 8.4*  --   --  8.9 8.7* 8.7*  MG 1.9 1.7  --   --  1.8 1.9  --  1.6* 2.1  PHOS 1.6* 2.6  --   --  1.9*  --  2.0*  --  2.4*   Estimated Creatinine Clearance: 70.8 mL/min (by C-G formula based on SCr of 0.6 mg/dL).   LIVER Recent Labs  Lab 07/12/19 2013 07/12/19 2242 07/13/19 0438 07/14/19 0200 07/14/19 1130 07/15/19 0400 07/16/19 0500  AST 74*  --  45* 42* 33  --  18  ALT 56*  --  33 31 25  --  21  ALKPHOS 65  --  46 53 46  --  48  BILITOT 1.8*  --  1.7* 1.4* 1.5*  --  0.9  PROT 6.3*  --  3.8* 4.5* 4.1*  --  4.5*  ALBUMIN 3.4*  --  2.1* 2.3* 2.0*  --  2.0*  INR  --  1.3*  --   --   --  1.6*  --      INFECTIOUS Recent Labs  Lab 07/13/19 0610 07/13/19 1406 07/14/19 1130 07/15/19 0400 07/16/19 0500  LATICACIDVEN 2.3* 1.6  --  1.6  --   PROCALCITON  --   --  0.30 0.34 0.46     ENDOCRINE CBG (last 3)  Recent Labs    07/16/19 1941 07/16/19 2310 07/17/19 0326  GLUCAP 124* 120* 108*

## 2019-07-17 NOTE — Progress Notes (Signed)
ANTICOAGULATION CONSULT NOTE - Follow Up Consult  Pharmacy Consult for heparin Indication: atrial fibrillation with RVR  Allergies  Allergen Reactions  . Iodine-131 Hives and Shortness Of Breath  . 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 Penicillin G Rash    Has patient had a PCN reaction causing immediate rash, facial/tongue/throat swelling, SOB or lightheadedness with hypotension: Yes Has patient had a PCN reaction causing severe rash involving mucus membranes or skin necrosis: Unk Has patient had a PCN reaction that required hospitalization: Unk Has patient had a PCN reaction occurring within the last 10 years: No If all of the above answers are "NO", then may proceed with Cephalosporin use.     Patient Measurements: Height: 5\' 4"  (162.6 cm) Weight: 186 lb 4.6 oz (84.5 kg) IBW/kg (Calculated) : 54.7 Heparin Dosing Weight: 72 kg  Vital Signs: Temp: 98 F (36.7 C) (11/10 0400) Temp Source: Axillary (11/10 0400) BP: 112/70 (11/10 0617) Pulse Rate: 65 (11/10 0617)  Labs: Recent Labs    07/14/19 1130  07/14/19 2330 07/15/19 0400 07/16/19 0500 07/16/19 1508 07/16/19 2148 07/16/19 2310 07/17/19 0355  HGB  --    < > 11.1* 10.8* 10.6*  --   --   --   --   HCT  --   --  33.4* 33.1* 32.5*  --   --   --   --   PLT  --   --  142* 139* 113*  --   --   --   --   LABPROT  --   --   --  19.0*  --   --   --   --   --   INR  --   --   --  1.6*  --   --   --   --   --   HEPARINUNFRC  --   --   --  0.60 0.29* 0.14*  --  0.26*  --   CREATININE  --    < > 0.96  --  0.75  --  0.70  --  0.60  TROPONINIHS 54*  --   --  26*  --   --   --   --   --    < > = values in this interval not displayed.    Estimated Creatinine Clearance: 70.8 mL/min (by C-G formula based on SCr of 0.6 mg/dL).   Assessment: Patient's a 68 y.o F with hx renal transplant presented to the ED on 11/5 after she was found unresponsive by EMS.  Head CT showed no acute findings.  She  was subsequently intubated and started on heparin drip for Afib with RVR.  Today, 07/17/2019: - heparin level is  therapeutic at 0.53 - hgb down slightly 10.6 - plts down 89 (was 185 on adm- 07/12/19); 4Ts score 2-3 (low probability) - no bleeding documented - in afib  Goal of Therapy:  Heparin level 0.3-0.7 units/ml Monitor platelets by anticoagulation protocol: Yes   Plan:  -  Continue heparin drip at 1300 units/hr - daily heparin level - monitor for s/s bleeding - monitor plts closely  Schawn Byas P 07/17/2019,7:42 AM

## 2019-07-17 NOTE — Progress Notes (Signed)
RN noticed patient's HR sustaining in the 130s-160's, afib rhythm. RN notified Dr. Shearon Stalls. Orders placed for RN to discontinue the amiodarone and vasopressin, and begin digoxin IVP. RN will continue to monitor pt response.

## 2019-07-17 NOTE — Plan of Care (Signed)
RN will continue to monitor patient's progression with care plan. 

## 2019-07-17 NOTE — Progress Notes (Signed)
Brief Pharmacy Consult Note - IV heparin for Afib  Labs: heparin level 0.26  A/P: heparin level improved but still SUBtherapeutic (goal 0.3-0.7) on heparin infusion at 1200 units/hr. Per RN, there has been no problems with IV site and no interruptions in therapy and no bleeding.   Increase IV heparin to 1300 units/hr. Recheck heparin level 6 hours after rate increase.  Netta Cedars, PharmD, BCPS 07/17/2019 12:05 AM

## 2019-07-17 NOTE — Progress Notes (Signed)
eLink Physician-Brief Progress Note Patient Name: Anna [name] DOB: Jan 04, 1951 MRN: [id number]   Date of Service  07/17/2019  HPI/Events of Note  Review of Abdominal film s/p gastric tube placement reveals gastric tube tip to be in distal stomach.   eICU Interventions  OK to use gastric tube.      Intervention Category Major Interventions: Other:  Yaeko Fazekas Cornelia Copa 07/17/2019, 2:31 AM

## 2019-07-17 NOTE — Progress Notes (Signed)
Nutrition Follow-up  DOCUMENTATION CODES:   Not applicable  INTERVENTION:   Tube feeding via NG tube: - Vital AF 1.2 @ 60 ml/hr (1440 ml/day) - Free water per MD, currently 200 ml QID  Tube feeding regimen and current free water provides 1728 kcal, 108 grams of protein, and 1968 ml of H2O.   NUTRITION DIAGNOSIS:   Inadequate oral intake related to inability to eat as evidenced by NPO status.  Ongoing, being addressed via TF  GOAL:   Patient will meet greater than or equal to 90% of their needs  Met via TF  MONITOR:   Diet advancement, Labs, Weight trends, TF tolerance, Skin, I & O's  REASON FOR ASSESSMENT:   Consult, Ventilator Enteral/tube feeding initiation and management  ASSESSMENT:   68 y.o. female who has a PMH including but not limited to asthma, HTN, renal transplant, EtOH abuse.  She presented to [facility] ED 11/5 after she was found in her home down and minimally responsive.  11/05 - admitted 11/06 - intubated with OGT 11/09 - extubated, NGT placed  Pt remains NPO with NGT in place.  Weight up significant since admission. Will utilize weight of 72 kg as EDW and monitor trends. Pt with edema to BUE and BLE.  RD will adjust TF now that pt has been extubated.  Current TF: Vital AF 1.2 @ 50 ml/hr, Pro-stat 30 ml daily, free water 200 ml QID  Medications reviewed and include: folic acid, SSI, Protonix, Klor-con, thiamine, amiodarone, heparin, IV KCl 10 mEq x 4 runs, vasopressin  Labs reviewed: phosphorus 2.4, hemoglobin 9.8 CBG's: 108-168 x 24 hours  UOP: 550 ml x 24 hours  Diet Order:   Diet Order            Diet NPO time specified  Diet effective now              EDUCATION NEEDS:   No education needs have been identified at this time  Skin:  Skin Assessment: Skin Integrity Issues: Skin Integrity Issues: DTI: Per WOC note: right posterior thoracic chest, right buttock, left buttock near anus, left posterior thigh  Last BM:  07/16/19 type  6  Height:   Ht Readings from Last 1 Encounters:  07/12/19 '5\' 4"'$  (1.626 m)    Weight:   Wt Readings from Last 1 Encounters:  07/17/19 84.5 kg    Ideal Body Weight:  54.5 kg  BMI:  Body mass index is 31.98 kg/m.  Estimated Nutritional Needs:   Kcal:  1700-1900  Protein:  90-110  Fluid:  1.7-1.9 L    Gaynell Face, MS, RD, LDN Inpatient Clinical Dietitian Pager: [phone] Weekend/After Hours: [phone]

## 2019-07-18 DIAGNOSIS — L8990 Pressure ulcer of unspecified site, unspecified stage: Secondary | ICD-10-CM | POA: Insufficient documentation

## 2019-07-18 DIAGNOSIS — Z7289 Other problems related to lifestyle: Secondary | ICD-10-CM

## 2019-07-18 LAB — COMPREHENSIVE METABOLIC PANEL
ALT: 20 U/L (ref 0–44)
AST: 13 U/L — ABNORMAL LOW (ref 15–41)
Albumin: 1.8 g/dL — ABNORMAL LOW (ref 3.5–5.0)
Alkaline Phosphatase: 50 U/L (ref 38–126)
Anion gap: 3 — ABNORMAL LOW (ref 5–15)
BUN: 18 mg/dL (ref 8–23)
CO2: 21 mmol/L — ABNORMAL LOW (ref 22–32)
Calcium: 8.3 mg/dL — ABNORMAL LOW (ref 8.9–10.3)
Chloride: 117 mmol/L — ABNORMAL HIGH (ref 98–111)
Creatinine, Ser: 0.46 mg/dL (ref 0.44–1.00)
GFR calc Af Amer: >60 mL/min (ref >60)
GFR calc non Af Amer: >60 mL/min (ref >60)
Glucose, Bld: 131 mg/dL — ABNORMAL HIGH (ref 70–99)
Potassium: 4.2 mmol/L (ref 3.5–5.1)
Sodium: 141 mmol/L (ref 135–145)
Total Bilirubin: 1.1 mg/dL (ref 0.3–1.2)
Total Protein: 4.3 g/dL — ABNORMAL LOW (ref 6.5–8.1)

## 2019-07-18 LAB — GLUCOSE, CAPILLARY
Glucose-Capillary: 130 mg/dL — ABNORMAL HIGH (ref 70–99)
Glucose-Capillary: 114 mg/dL — ABNORMAL HIGH (ref 70–99)
Glucose-Capillary: 134 mg/dL — ABNORMAL HIGH (ref 70–99)
Glucose-Capillary: 143 mg/dL — ABNORMAL HIGH (ref 70–99)
Glucose-Capillary: 135 mg/dL — ABNORMAL HIGH (ref 70–99)
Glucose-Capillary: 127 mg/dL — ABNORMAL HIGH (ref 70–99)

## 2019-07-18 LAB — CBC
HCT: 30.8 % — ABNORMAL LOW (ref 36.0–46.0)
Hemoglobin: 9.9 g/dL — ABNORMAL LOW (ref 12.0–15.0)
MCH: 34.5 pg — ABNORMAL HIGH (ref 26.0–34.0)
MCHC: 32.1 g/dL (ref 30.0–36.0)
MCV: 107.3 fL — ABNORMAL HIGH (ref 80.0–100.0)
Platelets: 110 10*3/uL — ABNORMAL LOW (ref 150–400)
RBC: 2.87 MIL/uL — ABNORMAL LOW (ref 3.87–5.11)
RDW: 14.9 % (ref 11.5–15.5)
WBC: 11.2 10*3/uL — ABNORMAL HIGH (ref 4.0–10.5)
nRBC: 0.5 % — ABNORMAL HIGH (ref 0.0–0.2)

## 2019-07-18 LAB — HEPARIN LEVEL (UNFRACTIONATED)
Heparin Unfractionated: 0.24 IU/mL — ABNORMAL LOW (ref 0.30–0.70)
Heparin Unfractionated: 0.26 IU/mL — ABNORMAL LOW (ref 0.30–0.70)
Heparin Unfractionated: 0.4 IU/mL (ref 0.30–0.70)

## 2019-07-18 LAB — PHOSPHORUS: Phosphorus: 2 mg/dL — ABNORMAL LOW (ref 2.5–4.6)

## 2019-07-18 MED ORDER — POTASSIUM PHOSPHATES 15 MMOLE/5ML IV SOLN
30.0000 mmol | Freq: Once | INTRAVENOUS | Status: AC
  Administered 2019-07-18: 30 mmol via INTRAVENOUS
  Filled 2019-07-18: qty 10

## 2019-07-18 MED ORDER — ASPIRIN 81 MG PO CHEW
81.0000 mg | CHEWABLE_TABLET | Freq: Once | ORAL | Status: AC
  Administered 2019-07-18: 81 mg
  Filled 2019-07-18: qty 1

## 2019-07-18 MED ORDER — PANTOPRAZOLE SODIUM 40 MG PO PACK: 40.0000 mg | PACK | Freq: Every day | ORAL | Status: DC

## 2019-07-18 MED ORDER — DIGOXIN 125 MCG PO TABS
0.2500 mg | ORAL_TABLET | Freq: Every day | ORAL | Status: DC
  Administered 2019-07-19 – 2019-07-20 (×2): 0.25 mg via ORAL
  Filled 2019-07-18 (×2): qty 2

## 2019-07-18 MED ORDER — MYCOPHENOLATE 200 MG/ML ORAL SUSPENSION
500.0000 mg | Freq: Two times a day (BID) | ORAL | Status: DC
  Administered 2019-07-19 – 2019-07-26 (×13): 500 mg via ORAL
  Filled 2019-07-18 (×16): qty 10

## 2019-07-18 MED ORDER — TACROLIMUS 1 MG/ML ORAL SUSPENSION
1.0000 mg | Freq: Every evening | ORAL | Status: DC
  Administered 2019-07-19 – 2019-07-25 (×7): 1 mg via ORAL
  Filled 2019-07-18 (×8): qty 1

## 2019-07-18 MED ORDER — ACETAMINOPHEN 325 MG PO TABS
650.0000 mg | ORAL_TABLET | ORAL | Status: DC | PRN
  Administered 2019-07-18 – 2019-07-24 (×4): 650 mg via ORAL
  Filled 2019-07-18 (×4): qty 2

## 2019-07-18 MED ORDER — ATENOLOL 25 MG PO TABS
50.0000 mg | ORAL_TABLET | Freq: Two times a day (BID) | ORAL | Status: DC
  Administered 2019-07-18: 50 mg
  Filled 2019-07-18: qty 2

## 2019-07-18 MED ORDER — PANTOPRAZOLE SODIUM 40 MG PO PACK
40.0000 mg | PACK | Freq: Every day | ORAL | Status: DC
  Administered 2019-07-18: 40 mg
  Filled 2019-07-18: qty 20

## 2019-07-18 MED ORDER — HEPARIN (PORCINE) 25000 UT/250ML-% IV SOLN
1600.0000 [IU]/h | INTRAVENOUS | Status: DC
  Administered 2019-07-18 – 2019-07-19 (×2): 1600 [IU]/h via INTRAVENOUS
  Filled 2019-07-18: qty 250

## 2019-07-18 MED ORDER — BOOST / RESOURCE BREEZE PO LIQD CUSTOM
1.0000 | Freq: Three times a day (TID) | ORAL | Status: DC
  Administered 2019-07-18 – 2019-07-22 (×13): 1 via ORAL

## 2019-07-18 MED ORDER — BUPROPION HCL ER (XL) 300 MG PO TB24
300.0000 mg | ORAL_TABLET | Freq: Every day | ORAL | Status: DC
  Administered 2019-07-18 – 2019-07-26 (×9): 300 mg via ORAL
  Filled 2019-07-18 (×9): qty 1

## 2019-07-18 MED ORDER — AMLODIPINE BESYLATE 5 MG PO TABS
5.0000 mg | ORAL_TABLET | Freq: Every day | ORAL | Status: DC
  Administered 2019-07-18: 5 mg via ORAL
  Filled 2019-07-18: qty 1

## 2019-07-18 MED ORDER — GABAPENTIN 100 MG PO CAPS
100.0000 mg | ORAL_CAPSULE | Freq: Three times a day (TID) | ORAL | Status: DC
  Administered 2019-07-18 – 2019-07-26 (×24): 100 mg via ORAL
  Filled 2019-07-18 (×24): qty 1

## 2019-07-18 MED ORDER — DIGOXIN 125 MCG PO TABS: 0.2500 mg | ORAL_TABLET | Freq: Every day | ORAL | Status: DC

## 2019-07-18 MED ORDER — ATENOLOL 25 MG PO TABS
50.0000 mg | ORAL_TABLET | Freq: Two times a day (BID) | ORAL | Status: DC
  Administered 2019-07-18: 50 mg via ORAL
  Filled 2019-07-18: qty 2

## 2019-07-18 MED ORDER — TRAZODONE HCL 50 MG PO TABS
150.0000 mg | ORAL_TABLET | Freq: Every day | ORAL | Status: DC
  Administered 2019-07-18: 150 mg via ORAL
  Filled 2019-07-18: qty 3

## 2019-07-18 NOTE — Evaluation (Signed)
Physical Therapy Evaluation Patient Details Name: Anna [name] MRN: [id number] DOB: Apr 13, 1951 Today's Date: 07/18/2019   History of Present Illness  Pt is 68 y.o. female, with history of HTN, asthma, and polysubstance abuse.  Pt was found minmally responsive in her home with pills and empty alcohol bottles around her.  She admitted with metabolic encephalopathy, polysubstance abuse, hypothermia.  Pt was intubated on 07/13/19 and extubated 07/16/19. Has had afib with RVR.  Clinical Impression   Pt admitted with above diagnosis. Pt was not able to get OOB due to confused, not assisting, and max A x2 just for rolling.  Pt demonstrates profound weakness throughout and limited ability to follow commands.  Pt with history of depression per her daughter. Pt currently with functional limitations due to the deficits listed below (see PT Problem List). Pt will benefit from skilled PT to increase her independence and safety with mobility to allow discharge to the venue listed below.       Follow Up Recommendations SNF    Equipment Recommendations  None recommended by PT    Recommendations for Other Services       Precautions / Restrictions Precautions Precautions: Fall Precaution Comments: heparin drip; triple lumen Restrictions Weight Bearing Restrictions: No      Mobility  Bed Mobility Overal bed mobility: Needs Assistance Bed Mobility: Rolling Rolling: +2 for physical assistance;Max assist         General bed mobility comments: cued and positioned to facilitate rolling;  Pt not following commands and unsafe to sit EOB due to not following commands, assisting, and max x 2 or more  Transfers                    Ambulation/Gait                Stairs            Wheelchair Mobility    Modified Rankin (Stroke Patients Only)       Balance Overall balance assessment: Needs assistance   Sitting balance-Leahy Scale: Zero       Standing balance-Leahy  Scale: Zero                               Pertinent Vitals/Pain Pain Assessment: No/denies pain    Home Living Family/patient expects to be discharged to:: Skilled nursing facility                      Prior Function           Comments: Pt unable to provide PLOF.  RN reports she spoke with daughter who reports pt ambulates in her home by "furniture surfing" and could perform ADLs.  Reports issues with depression and ETOH at home that limited her safety/mobility.     Hand Dominance        Extremity/Trunk Assessment   Upper Extremity Assessment Upper Extremity Assessment: Defer to OT evaluation    Lower Extremity Assessment Lower Extremity Assessment: LLE deficits/detail;RLE deficits/detail RLE Deficits / Details: PROM: WFL; MMT 1/5 throughout except 2/5 knee extension -Pt with limited participation LLE Deficits / Details: PROM: WFL; MMT 1/5 throughout except 2/5 knee extension -Pt with limited participation       Communication   Communication: Other (comment)(slow vs unwilling to respond)  Cognition Arousal/Alertness: Awake/alert Behavior During Therapy: Flat affect(limited responses) Overall Cognitive Status: Impaired/Different from baseline Area of Impairment: Orientation;Following commands;Awareness;Problem solving;Attention  Orientation Level: Disoriented to;Place;Time;Situation     Following Commands: Follows one step commands inconsistently     Problem Solving: Slow processing;Decreased initiation;Requires verbal cues;Requires tactile cues General Comments: Pt with limited responses to questions and following <10% commands      General Comments      Exercises General Exercises - Lower Extremity Ankle Circles/Pumps: AAROM;5 reps;Both Short Arc Quad: AAROM;Both;5 reps Heel Slides: AAROM;Both;5 reps   Assessment/Plan    PT Assessment Patient needs continued PT services  PT Problem List Decreased  strength;Decreased mobility;Decreased safety awareness;Decreased coordination;Decreased activity tolerance;Decreased cognition;Decreased balance;Decreased knowledge of use of DME       PT Treatment Interventions DME instruction;Therapeutic exercise;Balance training;Neuromuscular re-education;Functional mobility training;Therapeutic activities;Patient/family education(Gait training can be added at later date)    PT Goals (Current goals can be found in the Care Plan section)  Acute Rehab PT Goals Patient Stated Goal: unable to state PT Goal Formulation: Patient unable to participate in goal setting Time For Goal Achievement: 08/01/19 Potential to Achieve Goals: Fair Additional Goals Additional Goal #1: Will increase leg strength to 3/5 to assist with transfers    Frequency Min 2X/week   Barriers to discharge   hx polysubstance abuse    Co-evaluation PT/OT/SLP Co-Evaluation/Treatment: Yes Reason for Co-Treatment: For patient/therapist safety PT goals addressed during session: Mobility/safety with mobility;Strengthening/ROM         AM-PAC PT "6 Clicks" Mobility  Outcome Measure Help needed turning from your back to your side while in a flat bed without using bedrails?: Total Help needed moving from lying on your back to sitting on the side of a flat bed without using bedrails?: Total Help needed moving to and from a bed to a chair (including a wheelchair)?: Total Help needed standing up from a chair using your arms (e.g., wheelchair or bedside chair)?: Total Help needed to walk in hospital room?: Total Help needed climbing 3-5 steps with a railing? : Total 6 Click Score: 6    End of Session   Activity Tolerance: Patient limited by lethargy;Other (comment)(limited by confusion/willingness to participate) Patient left: in bed;with bed alarm set;with call bell/phone within reach;with SCD's reapplied Nurse Communication: Mobility status;Need for lift equipment PT Visit Diagnosis:  Muscle weakness (generalized) (M62.81);Difficulty in walking, not elsewhere classified (R26.2)    Time: 0623-7628 PT Time Calculation (min) (ACUTE ONLY): 30 min   Charges:   PT Evaluation $PT Eval Moderate Complexity: 1 Mod          Maggie Font, PT Acute Rehab Services Pager [phone] [facility] [place] [facility] [phone] [facility] [phone]   Karlton Lemon 07/18/2019, 4:47 PM

## 2019-07-18 NOTE — Progress Notes (Signed)
NAME:  Anna [name], MRN:  [id number], DOB:  [birth year], LOS: 6 ADMISSION DATE:  07/12/2019, CONSULTATION DATE:  07/13/19  REFERRING MD:  Alvino Chapel  CHIEF COMPLAINT:  AMS   Brief History   Anna [name] is a 68 y.o. female who has a PMH includingasthma, HTN, renal transplant, EtOH abuse.  She presented to [facility] ED 11/5 after she was found in her home down and minimally responsive.  This was when she was found and it was noted that she had tylenol pills and empty alcohol bottles around her.  SpO2 was in 60's and SBP in 80's with EMS. She was brought to ED for further workup.  In ED, head CT was neg, EtOH, tylenol, ASA levels all normal.  She was noted to be in A.fib with RVR with HR in low 100's.    PCCM was subsequently called in consultation and she was intubated for airway protection on 11/06. Extubated  Past Medical History  HTN, asthma, renal transplant (on cellcept and prograf).  Significant Hospital Events   11/5 > admit. 11/6 > intubated. 11/7 - Per RN who d/with Daughter Darl Pikes - poatient drinking 6 x 1.5L of wine per week., Last seen/heard 1 week perior to admission. On day of admit - wellness check revelaed unconscious patients surrounded by lot of bottles of empty wine, hypotensive and hypoxemic.  11/09 extubated, amiodarone gtt started 11/11 still weak. AF better controlled. Adding atenolol. Change to SDU status. [facility] asked to assume care 11/12 Consults:  PCCM.  Procedures:  ETT 11/6 > 11/09 R IJ CVL 11/6 >   Significant Diagnostic Tests:  CT head / C-Spine 11/6 > atrophy with chronic microvascular disease.  No acute process. MRI brain 11/6 >  Echo 11/6 >  Ef 55% U tox and blood tox 11/5 2020 - neg  Micro Data:  Blood 11/6 > negative Sputum 11/6 > negative Urine 11/6 > negative SARS CoV2 11/6 > negative  Antimicrobials:   Vanc 11/5 - 11/7 Levoflox 11/6 - 11/6 Aztreonam 11/6 - 11/10  Interim history/subjective:   Appears comfortable.  Still tremulous.   Objective:  Blood pressure (Abnormal) 124/55, pulse 99, temperature 98.9 F (37.2 C), temperature source Axillary, resp. rate (Abnormal) 26, height 5\' 4"  (1.626 m), weight 87.8 kg, SpO2 100 %.        Intake/Output Summary (Last 24 hours) at 07/18/2019 0804 Last data filed at 07/18/2019 0700 Gross per 24 hour  Intake 631.42 ml  Output 850 ml  Net -218.58 ml   Filed Weights   07/16/19 0500 07/17/19 0500 07/18/19 0436  Weight: 82 kg 84.5 kg 87.8 kg   General this is an obese debilitated 50 yof who appears older than her stated age she is not in distress currently but remains somewhat tremulous. HEENT normocephalic atraumatic no jugular venous distention moist Pulmonary: Some scattered rhonchi no accessory use noted Cardiac: Irregular irregular tachycardic atrial fibrillation Abdomen: Soft nontender no organomegaly Extremities dry scattered areas of ecchymosis dependent edema. Neuro: Affect flat.  Tremulous.  Moves all extremities but generally weak.  Oriented x 3 GU cl yellow      Circulatory shock Acute alcoholic hepatitis (resolved w/out steroids)  Assessment & Plan:   Acute encephalopathy: 2/2 ETOH intoxication and hypercarbia (acutely), now w/ ETOH withdrawal (h/o ETOH use disorder) ->remains tremulous but oriented.  ->daughter notes hospitalization for etoh withdrawal twice a year for the last ten years. Recently she was in inpatient rehab and started drinking a week after discharge.  Plan Cont supportive  care  Cont thiamine and folate Low dose PRN benzos  A.fib RVR - no known hx. ECHo ef 55%. hS trop 54-> 26 Did not respond to [place] [facility] for another 48 hrs or so but try to convert to her home rx   Add back tenormin (will satert at 50mg  bid; takes 75mg ) Cont asa  Eventually add back norvasc  S/p renal transplant Plan Cont cellcept and prograf   Acute Resp Failure Intubated 11/06 to 11/09 -Cough mechanics remain weak, however she remains  extubated Portable chest x-ray personally from 11/9 infiltrates  oxygen requirements: 3 liters Plan Mobilize Pulse oximetry Wean oxygen for saturations greater than 90% Speech-language pathology evaluation, with aspiration and reflux precautions instituted.  Fluid and electrolyte imbalance: hypophosphatemia  Plan Replace PO4 Awaiting other am labs  Anemia w/out evidence of bleeding Mild thrombocytopenia  Plan Trend cbc Transfuse per protocol   Severe debilitation  Plan PT consult  Best Practice:  Diet: tube feeds at goal.  Pain/Anxiety/Delirium protocol (if indicated): precedex VAP protocol (if indicated): n/a DVT prophylaxis: SCD's / Heparin. GI prophylaxis: PPI. Glucose control: accuchecks with ssi Mobility: activity with assist. Code Status:  Partial Disposition: ICU: Resting in bed.  Tremulous but currently oriented.  Still in atrial fibrillation with rapid ventricular response but rate better controlled today.  She is severely deconditioned.  For today we will resume her beta-blockade at a reduced dosing, physical therapy has been ordered she needs to get out of bed.  We will have speech therapy see him for swallowing evaluation, though I think she should be able to take p.o. soon.  We will keep her on stepdown for 1 more day, will ask nursing her primary  Erick Colace ACNP-BC [place] Pager # [phone] OR # [phone] if no answer

## 2019-07-18 NOTE — Progress Notes (Signed)
ANTICOAGULATION CONSULT NOTE - Follow Up Consult  Pharmacy Consult for heparin Indication: atrial fibrillation with RVR  Allergies  Allergen Reactions  . Iodine-131 Hives and Shortness Of Breath  . 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 Penicillin G Rash    Has patient had a PCN reaction causing immediate rash, facial/tongue/throat swelling, SOB or lightheadedness with hypotension: Yes Has patient had a PCN reaction causing severe rash involving mucus membranes or skin necrosis: Unk Has patient had a PCN reaction that required hospitalization: Unk Has patient had a PCN reaction occurring within the last 10 years: No If all of the above answers are "NO", then may proceed with Cephalosporin use.     Patient Measurements: Height: 5\' 4"  (162.6 cm) Weight: 193 lb 9 oz (87.8 kg) IBW/kg (Calculated) : 54.7 Heparin Dosing Weight: 72 kg  Vital Signs: Temp: 98.3 F (36.8 C) (11/11 0800) Temp Source: Axillary (11/11 0800) BP: 121/61 (11/11 1100) Pulse Rate: 90 (11/11 1100)  Labs: Recent Labs    07/16/19 0500  07/16/19 2148 07/16/19 2310 07/17/19 0355 07/17/19 0800 07/17/19 0858 07/18/19 0425 07/18/19 0917  HGB 10.6*  --   --   --   --  9.8*  --  9.9*  --   HCT 32.5*  --   --   --   --  30.4*  --  30.8*  --   PLT 113*  --   --   --   --  89*  --  110*  --   HEPARINUNFRC 0.29*   < >  --  0.26*  --   --  0.53 0.24*  --   CREATININE 0.75  --  0.70  --  0.60  --   --   --  0.46   < > = values in this interval not displayed.    Estimated Creatinine Clearance: 72.1 mL/min (by C-G formula based on SCr of 0.46 mg/dL).   Assessment: Patient's a 68 y.o F with hx renal transplant presented to the ED on 11/5 after she was found unresponsive by EMS.  Head CT showed no acute findings.  She was subsequently intubated and started on heparin drip for Afib with RVR.  Today, 07/18/2019: - heparin level is SUBtherapeutic at 0.26 - hgb low but stable - plts  low but increasing today 110 - Per pt's RN, no bleeding documented or issues with heparin infusion  Goal of Therapy:  Heparin level 0.3-0.7 units/ml Monitor platelets by anticoagulation protocol: Yes   Plan:  - increase heparin drip to 1600 units/hr - Recheck heparin level in 6h after rate change - daily heparin level & CBC - monitor for s/s bleeding  Phyllicia Dudek P, PharmD, BCPS 07/18/2019,12:48 PM

## 2019-07-18 NOTE — Progress Notes (Signed)
ANTICOAGULATION CONSULT NOTE - Follow Up Consult  Pharmacy Consult for heparin Indication: atrial fibrillation with RVR  Allergies  Allergen Reactions  . Iodine-131 Hives and Shortness Of Breath  . 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 Penicillin G Rash    Has patient had a PCN reaction causing immediate rash, facial/tongue/throat swelling, SOB or lightheadedness with hypotension: Yes Has patient had a PCN reaction causing severe rash involving mucus membranes or skin necrosis: Unk Has patient had a PCN reaction that required hospitalization: Unk Has patient had a PCN reaction occurring within the last 10 years: No If all of the above answers are "NO", then may proceed with Cephalosporin use.     Patient Measurements: Height: 5\' 4"  (162.6 cm) Weight: 193 lb 9 oz (87.8 kg) IBW/kg (Calculated) : 54.7 Heparin Dosing Weight: 72 kg  Vital Signs: Temp: 98.9 F (37.2 C) (11/11 0400) Temp Source: Axillary (11/11 0400) BP: 120/69 (11/11 0500) Pulse Rate: 43 (11/11 0500)  Labs: Recent Labs    07/16/19 0500  07/16/19 2148 07/16/19 2310 07/17/19 0355 07/17/19 0800 07/17/19 0858 07/18/19 0425  HGB 10.6*  --   --   --   --  9.8*  --  9.9*  HCT 32.5*  --   --   --   --  30.4*  --  30.8*  PLT 113*  --   --   --   --  89*  --  110*  HEPARINUNFRC 0.29*   < >  --  0.26*  --   --  0.53 0.24*  CREATININE 0.75  --  0.70  --  0.60  --   --   --    < > = values in this interval not displayed.    Estimated Creatinine Clearance: 72.1 mL/min (by C-G formula based on SCr of 0.6 mg/dL).   Assessment: Patient's a 68 y.o F with hx renal transplant presented to the ED on 11/5 after she was found unresponsive by EMS.  Head CT showed no acute findings.  She was subsequently intubated and started on heparin drip for Afib with RVR.  Today, 07/18/2019: - heparin level is  SUBtherapeutic at 0.24 - hgb down slightly 9.9 - plts low but increasing today 110 - no  bleeding or infusion related issues reported by nursing  Goal of Therapy:  Heparin level 0.3-0.7 units/ml Monitor platelets by anticoagulation protocol: Yes   Plan:  - Increase heparin drip to 1400 units/hr - Recheck heparin level in 6h after rate change - daily heparin level & CBC - monitor for s/s bleeding  Netta Cedars, PharmD, BCPS 07/18/2019,5:33 AM

## 2019-07-18 NOTE — Progress Notes (Signed)
[facility] Progress Note Patient Name: Anna [name] DOB: 23-Dec-1950 MRN: [id number]   Date of Service  07/18/2019  HPI/Events of Note  Medications need to be changed to oral route from NG tube.  eICU Interventions  Medication route changed to oral from NG Tube.        Kerry Kass Ruhee Enck 07/18/2019, 9:33 PM

## 2019-07-18 NOTE — Progress Notes (Signed)
Nutrition Follow-up  RD working remotely.  DOCUMENTATION CODES:   Not applicable  INTERVENTION:   - Boost Breeze po TID, each supplement provides 250 kcal and 9 grams of protein  NUTRITION DIAGNOSIS:   Inadequate oral intake related to inability to eat as evidenced by NPO status.  Progressing, pt now on clear liquid diet  GOAL:   Patient will meet greater than or equal to 90% of their needs  Progressing  MONITOR:   PO intake, Supplement acceptance, Diet advancement, Labs, Weight trends, I & O's, Skin  REASON FOR ASSESSMENT:   Consult, Ventilator Enteral/tube feeding initiation and management  ASSESSMENT:   68 y.o. female who has a PMH including but not limited to asthma, HTN, renal transplant, EtOH abuse. She presented to [facility] ED 11/5 after she was found in her home down and minimally responsive.  11/05 - admitted 11/06 - intubated with OGT 11/09 - extubated, NGT placed 11/11 - NGT removed, diet advanced to clear liquids  SLP saw pt this AM with recommendations for full liquid diet. NGT removed. Pt currently with clear liquid diet order. RD will order clear liquid oral nutrition supplement to aid pt in meeting kcal and protein needs. Once diet advanced further, will transition to Ensure Enlive.  Weight up 54 lbs when compared to first measured weight on 11/05. Suspect weight gain related to positive fluid balance. Per RN edema assessment, pt with non-pitting edema to BUE and BLE.  Medications reviewed and include: folic acid, SSI, Protonix, thiamine, heparin, potassium phosphate 30 mmol once  Labs reviewed: phosphorus 2.0, hemoglobin 9.9 CBG's: 110-137 x 24 hours  UOP: 850 ml x 24 hours I/O's: +14.4 L since admit  Diet Order:   Diet Order            Diet clear liquid Room service appropriate? Yes; Fluid consistency: Thin  Diet effective now              EDUCATION NEEDS:   No education needs have been identified at this time  Skin:  Skin Assessment:  Skin Integrity Issues: Skin Integrity Issues: DTI: Per WOC note: right posterior thoracic chest, right buttock, left buttock near anus, left posterior thigh  Last BM:  07/18/19  Height:   Ht Readings from Last 1 Encounters:  07/12/19 5\' 4"  (1.626 m)    Weight:   Wt Readings from Last 1 Encounters:  07/18/19 87.8 kg    Ideal Body Weight:  54.5 kg  BMI:  Body mass index is 33.23 kg/m.  Estimated Nutritional Needs:   Kcal:  1700-1900  Protein:  90-110  Fluid:  1.7-1.9 L    Gaynell Face, MS, RD, LDN Inpatient Clinical Dietitian Pager: [phone] Weekend/After Hours: [phone]

## 2019-07-18 NOTE — Evaluation (Signed)
SLP Cancellation Note  Patient Details Name: Anna [name] MRN: [id number] DOB: Apr 05, 1951   Cancelled treatment:       Reason Eval/Treat Not Completed: Other (comment)(SlP attempted evaluation, RN came in and requested to see pt's bottom for skin assessment, then pt needed cleaned up - will continue efforts)   Macario Golds 07/18/2019, 9:37 AM   Luanna Salk, [place] SLP [place] Pager [phone] Office [phone]

## 2019-07-18 NOTE — Progress Notes (Signed)
OT Cancellation Note  Patient Details Name: Kanisha Duba MRN: [id number] DOB: 05/12/1951   Cancelled Treatment:     OT did A RN/PT with cleaning pt but pt dependent. . Pt did not participate with ADL activity. Will check on for appropriateness of OT eval next day  [name], [place] [phone] Office- [phone], Thereasa Parkin 07/18/2019, 4:45 PM

## 2019-07-18 NOTE — Progress Notes (Signed)
Brief Pharmacy Consult Note - IV heparin for Afib  Labs: heparin level 0.4  A/P: heparin level therapeutic (goal 0.3-0.7) for Afib on current IV heparin rate of 1600 units/hr. No reported bleeding. Continue current IV heparin rate. Recheck heparin level in 6 hours as confirmatory level  Adrian Saran, PharmD, BCPS 07/18/2019 7:26 PM

## 2019-07-18 NOTE — Evaluation (Signed)
Clinical/Bedside Swallow Evaluation Patient Details  Name: Anna [name] MRN: [id number] Date of Birth: Jun 01, 1951  Today's Date: 07/18/2019 Time: SLP Start Time (ACUTE ONLY): 0935 SLP Stop Time (ACUTE ONLY): 1005 SLP Time Calculation (min) (ACUTE ONLY): 30 min  Past Medical History:  Past Medical History:  Diagnosis Date  . Asthma   . Hypertension   . Renal disorder    Past Surgical History:  Past Surgical History:  Procedure Laterality Date  . CESAREAN SECTION    . NEPHRECTOMY TRANSPLANTED ORGAN     HPI:  pt is a 68 yo female adm to [facility] after being found down at home with wine bottles around her.  She reported to MD that she drinks 6 1.5 liters of wine a week.  She requiring intubation  11/6-11/02/2019.  Pt head CT negative, CXR stable.  Pt has an NG in place.  H/O renal transplant, ETOH use, HTN, asthma.  WBC 11.2 and HR has been elevated at times.  Pt also with h/o admit with hematemesis in 10/2017.   Assessment / Plan / Recommendation Clinical Impression  Pt with no focal CN deficits, mild dysphonia and weak cough noted.   She does have a large bore NG tube in place and this may have exacerbated possible mild dysphagia post-intubation.  She does not consistently stay alert during evaluation and RR up to 31 with shallow breathing. Subjectively pt with delays orally with variable swallow triggers from 6 to 14 seconds.  Initially pt presented with cough immediately post-swallow of ice chips - suspect loosened secretions retained in pharynx.  No further coughing with po observed including 2 single ice chips, 3 ounces nectar juice, 2 ounces applesauce, 1 inch size bite of graham cracker and tsp and straw boluses of thin.    Recommend consider removing NG tube and proceed with modified diet of full liquids initially due to pt's mentation.  Will follow up next date to assure tolerance of po diet, assess for readiness for dietary advancement and for pt/family education.  Pt denies  dysphagia prior to admit and states current swallow ability is normal.  She did state she sensed residuals intermittently during po intake that abated with cued dry swallow.  She will require full supervision and verbal cues to swallow prn.  SLP Visit Diagnosis: Dysphagia, oral phase (R13.11)    Aspiration Risk  Mild aspiration risk    Diet Recommendation Other (Comment)(full liqiuds)   Liquid Administration via: Straw;Spoon;Cup Medication Administration: Whole meds with puree Supervision: Full supervision/cueing for compensatory strategies;Staff to assist with self feeding Compensations: Slow rate;Small sips/bites Postural Changes: Seated upright at 90 degrees;Remain upright for at least 30 minutes after po intake    Other  Recommendations Oral Care Recommendations: Oral care BID   Follow up Recommendations (tbd)      Frequency and Duration min 2x/week  2 weeks       Prognosis Prognosis for Safe Diet Advancement: Good      Swallow Study   General Date of Onset: 07/18/19 HPI: pt is a 68 yo female adm to [facility] after being found down at home with wine bottles around her.  She reported to MD that she drinks 6 1.5 liters of wine a week.  She requiring intubation  11/6-11/02/2019.  Pt head CT negative, CXR stable.  Pt has an NG in place.  H/O renal transplant, ETOH use, HTN, asthma.  WBC 11.2 and HR has been elevated at times.  Pt also with h/o admit with hematemesis in 10/2017. Type  of Study: Bedside Swallow Evaluation Diet Prior to this Study: NPO;NG Tube Temperature Spikes Noted: No Respiratory Status: Nasal cannula History of Recent Intubation: Yes Length of Intubations (days): 3 days Date extubated: 07/16/19 Behavior/Cognition: Alert Oral Cavity Assessment: Within Functional Limits Oral Care Completed by SLP: Yes Oral Cavity - Dentition: Adequate natural dentition    Oral/Motor/Sensory Function Overall Oral Motor/Sensory Function: Generalized oral weakness   Ice Chips Ice  chips: Impaired Presentation: Spoon Pharyngeal Phase Impairments: Suspected delayed Swallow;Cough - Immediate Other Comments: suspect clearing pharyngeal secretions   Thin Liquid Thin Liquid: Impaired Presentation: Straw;Spoon Oral Phase Functional Implications: Prolonged oral transit Pharyngeal  Phase Impairments: Suspected delayed Swallow    Nectar Thick Nectar Thick Liquid: Impaired Presentation: Spoon;Straw Oral Phase Impairments: Reduced lingual movement/coordination Oral phase functional implications: Prolonged oral transit   Honey Thick Honey Thick Liquid: Not tested   Puree Puree: Impaired Oral Phase Impairments: Reduced lingual movement/coordination Oral Phase Functional Implications: Prolonged oral transit;Oral holding Other Comments: delays in swallow trigger with applesauce up to 14 seconds   Solid     Solid: Impaired Presentation: Self Fed Oral Phase Impairments: Reduced lingual movement/coordination;Impaired mastication Oral Phase Functional Implications: Prolonged oral transit;Impaired mastication;Oral holding Other Comments: delays subjectively oral with slow mastication but adequate clearance      Macario Golds 07/18/2019,10:20 AM  Anna Salk, MS [facility] SLP Acute Rehab Services Pager [phone] Office [phone]

## 2019-07-19 DIAGNOSIS — L8990 Pressure ulcer of unspecified site, unspecified stage: Secondary | ICD-10-CM

## 2019-07-19 DIAGNOSIS — I959 Hypotension, unspecified: Secondary | ICD-10-CM

## 2019-07-19 DIAGNOSIS — D696 Thrombocytopenia, unspecified: Secondary | ICD-10-CM

## 2019-07-19 DIAGNOSIS — Z9189 Other specified personal risk factors, not elsewhere classified: Secondary | ICD-10-CM

## 2019-07-19 DIAGNOSIS — F10239 Alcohol dependence with withdrawal, unspecified: Secondary | ICD-10-CM

## 2019-07-19 DIAGNOSIS — R5381 Other malaise: Secondary | ICD-10-CM

## 2019-07-19 DIAGNOSIS — R1312 Dysphagia, oropharyngeal phase: Secondary | ICD-10-CM

## 2019-07-19 DIAGNOSIS — E162 Hypoglycemia, unspecified: Secondary | ICD-10-CM

## 2019-07-19 DIAGNOSIS — D638 Anemia in other chronic diseases classified elsewhere: Secondary | ICD-10-CM

## 2019-07-19 LAB — COMPREHENSIVE METABOLIC PANEL
ALT: 22 U/L (ref 0–44)
AST: 18 U/L (ref 15–41)
Albumin: 1.8 g/dL — ABNORMAL LOW (ref 3.5–5.0)
Alkaline Phosphatase: 47 U/L (ref 38–126)
Anion gap: 4 — ABNORMAL LOW (ref 5–15)
BUN: 15 mg/dL (ref 8–23)
CO2: 23 mmol/L (ref 22–32)
Calcium: 8.5 mg/dL — ABNORMAL LOW (ref 8.9–10.3)
Chloride: 111 mmol/L (ref 98–111)
Creatinine, Ser: 0.47 mg/dL (ref 0.44–1.00)
GFR calc Af Amer: >60 mL/min (ref >60)
GFR calc non Af Amer: >60 mL/min (ref >60)
Glucose, Bld: 87 mg/dL (ref 70–99)
Potassium: 4.8 mmol/L (ref 3.5–5.1)
Sodium: 138 mmol/L (ref 135–145)
Total Bilirubin: 0.9 mg/dL (ref 0.3–1.2)
Total Protein: 4.1 g/dL — ABNORMAL LOW (ref 6.5–8.1)

## 2019-07-19 LAB — CBC
HCT: 27.6 % — ABNORMAL LOW (ref 36.0–46.0)
Hemoglobin: 8.8 g/dL — ABNORMAL LOW (ref 12.0–15.0)
MCH: 34.8 pg — ABNORMAL HIGH (ref 26.0–34.0)
MCHC: 31.9 g/dL (ref 30.0–36.0)
MCV: 109.1 fL — ABNORMAL HIGH (ref 80.0–100.0)
Platelets: 115 10*3/uL — ABNORMAL LOW (ref 150–400)
RBC: 2.53 MIL/uL — ABNORMAL LOW (ref 3.87–5.11)
RDW: 15.5 % (ref 11.5–15.5)
WBC: 7.9 10*3/uL (ref 4.0–10.5)
nRBC: 0.5 % — ABNORMAL HIGH (ref 0.0–0.2)

## 2019-07-19 LAB — GLUCOSE, CAPILLARY
Glucose-Capillary: 103 mg/dL — ABNORMAL HIGH (ref 70–99)
Glucose-Capillary: 120 mg/dL — ABNORMAL HIGH (ref 70–99)
Glucose-Capillary: 141 mg/dL — ABNORMAL HIGH (ref 70–99)
Glucose-Capillary: 48 mg/dL — ABNORMAL LOW (ref 70–99)
Glucose-Capillary: 67 mg/dL — ABNORMAL LOW (ref 70–99)
Glucose-Capillary: 84 mg/dL (ref 70–99)
Glucose-Capillary: 92 mg/dL (ref 70–99)
Glucose-Capillary: 95 mg/dL (ref 70–99)

## 2019-07-19 LAB — T4, FREE: Free T4: 0.79 ng/dL (ref 0.61–1.12)

## 2019-07-19 LAB — MAGNESIUM: Magnesium: 1.6 mg/dL — ABNORMAL LOW (ref 1.7–2.4)

## 2019-07-19 LAB — HEPARIN LEVEL (UNFRACTIONATED)
Heparin Unfractionated: 0.38 IU/mL (ref 0.30–0.70)
Heparin Unfractionated: 0.32 IU/mL (ref 0.30–0.70)

## 2019-07-19 LAB — CORTISOL-AM, BLOOD: Cortisol - AM: 10.4 ug/dL (ref 6.7–22.6)

## 2019-07-19 LAB — TSH: TSH: 3.123 u[IU]/mL (ref 0.350–4.500)

## 2019-07-19 MED ORDER — ALBUMIN HUMAN 5 % IV SOLN
INTRAVENOUS | Status: AC
  Administered 2019-07-19: 12.5 g via INTRAVENOUS
  Filled 2019-07-19: qty 250

## 2019-07-19 MED ORDER — VITAMIN B-1 100 MG PO TABS
100.0000 mg | ORAL_TABLET | Freq: Every day | ORAL | Status: DC
  Administered 2019-07-19 – 2019-07-26 (×8): 100 mg via ORAL
  Filled 2019-07-19 (×8): qty 1

## 2019-07-19 MED ORDER — MAGNESIUM SULFATE 2 GM/50ML IV SOLN
2.0000 g | Freq: Once | INTRAVENOUS | Status: AC
  Administered 2019-07-19: 2 g via INTRAVENOUS
  Filled 2019-07-19: qty 50

## 2019-07-19 MED ORDER — DILTIAZEM HCL 30 MG PO TABS
30.0000 mg | ORAL_TABLET | Freq: Four times a day (QID) | ORAL | Status: DC
  Administered 2019-07-19: 30 mg via ORAL
  Filled 2019-07-19: qty 1

## 2019-07-19 MED ORDER — FOLIC ACID 1 MG PO TABS
1.0000 mg | ORAL_TABLET | Freq: Every day | ORAL | Status: DC
  Administered 2019-07-19 – 2019-07-26 (×8): 1 mg via ORAL
  Filled 2019-07-19 (×8): qty 1

## 2019-07-19 MED ORDER — PANTOPRAZOLE SODIUM 40 MG PO TBEC
40.0000 mg | DELAYED_RELEASE_TABLET | Freq: Every day | ORAL | Status: DC
  Administered 2019-07-19 – 2019-07-26 (×8): 40 mg via ORAL
  Filled 2019-07-19 (×8): qty 1

## 2019-07-19 MED ORDER — HEPARIN (PORCINE) 25000 UT/250ML-% IV SOLN
1650.0000 [IU]/h | INTRAVENOUS | Status: DC
  Administered 2019-07-19 – 2019-07-20 (×2): 1650 [IU]/h via INTRAVENOUS
  Filled 2019-07-19 (×2): qty 250

## 2019-07-19 MED ORDER — DEXTROSE 50 % IV SOLN
25.0000 g | INTRAVENOUS | Status: AC
  Administered 2019-07-19: 25 g via INTRAVENOUS
  Filled 2019-07-19: qty 50

## 2019-07-19 MED ORDER — TRAZODONE HCL 100 MG PO TABS
100.0000 mg | ORAL_TABLET | Freq: Every day | ORAL | Status: DC
  Administered 2019-07-19 – 2019-07-25 (×7): 100 mg via ORAL
  Filled 2019-07-19: qty 1
  Filled 2019-07-19: qty 2
  Filled 2019-07-19 (×4): qty 1
  Filled 2019-07-19: qty 2

## 2019-07-19 MED ORDER — ALBUMIN HUMAN 5 % IV SOLN
25.0000 g | Freq: Once | INTRAVENOUS | Status: AC
  Administered 2019-07-19 (×2): 12.5 g via INTRAVENOUS
  Filled 2019-07-19: qty 250

## 2019-07-19 NOTE — Progress Notes (Signed)
  Speech Language Pathology Treatment: Dysphagia  Patient Details Name: Anna [name] MRN: [id number] DOB: Jan 10, 1951 Today's Date: 07/19/2019 Time: 6237-6283 SLP Time Calculation (min) (ACUTE ONLY): 15 min  Assessment / Plan / Recommendation Clinical Impression  Pt asleep upon SLP entrance to room but she did awaken to SLP verbal stimulation and accepted po intake.  subjectively with delayed swallow -suspect oral transit delay due to her mentation.  She does present with intermittent cough during session that did not appear immediately coorelated to po; RN reports pt has been coughing "a lot" today without intake and "a little" with intake.   Given pt voice continues to be mildly dysphonic and she appears with mild dyspnea with effort - recommend continue clear liquid diet.   Pt required moderate verbal/visual cues to maintain neutral positioning of head and to verbalize clinical reasoning for organizing bolus and swallowing rapidly when bolus is in oral cavity to decrease aspiration risk.  Anticipate pt will be able to tolerate diet advancement - will follow up next date.  Pt and RN educated and both agreeable.    HPI HPI: pt is a 68 yo female adm to [facility] after being found down at home with wine bottles around her.  She reported to MD that she drinks 6 1.5 liters of wine a week.  She requiring intubation  11/6-11/02/2019.  Pt head CT negative, CXR stable.  Pt has an NG in place.  H/O renal transplant, ETOH use, HTN, asthma.  WBC 11.2 and HR has been elevated at times.  Pt also with h/o admit with hematemesis in 10/2017.      SLP Plan  Continue with current plan of care       Recommendations  Diet recommendations: Thin liquid(clear liquids continued) Medication Administration: Whole meds with puree Compensations: Slow rate;Small sips/bites(cue pt to swallow as needed) Postural Changes and/or Swallow Maneuvers: Seated upright 90 degrees;Upright 30-60 min after meal                Oral  Care Recommendations: Oral care BID Follow up Recommendations: (tbd) SLP Visit Diagnosis: Dysphagia, oral phase (R13.11) Plan: Continue with current plan of care       GO                Macario Golds 07/19/2019, 7:15 PM  Luanna Salk, [facility] SLP [place] Pager [phone] Office [phone]

## 2019-07-19 NOTE — Progress Notes (Signed)
PROGRESS NOTE  Benjaman LobeKathleen Avans AVW:[id number] DOB: March 12, 1951   PCP: Patient, No Pcp Per  Patient is from: Home.  Per patient's daughter, lives alone in an apartment, independent for most ADL S, ambulates by holding to furniture's and fairly oriented.  Does not have walker or cane.  DOA: 07/12/2019 LOS: 7  Brief Narrative / Interim history: 68 year old female with history of asthma, HTN, renal transplant on immuno modulators and alcohol use presented 07/12/2019 after found down and minimally responsive at home.  It was noted that she had Tylenol pills and empty alcohol bottles around her when EMS found her.  She was hypoxic to 60s and hypotensive with SBP to 80s per EMS.  In ED, head CT and cervical spine were negative.  EtOH, Tylenol, ASA levels are normal.  She was noted to be in A. fib with RVR in low 100s.  She was also encephalopathic.  PCCM consulted 11/6 and patient was intubated.   In ICU, patient was treated for acute encephalopathy thought to be due to alcohol withdrawal, respiratory failure with hypercarbia and hypoxia and A. fib with RVR.  Infectious work-up including blood culture, urine culture, sputum culture and COVID-19 negative. Patient received empiric antibiotics from 11/5-11/10. Echo on 11/6 with EF of 55 to 60%, indeterminate DD and RVSP to 46 mmHg.  She was extubated to nasal cannula 11/9.  TRH assumed care 07/19/2019.  Subjective: She has had hypoglycemia to 48 this morning.  Received dextrose push and hypoglycemia improved.  She is on clear liquid diet.   Has no complaints but not a reliable historian.  She is only oriented to self.  She responds no to pain and other review of system questions.  Has she also had soft blood pressures.  Started on digoxin earlier this morning.    Objective: Vitals:   07/19/19 0539 07/19/19 0600 07/19/19 0800 07/19/19 0855  BP: (!) 95/49 (!) 89/46  (!) 93/47  Pulse: 99 76    Resp: (!) 22 (!) 22    Temp:   97.9 F (36.6 C)   TempSrc:    Oral   SpO2: 99% 99%    Weight:      Height:        Intake/Output Summary (Last 24 hours) at 07/19/2019 1122 Last data filed at 07/19/2019 0650 Gross per 24 hour  Intake 1042.21 ml  Output 725 ml  Net 317.21 ml   Filed Weights   07/17/19 0500 07/18/19 0436 07/19/19 0439  Weight: 84.5 kg 87.8 kg 88.5 kg    Examination:  GENERAL: No acute distress.  Lying in bed comfortably. HEENT: Dry oral mucosa.  Vision and hearing grossly intact.  NECK: Supple.  No apparent JVD.  RESP:  No IWOB.  Fair air movement bilaterally CVS: Irregular rhythm.  Normal rate. Heart sounds normal.  ABD/GI/GU: Bowel sounds present. Soft. Non tender.  Foley catheter in place. MSK/EXT:  No apparent deformity or edema. Moves extremities but bilateral lower extremity weakness. SKIN: Mild erythema over left forearm likely from previous IV lines. NEURO: Awake, alert but oriented only to self.  No apparent focal neuro deficit other than bilateral lower extremity weakness (3/5). PSYCH: Calm but disoriented.  Access: Right IJ CVL Foley catheter in place.  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  UDS negative.  Thyroid panel normal.  CT head without acute finding.  Should be outside withdrawal window now.  No obvious source of  infection but received empiric antibiotics 11/6-11/10. -Continue supportive care, multivitamins and thiamine's. -Treat treatable causes -Frequent reorientation and delirium precautions  A. fib with RVR: RVR resolved.  CHA2DS2-VASc score> 3.  Echo 07/12/2019 with EF of 55 to 60% and moderate PHTN to 46 mmHg -Discontinue atenolol in the setting of hypotension and history of asthma -Start Cardizem 30 mg every 6 hours -Change heparin to to NOAC after Hemoccult -Continue digoxin-check level in 4-5 days -Closely monitor electrolytes and replenish aggressively   Hypotension: Soft blood pressures this morning. -Hold home amlodipine and atenolol as above  Moderate pulmonary hypertension: Echo revealed RVSP to 46 mmHg. -Could benefit from Cardizem as above.  History of renal transplant on CellCept and Prograf: Renal function is stable.  Fair urine output. -Continue home CellCept and Prograf  Acute respiratory failure with hypoxia and hypercarbia: Likely due to encephalopathy and alcohol intoxication.  Has history of asthma but only takes as needed albuterol. -ETT 11/6-11/9 -Wean oxygen as able-currently saturating 100% on 2 L. -Continue as needed breathing treatments -Aspiration precaution  Post intubation dysphagia -SLP eval and recommendations -Continue clear liquid diet -Appreciate dietitian input  Hypoglycemia: Likely due to poor p.o. intake.   -Check cortisol level given hypoglycemia and hypotension.  Anemia of chronic disease: Baseline 12-13 -Hgb 18.1 (admit)>11.2 (next day)>>> 8.8 -Monitor for possible bleeding -Hemoccult and anemia panel  Thrombocytopenia: Likely due to alcohol and acute illness.  Improving -Continue monitoring  History of mood disorder -Continue home Wellbutrin -Reduce on trazodone to 100 mg daily.  Deconditioning/debility/generalized weakness: Far from baseline.  See above. -PT/OT eval  Pressure Injury 07/12/19 Buttocks Right Deep Tissue Injury - Purple or maroon localized area of discolored intact skin or blood-filled blister due to damage of underlying soft tissue from pressure and/or shear. Blister with three stripes and purple disco (Active)  07/12/19   Location: Buttocks  Location Orientation: Right  Staging: Deep Tissue Injury - Purple or maroon localized area of discolored intact skin or blood-filled blister due to damage of underlying soft tissue from pressure and/or shear.  Wound Description (Comments): Blister with three stripes and purple discoloration  Present on Admission: Yes     Pressure  Injury 07/12/19 Ischial tuberosity Lateral;Right Deep Tissue Injury - Purple or maroon localized area of discolored intact skin or blood-filled blister due to damage of underlying soft tissue from pressure and/or shear. open blister with purple  (Active)  07/12/19   Location: Ischial tuberosity  Location Orientation: Lateral;Right  Staging: Deep Tissue Injury - Purple or maroon localized area of discolored intact skin or blood-filled blister due to damage of underlying soft tissue from pressure and/or shear.  Wound Description (Comments): open blister with purple discolaration.  Present on Admission: Yes     Nutrition Problem: Inadequate oral intake Etiology: inability to eat  Signs/Symptoms: NPO status  Interventions: Boost Breeze, Refer to RD note for recommendations   DVT prophylaxis: On heparin drip for A. fib Code Status: Partial code-NIPPV/intubation/MV only Family Communication: Updated patient's daughter over the phone. Disposition Plan: Remains inpatient.  Still encephalopathic, hemodynamically unstable, hypoglycemic and deconditioned  Consultants: PCCM  Procedures:  ETT 11/6-11/9  Microbiology summarized: 11/6-COVID-19 negative 11/6-MRSA PCR negative 11/5-blood cultures negative so far 11/5-urine cultures negative so far   Sch Meds:  Scheduled Meds: . buPROPion  300 mg Oral Daily  . Chlorhexidine Gluconate Cloth  6 each Topical Daily  . digoxin  0.25 mg Oral Daily  . diltiazem  30 mg Oral Q6H  . feeding supplement  1  Container Oral TID BM  . folic acid  1 mg Oral Daily  . gabapentin  100 mg Oral TID  . Gerhardt's butt cream   Topical TID  . insulin aspart  2-6 Units Subcutaneous Q4H  . mouth rinse  15 mL Mouth Rinse BID  . mycophenolate  500 mg Oral BID  . nystatin cream   Topical BID  . pantoprazole  40 mg Oral Daily  . sodium chloride flush  10-40 mL Intracatheter Q12H  . tacrolimus  1 mg Oral QPM  . tacrolimus  2 mg Oral q morning - 10a  . thiamine   100 mg Oral Daily  . traZODone  150 mg Oral QHS   Continuous Infusions: . heparin 1,650 Units/hr (07/19/19 0943)  . magnesium sulfate bolus IVPB 2 g (07/19/19 1051)   PRN Meds:.acetaminophen, [DISCONTINUED] ondansetron **OR** ondansetron (ZOFRAN) IV, sodium chloride flush  Antimicrobials: Anti-infectives (From admission, onward)   Start     Dose/Rate Route Frequency Ordered Stop   07/14/19 2200  vancomycin (VANCOCIN) 1,500 mg in sodium chloride 0.9 % 500 mL IVPB  Status:  Discontinued     1,500 mg 250 mL/hr over 120 Minutes Intravenous Every 48 hours 07/12/19 2229 07/13/19 1325   07/14/19 2200  vancomycin (VANCOCIN) 1,500 mg in sodium chloride 0.9 % 500 mL IVPB  Status:  Discontinued     1,500 mg 250 mL/hr over 120 Minutes Intravenous Every 48 hours 07/14/19 0915 07/14/19 1151   07/14/19 1130  aztreonam (AZACTAM) 2 g in sodium chloride 0.9 % 100 mL IVPB  Status:  Discontinued     2 g 200 mL/hr over 30 Minutes Intravenous Every 8 hours 07/13/19 1504 07/17/19 0812   07/13/19 1100  levofloxacin (LEVAQUIN) IVPB 750 mg  Status:  Discontinued     750 mg 100 mL/hr over 90 Minutes Intravenous Every 24 hours 07/13/19 1053 07/13/19 1055   07/13/19 1100  levofloxacin (LEVAQUIN) IVPB 500 mg  Status:  Discontinued     500 mg 100 mL/hr over 60 Minutes Intravenous Every 24 hours 07/13/19 1055 07/13/19 1319   07/12/19 2200  vancomycin (VANCOCIN) 1,500 mg in sodium chloride 0.9 % 500 mL IVPB     1,500 mg 250 mL/hr over 120 Minutes Intravenous  Once 07/12/19 2134 07/13/19 0224   07/12/19 2145  levofloxacin (LEVAQUIN) IVPB 750 mg  Status:  Discontinued     750 mg 100 mL/hr over 90 Minutes Intravenous  Once 07/12/19 2134 07/13/19 0900       I have personally reviewed the following labs and images: CBC: Recent Labs  Lab 07/12/19 2013  07/14/19 0200  07/15/19 0400 07/16/19 0500 07/17/19 0800 07/18/19 0425 07/19/19 0439  WBC 13.4*   < > 14.4*   < > 9.8 10.9* 9.0 11.2* 7.9  NEUTROABS 11.8*   --  13.0*  --   --   --   --   --   --   HGB 18.1*   < > 13.1   < > 10.8* 10.6* 9.8* 9.9* 8.8*  HCT 52.9*   < > 38.8   < > 33.1* 32.5* 30.4* 30.8* 27.6*  MCV 102.5*   < > 106.0*   < > 106.4* 106.2* 106.7* 107.3* 109.1*  PLT 185   < > 157   < > 139* 113* 89* 110* 115*   < > = values in this interval not displayed.   BMP &GFR Recent Labs  Lab 07/14/19 1700  07/15/19 0400 07/15/19 1700 07/16/19 0500 07/16/19 2148  07/17/19 0355 07/18/19 0425 07/18/19 0917 07/19/19 0439  NA  --    < >  --   --  145 143 141  --  141 138  K  --    < >  --   --  3.5 3.8 3.6  --  4.2 4.8  CL  --    < >  --   --  122* 118* 116*  --  117* 111  CO2  --    < >  --   --  19* 19* 19*  --  21* 23  GLUCOSE  --    < >  --   --  159* 132* 148*  --  131* 87  BUN  --    < >  --   --  34* 30* 28*  --  18 15  CREATININE  --    < >  --   --  0.75 0.70 0.60  --  0.46 0.47  CALCIUM  --    < >  --   --  8.9 8.7* 8.7*  --  8.3* 8.5*  MG 1.7  --  1.8 1.9  --  1.6* 2.1  --   --  1.6*  PHOS 2.6  --  1.9*  --  2.0*  --  2.4* 2.0*  --   --    < > = values in this interval not displayed.   Estimated Creatinine Clearance: 72.5 mL/min (by C-G formula based on SCr of 0.47 mg/dL). Liver & Pancreas: Recent Labs  Lab 07/14/19 0200 07/14/19 1130 07/16/19 0500 07/18/19 0917 07/19/19 0439  AST 42* 33 18 13* 18  ALT 31 25 21 20 22   ALKPHOS 53 46 48 50 47  BILITOT 1.4* 1.5* 0.9 1.1 0.9  PROT 4.5* 4.1* 4.5* 4.3* 4.1*  ALBUMIN 2.3* 2.0* 2.0* 1.8* 1.8*   No results for input(s): LIPASE, AMYLASE in the last 168 hours. No results for input(s): AMMONIA in the last 168 hours. Diabetic: No results for input(s): HGBA1C in the last 72 hours. Recent Labs  Lab 07/19/19 0317 07/19/19 0828 07/19/19 0829 07/19/19 0942 07/19/19 1047  GLUCAP 84 48* 67* 120* 92   Cardiac Enzymes: Recent Labs  Lab 07/12/19 2242  CKTOTAL 555*   No results for input(s): PROBNP in the last 8760 hours. Coagulation Profile: Recent Labs  Lab 07/12/19  2242 07/15/19 0400  INR 1.3* 1.6*   Thyroid Function Tests: Recent Labs    07/19/19 0730  TSH 3.123  FREET4 0.79   Lipid Profile: No results for input(s): CHOL, HDL, LDLCALC, TRIG, CHOLHDL, LDLDIRECT in the last 72 hours. Anemia Panel: No results for input(s): VITAMINB12, FOLATE, FERRITIN, TIBC, IRON, RETICCTPCT in the last 72 hours. Urine analysis:    Component Value Date/Time   COLORURINE AMBER (A) 07/12/2019 2200   APPEARANCEUR TURBID (A) 07/12/2019 2200   LABSPEC 1.025 07/12/2019 2200   PHURINE 5.0 07/12/2019 2200   GLUCOSEU 50 (A) 07/12/2019 2200   HGBUR NEGATIVE 07/12/2019 2200   BILIRUBINUR SMALL (A) 07/12/2019 2200   KETONESUR NEGATIVE 07/12/2019 2200   PROTEINUR 30 (A) 07/12/2019 2200   NITRITE NEGATIVE 07/12/2019 2200   LEUKOCYTESUR NEGATIVE 07/12/2019 2200   Sepsis Labs: Invalid input(s): PROCALCITONIN, LACTICIDVEN  Microbiology: Recent Results (from the past 240 hour(s))  Urine culture     Status: None   Collection Time: 07/12/19 10:00 PM   Specimen: Urine, Catheterized  Result Value Ref Range Status   Specimen Description URINE, CATHETERIZED  Final  Special Requests NONE  Final   Culture   Final    NO GROWTH Performed at [facility], [address] [place], [place] [address]    Report Status 07/14/2019 FINAL  Final  Blood culture (routine x 2)     Status: None   Collection Time: 07/12/19 10:03 PM   Specimen: BLOOD RIGHT FOREARM  Result Value Ref Range Status   Specimen Description BLOOD RIGHT FOREARM  Final   Special Requests   Final    BOTTLES DRAWN AEROBIC ONLY Blood Culture adequate volume   Culture   Final    NO GROWTH 5 DAYS Performed at [facility], [address], [place], [place] [address]    Report Status 07/17/2019 FINAL  Final  SARS CORONAVIRUS 2 (TAT 6-24 HRS) Nasopharyngeal Nasopharyngeal Swab     Status: None   Collection Time: 07/13/19 12:48 AM   Specimen: Nasopharyngeal Swab  Result Value Ref Range Status    SARS Coronavirus 2 NEGATIVE NEGATIVE Final    Comment: (NOTE) SARS-CoV-2 target nucleic acids are NOT DETECTED. The SARS-CoV-2 RNA is generally detectable in upper and lower respiratory specimens during the acute phase of infection. Negative results do not preclude SARS-CoV-2 infection, do not rule out co-infections with other pathogens, and should not be used as the sole basis for treatment or other patient management decisions. Negative results must be combined with clinical observations, patient history, and epidemiological information. The expected result is Negative. Fact Sheet for Patients: HairSlick.no Fact Sheet for Healthcare Providers: quierodirigir.com This test is not yet approved or cleared by the [place] FDA and  has been authorized for detection and/or diagnosis of SARS-CoV-2 by FDA under an Emergency Use Authorization (EUA). This EUA will remain  in effect (meaning this test can be used) for the duration of the COVID-19 declaration under Section 56 4(b)(1) of the Act, 21 U.S.C. section 360bbb-3(b)(1), unless the authorization is terminated or revoked sooner. Performed at [facility], [address], [place], [place] [address]   MRSA PCR Screening     Status: None   Collection Time: 07/13/19  8:57 PM   Specimen: Nasopharyngeal  Result Value Ref Range Status   MRSA by PCR NEGATIVE NEGATIVE Final    Comment:        The GeneXpert MRSA Assay (FDA approved for NASAL specimens only), is one component of a comprehensive MRSA colonization surveillance program. It is not intended to diagnose MRSA infection nor to guide or monitor treatment for MRSA infections. Performed at [facility], [address] [place], [place] [address]     Radiology Studies: No results found.  55 minutes with more than 50% spent in reviewing records, counseling patient/family and coordinating care.   Shavonda Wiedman T. Raeana Blinn [facility]  If 7PM-7AM, please contact night-coverage [email or web address] Password [email or web address] 07/19/2019, 11:22 AM

## 2019-07-19 NOTE — Progress Notes (Signed)
Brief Pharmacy Consult Note - IV heparin for Afib  Labs: heparin level 0.38  A/P: heparin level therapeutic (goal 0.3-0.7) for Afib on current IV heparin rate of 1600 units/hr. No reported bleeding. Continue current IV heparin rate.  Dorrene German 07/19/2019 2:41 AM

## 2019-07-19 NOTE — Progress Notes (Signed)
[facility] Progress Note Patient Name: Anna [name] DOB: 1950-12-04 MRN: [id number]   Date of Service  07/19/2019  HPI/Events of Note  Hypotension after receiving 50 mg of Atenolol, Pt is an alcoholic with serum albumin of 1.8.  eICU Interventions  Albumin 5 % 25 gm iv bolus, Atenolol discontinued.        Kerry Kass Ogan 07/19/2019, 3:23 AM

## 2019-07-19 NOTE — Progress Notes (Signed)
ANTICOAGULATION CONSULT NOTE - Follow Up Consult  Pharmacy Consult for heparin Indication: atrial fibrillation with RVR  Allergies  Allergen Reactions  . Iodine-131 Hives and Shortness Of Breath  . 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 Penicillin G Rash    Has patient had a PCN reaction causing immediate rash, facial/tongue/throat swelling, SOB or lightheadedness with hypotension: Yes Has patient had a PCN reaction causing severe rash involving mucus membranes or skin necrosis: Unk Has patient had a PCN reaction that required hospitalization: Unk Has patient had a PCN reaction occurring within the last 10 years: No If all of the above answers are "NO", then may proceed with Cephalosporin use.     Patient Measurements: Height: 5\' 4"  (162.6 cm) Weight: 195 lb 1.7 oz (88.5 kg) IBW/kg (Calculated) : 54.7 Heparin Dosing Weight: 72 kg  Vital Signs: Temp: 97.9 F (36.6 C) (11/12 0800) Temp Source: Oral (11/12 0800) BP: 93/47 (11/12 0855) Pulse Rate: 76 (11/12 0600)  Labs: Recent Labs    07/17/19 0355  07/17/19 0800  07/18/19 0425 07/18/19 0917  07/18/19 1810 07/19/19 0015 07/19/19 0439  HGB  --    < > 9.8*  --  9.9*  --   --   --   --  8.8*  HCT  --   --  30.4*  --  30.8*  --   --   --   --  27.6*  PLT  --   --  89*  --  110*  --   --   --   --  115*  HEPARINUNFRC  --   --   --    < > 0.24*  --    < > 0.40 0.38 0.32  CREATININE 0.60  --   --   --   --  0.46  --   --   --  0.47   < > = values in this interval not displayed.    Estimated Creatinine Clearance: 72.5 mL/min (by C-G formula based on SCr of 0.47 mg/dL).   Assessment: Patient's a 68 y.o F with hx renal transplant presented to the ED on 11/5 after she was found unresponsive by EMS.  Head CT showed no acute findings.  She was subsequently intubated and started on heparin drip for Afib with RVR.  Today, 07/19/2019: - heparin level is therapeutic but is at lower end of range  with 0.32 - hgb down slightly to 8.8 - plts low but increasing today 115 - no bleeding documented  Goal of Therapy:  Heparin level 0.3-0.7 units/ml Monitor platelets by anticoagulation protocol: Yes   Plan:  - increase heparin drip slightly to 1650 units/hr - daily heparin level & CBC - monitor for s/s bleeding  Lynelle Doctor, PharmD, BCPS 07/19/2019,9:08 AM

## 2019-07-19 NOTE — Evaluation (Signed)
Occupational Therapy Evaluation Patient Details Name: Anna [name] MRN: [id number] DOB: 24-Nov-1950 Today's Date: 07/19/2019    History of Present Illness Pt is51 y.o. female, with history of HTN, asthma, and polysubstance abuse.  Pt was found minmally responsive in her home with pills and empty alcohol bottles around her.  She admitted with metabolic encephalopathy, polysubstance abuse, hypothermia.  Pt was intubated on 07/13/19 and extubated 07/16/19. Has had afib with RVR.   Clinical Impression   Pt was admitted for the above.  At baseline, she was performing adls per chart review. Pt has generalize weakness throughout and while she can move arms, she cannot successfully perform ADLs without additional positioning, and this is very limited. She needs mostly total to total +2 assist for adls at this time. Will follow in acute setting focusing on the intial goals below    Follow Up Recommendations  SNF    Equipment Recommendations  (tba)    Recommendations for Other Services       Precautions / Restrictions Precautions Precautions: Fall Precaution Comments: triple lumen Restrictions Weight Bearing Restrictions: No      Mobility Bed Mobility          total +2 assist to roll        Transfers                      Balance                                           ADL either performed or assessed with clinical judgement   ADL Overall ADL's : Needs assistance/impaired                                       General ADL Comments: Pt needs total A for all adls, +2 to roll for LB adls.  She was able to wash face with RUE and min A when pillows positioned under R elbow to assist with reach.  Max to total for other ADLs     Vision         Perception     Praxis      Pertinent Vitals/Pain Pain Assessment: No/denies pain     Hand Dominance Right   Extremity/Trunk Assessment Upper Extremity Assessment Upper  Extremity Assessment: RUE deficits/detail;LUE deficits/detail RUE Deficits / Details: dominant:  shoulder 2-/5; elbow 3-/5 hand 3-/5 LUE Deficits / Details: shoulder 2+/5; elbow and hand 3-/5. With edema:  positioned and encouraged AROM       Cervical / Trunk Assessment Cervical / Trunk Assessment: (tends to hold neck to R; triple lumen on that side)   Communication Communication Communication: No difficulties   Cognition Arousal/Alertness: Awake/alert Behavior During Therapy: Flat affect Overall Cognitive Status: Impaired/Different from baseline                         Following Commands: Follows one step commands inconsistently     Problem Solving: Slow processing;Requires verbal cues(at times especially for verbal answers) General Comments: pt with bil UE weakness; followed most commands with arms   General Comments       Exercises     Shoulder Instructions      Home Living Family/patient expects to be discharged to:: Skilled nursing facility  Prior Functioning/Environment          Comments: could perform adls and furniture walked prior to admission        OT Problem List: Decreased strength;Decreased range of motion;Decreased activity tolerance;Decreased cognition;Decreased coordination;Impaired UE functional use;Cardiopulmonary status limiting activity;Impaired balance (sitting and/or standing);Decreased knowledge of use of DME or AE      OT Treatment/Interventions: Self-care/ADL training;Therapeutic exercise;DME and/or AE instruction;Therapeutic activities;Cognitive remediation/compensation;Patient/family education;Balance training    OT Goals(Current goals can be found in the care plan section) Acute Rehab OT Goals Patient Stated Goal: unable to state OT Goal Formulation: Patient unable to participate in goal setting Time For Goal Achievement: 08/02/19 Potential to Achieve Goals: Fair ADL  Goals Pt Will Perform Eating: with min assist;sitting;bed level Pt Will Perform Grooming: with min assist;bed level;sitting Additional ADL Goal #1: pt will perform 10 reps x 3 motions A/AAROM to bil UEs to increase strength for adls Additional ADL Goal #2: pt will roll to bil sides with mod +2 assist in preparation for seated adls Additional ADL Goal #3: pt will follow 100% commands within her abilities within 10 seconds  OT Frequency: Min 2X/week   Barriers to D/C:            Co-evaluation              AM-PAC OT "6 Clicks" Daily Activity     Outcome Measure Help from another person eating meals?: Total Help from another person taking care of personal grooming?: Total Help from another person toileting, which includes using toliet, bedpan, or urinal?: Total Help from another person bathing (including washing, rinsing, drying)?: Total Help from another person to put on and taking off regular upper body clothing?: Total Help from another person to put on and taking off regular lower body clothing?: Total 6 Click Score: 6   End of Session    Activity Tolerance: Patient tolerated treatment well Patient left: in bed;with call bell/phone within reach;with bed alarm set  OT Visit Diagnosis: Muscle weakness (generalized) (M62.81)                Time: 1607-3710 OT Time Calculation (min): 16 min Charges:  OT General Charges $OT Visit: 1 Visit OT Evaluation $OT Eval Moderate Complexity: 1 Mod  Marica Otter, OTR/L Acute Rehabilitation Services [phone] [facility] pager [phone] office 07/19/2019  Anna [name] 07/19/2019, 11:29 AM

## 2019-07-19 NOTE — Progress Notes (Signed)
Hypoglycemic Event  CBG: 48  Treatment: 25g D50  Symptoms: none  Follow-up CBG: Time: 0942 CBG Result: 120  Possible Reasons for Event: Poor PO intake   Comments/MD notified: Cyndia Skeeters MD     [place]

## 2019-07-20 DIAGNOSIS — I455 Other specified heart block: Secondary | ICD-10-CM

## 2019-07-20 LAB — RETICULOCYTES
Immature Retic Fract: 25 % — ABNORMAL HIGH (ref 2.3–15.9)
RBC.: 2.66 MIL/uL — ABNORMAL LOW (ref 3.87–5.11)
Retic Count, Absolute: 191.5 10*3/uL — ABNORMAL HIGH (ref 19.0–186.0)
Retic Ct Pct: 7.4 % — ABNORMAL HIGH (ref 0.4–3.1)

## 2019-07-20 LAB — IRON AND TIBC
Iron: 33 ug/dL (ref 28–170)
Saturation Ratios: 29 % (ref 10.4–31.8)
TIBC: 113 ug/dL — ABNORMAL LOW (ref 250–450)
UIBC: 80 ug/dL

## 2019-07-20 LAB — BASIC METABOLIC PANEL
Anion gap: 5 (ref 5–15)
BUN: 11 mg/dL (ref 8–23)
CO2: 23 mmol/L (ref 22–32)
Calcium: 8.9 mg/dL (ref 8.9–10.3)
Chloride: 111 mmol/L (ref 98–111)
Creatinine, Ser: 0.41 mg/dL — ABNORMAL LOW (ref 0.44–1.00)
GFR calc Af Amer: >60 mL/min (ref >60)
GFR calc non Af Amer: >60 mL/min (ref >60)
Glucose, Bld: 129 mg/dL — ABNORMAL HIGH (ref 70–99)
Potassium: 4.3 mmol/L (ref 3.5–5.1)
Sodium: 139 mmol/L (ref 135–145)

## 2019-07-20 LAB — GLUCOSE, CAPILLARY
Glucose-Capillary: 109 mg/dL — ABNORMAL HIGH (ref 70–99)
Glucose-Capillary: 132 mg/dL — ABNORMAL HIGH (ref 70–99)
Glucose-Capillary: 136 mg/dL — ABNORMAL HIGH (ref 70–99)
Glucose-Capillary: 95 mg/dL (ref 70–99)
Glucose-Capillary: 97 mg/dL (ref 70–99)
Glucose-Capillary: 98 mg/dL (ref 70–99)

## 2019-07-20 LAB — CBC
HCT: 28.5 % — ABNORMAL LOW (ref 36.0–46.0)
Hemoglobin: 9.2 g/dL — ABNORMAL LOW (ref 12.0–15.0)
MCH: 35.4 pg — ABNORMAL HIGH (ref 26.0–34.0)
MCHC: 32.3 g/dL (ref 30.0–36.0)
MCV: 109.6 fL — ABNORMAL HIGH (ref 80.0–100.0)
Platelets: 138 10*3/uL — ABNORMAL LOW (ref 150–400)
RBC: 2.6 MIL/uL — ABNORMAL LOW (ref 3.87–5.11)
RDW: 15.3 % (ref 11.5–15.5)
WBC: 6.1 10*3/uL (ref 4.0–10.5)
nRBC: 0.5 % — ABNORMAL HIGH (ref 0.0–0.2)

## 2019-07-20 LAB — MAGNESIUM: Magnesium: 2 mg/dL (ref 1.7–2.4)

## 2019-07-20 LAB — VITAMIN B12: Vitamin B-12: 814 pg/mL (ref 180–914)

## 2019-07-20 LAB — FOLATE: Folate: 11.6 ng/mL (ref >5.9)

## 2019-07-20 LAB — HEPARIN LEVEL (UNFRACTIONATED): Heparin Unfractionated: 0.5 IU/mL (ref 0.30–0.70)

## 2019-07-20 LAB — FERRITIN: Ferritin: 2766 ng/mL — ABNORMAL HIGH (ref 11–307)

## 2019-07-20 LAB — PHOSPHORUS: Phosphorus: 3.1 mg/dL (ref 2.5–4.6)

## 2019-07-20 MED ORDER — DILTIAZEM HCL ER COATED BEADS 120 MG PO CP24
120.0000 mg | ORAL_CAPSULE | Freq: Every day | ORAL | Status: DC
  Administered 2019-07-20: 120 mg via ORAL
  Filled 2019-07-20: qty 1

## 2019-07-20 MED ORDER — DILTIAZEM HCL 30 MG PO TABS: 30.0000 mg | ORAL_TABLET | Freq: Four times a day (QID) | ORAL | Status: DC | PRN

## 2019-07-20 MED ORDER — SODIUM CHLORIDE 0.9 % IV BOLUS
500.0000 mL | Freq: Once | INTRAVENOUS | Status: AC
  Administered 2019-07-20: 500 mL via INTRAVENOUS

## 2019-07-20 NOTE — Progress Notes (Signed)
Physical Therapy Treatment Patient Details Name: Anna [name] MRN: [id number] DOB: 1950/10/29 Today's Date: 07/20/2019    History of Present Illness Pt is76 y.o. female, with history of HTN, asthma, and polysubstance abuse.  Pt was found minmally responsive in her home with pills and empty alcohol bottles around her.  She admitted with metabolic encephalopathy, polysubstance abuse, hypothermia.  Pt was intubated on 07/13/19 and extubated 07/16/19. Has had afib with RVR.    PT Comments    Pt in bed briefly engaged.  AxO x 1.  Severe R head tilt.  General bed mobility comments: pt required Total Assist to transfer to EOB.  Unable to support self, required Total Assist to prevent LOB all planes.  Pt unable to hiold head in midline.  Max forward flex, collapsed posture and severe R head tilt )  General transfer comment: unable to attempt sit to stand even with + 3 assist.  started to set up to use Shy Lift by placing pad under pt as she sat OOB then discovered Shy Lift was not charged.  RN called into room.  Pt returned to supine and positioned to comfort. Rec Lift for OOB.   Follow Up Recommendations  SNF;LTACH     Equipment Recommendations  None recommended by PT    Recommendations for Other Services       Precautions / Restrictions Precautions Precautions: Fall Precaution Comments: triple lumen PICC line Restrictions Weight Bearing Restrictions: No    Mobility  Bed Mobility Overal bed mobility: Needs Assistance Bed Mobility: Rolling;Sidelying to Sit;Sit to Sidelying Rolling: +2 for physical assistance;+2 for safety/equipment;Total assist(pt 0%) Sidelying to sit: Total assist;+2 for physical assistance;+2 for safety/equipment(pt 0%)       General bed mobility comments: pt required Total Assist to transfer to EOB.  Unable to support self, required Total Assist to prevent LOB all planes.  Pt unable to hiold head in midline.  Max forward flex, collapsed posture and severe R  head tilt )  Transfers                 General transfer comment: unable to attempt sit to stand even with + 3 assist.  started to set up to use Shy Lift by placing pad under pt as she sat OOB then discovered Shy Lift was not charged.  RN called into room.  Pt returned to supine and positioned to comfort.  Ambulation/Gait                 Stairs             Wheelchair Mobility    Modified Rankin (Stroke Patients Only)       Balance                                            Cognition Arousal/Alertness: Awake/alert Behavior During Therapy: Flat affect Overall Cognitive Status: Impaired/Different from baseline                                 General Comments: AxO x 1 groggy, delayed      Exercises      General Comments        Pertinent Vitals/Pain Pain Assessment: Faces Faces Pain Scale: Hurts little more Pain Location: general Pain Descriptors / Indicators: Discomfort;Grimacing Pain Intervention(s): Monitored during session;Repositioned  Home Living                      Prior Function            PT Goals (current goals can now be found in the care plan section) Progress towards PT goals: Not progressing toward goals - comment    Frequency    Min 2X/week      PT Plan      Co-evaluation              AM-PAC PT "6 Clicks" Mobility   Outcome Measure  Help needed turning from your back to your side while in a flat bed without using bedrails?: Total Help needed moving from lying on your back to sitting on the side of a flat bed without using bedrails?: Total Help needed moving to and from a bed to a chair (including a wheelchair)?: Total Help needed standing up from a chair using your arms (e.g., wheelchair or bedside chair)?: Total Help needed to walk in hospital room?: Total Help needed climbing 3-5 steps with a railing? : Total 6 Click Score: 6    End of Session   Activity  Tolerance: Patient limited by lethargy;Other (comment);Patient limited by fatigue;Treatment limited secondary to medical complications (Comment) Patient left: in bed;with bed alarm set;with call bell/phone within reach;with SCD's reapplied Nurse Communication: Mobility status;Need for lift equipment PT Visit Diagnosis: Muscle weakness (generalized) (M62.81);Difficulty in walking, not elsewhere classified (R26.2)     Time: 5053-9767 PT Time Calculation (min) (ACUTE ONLY): 24 min  Charges:  $Therapeutic Activity: 23-37 mins                     Rica Koyanagi  PTA Acute  Rehabilitation Services Pager      [phone] Office      [phone]

## 2019-07-20 NOTE — Progress Notes (Signed)
Pt continues to have frequent sinus pauses on the monitor despite discontinuing digoxin earlier. Dr. Cyndia Skeeters notified, awaiting further orders. RN will continue to assess and monitor pt.

## 2019-07-20 NOTE — Progress Notes (Addendum)
RN noticed pt is having frequent sinus pauses on monitor. Elink confirmed longest pause lasted 3.5 secs. Dr. Cyndia Skeeters paged by RN. Orders placed to discontinue digoxin; RN will continue to monitor.

## 2019-07-20 NOTE — Progress Notes (Signed)
  Speech Language Pathology Treatment: Dysphagia  Patient Details Name: Anna [name] MRN: [id number] DOB: 04-08-1951 Today's Date: 07/20/2019 Time: 8841-6606 SLP Time Calculation (min) (ACUTE ONLY): 27 min  Assessment / Plan / Recommendation Clinical Impression  RN reports patient has been tolerating clear liquid diet. She is much more alert today. Answering questions and requiring only minimal verbal cues. PO trials of water, pudding and cracker tolerated well with no s/sx of aspiration. Prolonged mastication and bolus formation with complete oral clearance. Due to pt's continued weakness, recommend Dys 2 (chopped) diet with thin liquids. ST will follow for diet tolerance and upgrade.    HPI HPI: pt is a 68 yo female adm to [facility] after being found down at home with wine bottles around her.  She reported to MD that she drinks 6 1.5 liters of wine a week.  She requiring intubation  11/6-11/02/2019.  Pt head CT negative, CXR stable.  Pt has an NG in place.  H/O renal transplant, ETOH use, HTN, asthma.  WBC 11.2 and HR has been elevated at times.  Pt also with h/o admit with hematemesis in 10/2017.      SLP Plan  Continue with current plan of care       Recommendations  Diet recommendations: Dysphagia 2 (fine chop);Thin liquid Liquids provided via: Cup;Straw Medication Administration: Whole meds with puree Supervision: Staff to assist with self feeding Compensations: Slow rate;Small sips/bites Postural Changes and/or Swallow Maneuvers: Seated upright 90 degrees                Oral Care Recommendations: Oral care BID Follow up Recommendations: Skilled Nursing facility SLP Visit Diagnosis: Dysphagia, oral phase (R13.11) Plan: Continue with current plan of care       [place], [place], CCC-SLP 07/20/2019, 10:05 AM

## 2019-07-20 NOTE — Progress Notes (Signed)
Pt had foley removed on 11/12 at 1700.  RN noticed pt has had no urine output since foley removal.  RN performed bladder scan that resulted in 175 mL in bladder.  NP paged.  Awaiting orders.

## 2019-07-20 NOTE — Progress Notes (Signed)
PROGRESS NOTE  Shaina Gullatt DQQ:[id number] DOB: [birth year]   PCP: Patient, No Pcp Per  Patient is from: Home.  Per patient's daughter, lives alone in an apartment, independent for most ADL S, ambulates by holding to furniture's and fairly oriented.  Does not have walker or cane.  DOA: 07/12/2019 LOS: 8  Brief Narrative / Interim history: 68 year old female with history of asthma, HTN, renal transplant on immuno modulators and alcohol use presented 07/12/2019 after found down and minimally responsive at home.  It was noted that she had Tylenol pills and empty alcohol bottles around her when EMS found her.  She was hypoxic to 60s and hypotensive with SBP to 80s per EMS.  In ED, head CT and cervical spine were negative.  EtOH, Tylenol, ASA levels are normal.  She was noted to be in A. fib with RVR in low 100s.  She was also encephalopathic.  PCCM consulted 11/6 and patient was intubated.   In ICU, patient was treated for acute encephalopathy thought to be due to alcohol withdrawal, respiratory failure with hypercarbia and hypoxia and A. fib with RVR.  Infectious work-up including blood culture, urine culture, sputum culture and COVID-19 negative. Patient received empiric antibiotics from 11/5-11/10. Echo on 11/6 with EF of 55 to 60%, indeterminate DD and RVSP to 46 mmHg.  She was extubated to nasal cannula 11/9.  TRH assumed care 07/19/2019.  Subjective: No major events overnight of this morning.  Patient had some pauses on telemetry after 3.5 seconds.  No complaints.  She is oriented to self, partial place, month, year and family name.  She denies chest pain, dyspnea, palpitation, nausea, vomiting or abdominal pain.   Objective: Vitals:   07/20/19 0600 07/20/19 0800 07/20/19 0900 07/20/19 1000  BP: 112/64 (!) 110/55 122/73 105/70  Pulse: 66 64 76 76  Resp: (!) 22 18 19  (!) 23  Temp:  (!) 96.7 F (35.9 C)    TempSrc:  Axillary    SpO2: 100% 100% 98% 99%  Weight:      Height:         Intake/Output Summary (Last 24 hours) at 07/20/2019 1219 Last data filed at 07/20/2019 1000 Gross per 24 hour  Intake 260.16 ml  Output 525 ml  Net -264.84 ml   Filed Weights   07/18/19 0436 07/19/19 0439 07/20/19 0432  Weight: 87.8 kg 88.5 kg 88.6 kg    Examination:  GENERAL: No acute distress.  Lying in bed comfortably. HEENT: MMM.  Vision and hearing grossly intact.  NECK: Supple.  No apparent JVD.  Right CVL. RESP:  No IWOB. Good air movement bilaterally. CVS: Irregular rhythm.  Normal rate. Heart sounds normal.  ABD/GI/GU: Bowel sounds present. Soft. Non tender.  MSK/EXT:  Moves extremities. No apparent deformity or edema.  SKIN: no apparent skin lesion or wound NEURO: Awake, alert and oriented to self, partial place, month and year.  No apparent focal neuro deficit. PSYCH: Calm. Normal affect.  Access: Right IJ CVL  Assessment & Plan: Acute metabolic encephalopathy secondary to alcohol intoxication/withdrawal and possible polypharmacy: history of abusing Zyquil and opiates per daughter.  UDS negative.  Thyroid panel normal.  CT head without acute finding.  Should be outside withdrawal window now.  No obvious source of infection but received empiric antibiotics 11/6-11/10.  Encephalopathy improving. -Continue supportive care, multivitamins and thiamine's. -Treat treatable causes -Frequent reorientation and delirium precautions  A. fib with RVR: RVR resolved.  CHA2DS2-VASc score> 3.  Echo 07/12/2019 with EF of 55 to 60%  and moderate PHTN to 46 mmHg Pauses on telemetry -Discontinued atenolol in the setting of hypotension and history of asthma,and started Cardizem 11/12 -Discontinue digoxin given frequent pauses on telemetry-we may have to hold Cardizem if she continues to have pauses. -Closely monitor electrolytes and replenish aggressively -Change heparin to to NOAC after Hemoccult  Hypotension: Normotensive this morning. -Discontinue digoxin.  Amlodipine and atenolol has  already been discontinued.  Moderate pulmonary hypertension: Echo revealed RVSP to 46 mmHg. -Could benefit from Cardizem as above.  History of renal transplant on CellCept and Prograf: Renal function is stable.  Fair urine output. -Continue home CellCept and Prograf  Acute respiratory failure with hypoxia and hypercarbia: Likely due to encephalopathy and alcohol intoxication.  Has history of asthma but only takes as needed albuterol. -ETT 11/6-11/9 -Wean oxygen as able-currently saturating 100% on 2 L. -Continue as needed breathing treatments -Aspiration precaution  Post intubation dysphagia -Appreciate guidance by SLP and dietitian.  Hypoglycemia: Resolved.  Likely due to poor p.o. intake.  Cortisol level within normal range. -Continue monitoring  Anemia of chronic disease: Baseline 12-13.  Anemia panel consistent with anemia of chronic disease.  No obvious signs of bleeding. -Hgb 18.1 (admit)>11.2 (next day)>>> 8.8>9.2 -Continue monitoring.  Thrombocytopenia: Likely due to alcohol and acute illness.  Improving -Continue monitoring  History of mood disorder -Continue home Wellbutrin -Reduce on trazodone to 100 mg daily.  Deconditioning/debility/generalized weakness: Far from baseline.  See above. -PT/OT-recommended SNF.  Pressure Injury 07/12/19 Buttocks Right Deep Tissue Injury - Purple or maroon localized area of discolored intact skin or blood-filled blister due to damage of underlying soft tissue from pressure and/or shear. Blister with three stripes and purple disco (Active)  07/12/19   Location: Buttocks  Location Orientation: Right  Staging: Deep Tissue Injury - Purple or maroon localized area of discolored intact skin or blood-filled blister due to damage of underlying soft tissue from pressure and/or shear.  Wound Description (Comments): Blister with three stripes and purple discoloration  Present on Admission: Yes     Pressure Injury 07/12/19 Ischial tuberosity  Lateral;Right Deep Tissue Injury - Purple or maroon localized area of discolored intact skin or blood-filled blister due to damage of underlying soft tissue from pressure and/or shear. open blister with purple  (Active)  07/12/19   Location: Ischial tuberosity  Location Orientation: Lateral;Right  Staging: Deep Tissue Injury - Purple or maroon localized area of discolored intact skin or blood-filled blister due to damage of underlying soft tissue from pressure and/or shear.  Wound Description (Comments): open blister with purple discolaration.  Present on Admission: Yes     Nutrition Problem: Inadequate oral intake Etiology: inability to eat  Signs/Symptoms: NPO status  Interventions: Boost Breeze, Refer to RD note for recommendations   DVT prophylaxis: On heparin drip for A. fib Code Status: Partial code-NIPPV/intubation/MV only Family Communication: Updated patient's daughter over the phone 11/12. Disposition Plan: Remains inpatient due to ongoing encephalopathy, deconditioning and arrhythmia/pauses.  Consultants: PCCM  Procedures:  ETT 11/6-11/9  Microbiology summarized: 11/6-COVID-19 negative 11/6-MRSA PCR negative 11/5-blood cultures negative so far 11/5-urine cultures negative so far   Sch Meds:  Scheduled Meds: . buPROPion  300 mg Oral Daily  . Chlorhexidine Gluconate Cloth  6 each Topical Daily  . diltiazem  120 mg Oral Daily  . feeding supplement  1 Container Oral TID BM  . folic acid  1 mg Oral Daily  . gabapentin  100 mg Oral TID  . Gerhardt's butt cream   Topical TID  .  insulin aspart  2-6 Units Subcutaneous Q4H  . mouth rinse  15 mL Mouth Rinse BID  . mycophenolate  500 mg Oral BID  . nystatin cream   Topical BID  . pantoprazole  40 mg Oral Daily  . sodium chloride flush  10-40 mL Intracatheter Q12H  . tacrolimus  1 mg Oral QPM  . tacrolimus  2 mg Oral q morning - 10a  . thiamine  100 mg Oral Daily  . traZODone  100 mg Oral QHS   Continuous  Infusions: . heparin 1,650 Units/hr (07/20/19 1000)   PRN Meds:.acetaminophen, [DISCONTINUED] ondansetron **OR** ondansetron (ZOFRAN) IV, sodium chloride flush  Antimicrobials: Anti-infectives (From admission, onward)   Start     Dose/Rate Route Frequency Ordered Stop   07/14/19 2200  vancomycin (VANCOCIN) 1,500 mg in sodium chloride 0.9 % 500 mL IVPB  Status:  Discontinued     1,500 mg 250 mL/hr over 120 Minutes Intravenous Every 48 hours 07/12/19 2229 07/13/19 1325   07/14/19 2200  vancomycin (VANCOCIN) 1,500 mg in sodium chloride 0.9 % 500 mL IVPB  Status:  Discontinued     1,500 mg 250 mL/hr over 120 Minutes Intravenous Every 48 hours 07/14/19 0915 07/14/19 1151   07/14/19 1130  aztreonam (AZACTAM) 2 g in sodium chloride 0.9 % 100 mL IVPB  Status:  Discontinued     2 g 200 mL/hr over 30 Minutes Intravenous Every 8 hours 07/13/19 1504 07/17/19 0812   07/13/19 1100  levofloxacin (LEVAQUIN) IVPB 750 mg  Status:  Discontinued     750 mg 100 mL/hr over 90 Minutes Intravenous Every 24 hours 07/13/19 1053 07/13/19 1055   07/13/19 1100  levofloxacin (LEVAQUIN) IVPB 500 mg  Status:  Discontinued     500 mg 100 mL/hr over 60 Minutes Intravenous Every 24 hours 07/13/19 1055 07/13/19 1319   07/12/19 2200  vancomycin (VANCOCIN) 1,500 mg in sodium chloride 0.9 % 500 mL IVPB     1,500 mg 250 mL/hr over 120 Minutes Intravenous  Once 07/12/19 2134 07/13/19 0224   07/12/19 2145  levofloxacin (LEVAQUIN) IVPB 750 mg  Status:  Discontinued     750 mg 100 mL/hr over 90 Minutes Intravenous  Once 07/12/19 2134 07/13/19 0900       I have personally reviewed the following labs and images: CBC: Recent Labs  Lab 07/14/19 0200  07/16/19 0500 07/17/19 0800 07/18/19 0425 07/19/19 0439 07/20/19 0431  WBC 14.4*   < > 10.9* 9.0 11.2* 7.9 6.1  NEUTROABS 13.0*  --   --   --   --   --   --   HGB 13.1   < > 10.6* 9.8* 9.9* 8.8* 9.2*  HCT 38.8   < > 32.5* 30.4* 30.8* 27.6* 28.5*  MCV 106.0*   < > 106.2*  106.7* 107.3* 109.1* 109.6*  PLT 157   < > 113* 89* 110* 115* 138*   < > = values in this interval not displayed.   BMP &GFR Recent Labs  Lab 07/15/19 0400 07/15/19 1700 07/16/19 0500 07/16/19 2148 07/17/19 0355 07/18/19 0425 07/18/19 0917 07/19/19 0439 07/20/19 0431  NA  --   --  145 143 141  --  141 138 139  K  --   --  3.5 3.8 3.6  --  4.2 4.8 4.3  CL  --   --  122* 118* 116*  --  117* 111 111  CO2  --   --  19* 19* 19*  --  21* 23  23  GLUCOSE  --   --  159* 132* 148*  --  131* 87 129*  BUN  --   --  34* 30* 28*  --  CREATININE  --   --  0.75 0.70 0.60  --  0.46 0.47 0.41*  CALCIUM  --   --  8.9 8.7* 8.7*  --  8.3* 8.5* 8.9  MG 1.8 1.9  --  1.6* 2.1  --   --  1.6* 2.0  PHOS 1.9*  --  2.0*  --  2.4* 2.0*  --   --  3.1   Estimated Creatinine Clearance: 72.6 mL/min (A) (by C-G formula based on SCr of 0.41 mg/dL (L)). Liver & Pancreas: Recent Labs  Lab 07/14/19 0200 07/14/19 1130 07/16/19 0500 07/18/19 0917 07/19/19 0439  AST 42* 33 18 13* 18  ALT ALKPHOS 53 46 48 50 47  BILITOT 1.4* 1.5* 0.9 1.1 0.9  PROT 4.5* 4.1* 4.5* 4.3* 4.1*  ALBUMIN 2.3* 2.0* 2.0* 1.8* 1.8*   No results for input(s): LIPASE, AMYLASE in the last 168 hours. No results for input(s): AMMONIA in the last 168 hours. Diabetic: No results for input(s): HGBA1C in the last 72 hours. Recent Labs  Lab 07/19/19 2020 07/19/19 2342 07/20/19 0340 07/20/19 0733 07/20/19 1137  GLUCAP 103* 141* 132* 95 136*   Cardiac Enzymes: No results for input(s): CKTOTAL, CKMB, CKMBINDEX, TROPONINI in the last 168 hours. No results for input(s): PROBNP in the last 8760 hours. Coagulation Profile: Recent Labs  Lab 07/15/19 0400  INR 1.6*   Thyroid Function Tests: Recent Labs    07/19/19 0730  TSH 3.123  FREET4 0.79   Lipid Profile: No results for input(s): CHOL, HDL, LDLCALC, TRIG, CHOLHDL, LDLDIRECT in the last 72 hours. Anemia Panel: Recent Labs    07/20/19 0758   VITAMINB12 814  FOLATE 11.6  FERRITIN 2,766*  TIBC 113*  IRON 33  RETICCTPCT 7.4*   Urine analysis:    Component Value Date/Time   COLORURINE AMBER (A) 07/12/2019 2200   APPEARANCEUR TURBID (A) 07/12/2019 2200   LABSPEC 1.025 07/12/2019 2200   PHURINE 5.0 07/12/2019 2200   GLUCOSEU 50 (A) 07/12/2019 2200   HGBUR NEGATIVE 07/12/2019 2200   BILIRUBINUR SMALL (A) 07/12/2019 2200   KETONESUR NEGATIVE 07/12/2019 2200   PROTEINUR 30 (A) 07/12/2019 2200   NITRITE NEGATIVE 07/12/2019 2200   LEUKOCYTESUR NEGATIVE 07/12/2019 2200   Sepsis Labs: Invalid input(s): PROCALCITONIN, LACTICIDVEN  Microbiology: Recent Results (from the past 240 hour(s))  Urine culture     Status: None   Collection Time: 07/12/19 10:00 PM   Specimen: Urine, Catheterized  Result Value Ref Range Status   Specimen Description URINE, CATHETERIZED  Final   Special Requests NONE  Final   Culture   Final    NO GROWTH Performed at [facility], [address] [place], [place] [address]    Report Status 07/14/2019 FINAL  Final  Blood culture (routine x 2)     Status: None   Collection Time: 07/12/19 10:03 PM   Specimen: BLOOD RIGHT FOREARM  Result Value Ref Range Status   Specimen Description BLOOD RIGHT FOREARM  Final   Special Requests   Final    BOTTLES DRAWN AEROBIC ONLY Blood Culture adequate volume   Culture   Final    NO GROWTH 5 DAYS Performed at [facility], [address] [place], [place] [address]  Report Status 07/17/2019 FINAL  Final  SARS CORONAVIRUS 2 (TAT 6-24 HRS) Nasopharyngeal Nasopharyngeal Swab     Status: None   Collection Time: 07/13/19 12:48 AM   Specimen: Nasopharyngeal Swab  Result Value Ref Range Status   SARS Coronavirus 2 NEGATIVE NEGATIVE Final    Comment: (NOTE) SARS-CoV-2 target nucleic acids are NOT DETECTED. The SARS-CoV-2 RNA is generally detectable in upper and lower respiratory specimens during the acute phase of infection. Negative results do not  preclude SARS-CoV-2 infection, do not rule out co-infections with other pathogens, and should not be used as the sole basis for treatment or other patient management decisions. Negative results must be combined with clinical observations, patient history, and epidemiological information. The expected result is Negative. Fact Sheet for Patients: HairSlick.no Fact Sheet for Healthcare Providers: quierodirigir.com This test is not yet approved or cleared by the [place] FDA and  has been authorized for detection and/or diagnosis of SARS-CoV-2 by FDA under an Emergency Use Authorization (EUA). This EUA will remain  in effect (meaning this test can be used) for the duration of the COVID-19 declaration under Section 56 4(b)(1) of the Act, 21 U.S.C. section 360bbb-3(b)(1), unless the authorization is terminated or revoked sooner. Performed at [facility], [address] [place], [place] [address]   MRSA PCR Screening     Status: None   Collection Time: 07/13/19  8:57 PM   Specimen: Nasopharyngeal  Result Value Ref Range Status   MRSA by PCR NEGATIVE NEGATIVE Final    Comment:        The GeneXpert MRSA Assay (FDA approved for NASAL specimens only), is one component of a comprehensive MRSA colonization surveillance program. It is not intended to diagnose MRSA infection nor to guide or monitor treatment for MRSA infections. Performed at [facility], [address], [place], [place] [address]     Radiology Studies: No results found.   T.  [facility]  If 7PM-7AM, please contact night-coverage [email or web address] Password [email or web address] 07/20/2019, 12:19 PM

## 2019-07-20 NOTE — Progress Notes (Signed)
ANTICOAGULATION CONSULT NOTE - Follow Up Consult  Pharmacy Consult for heparin Indication: atrial fibrillation with RVR  Allergies  Allergen Reactions  . Iodine-131 Hives and Shortness Of Breath  . 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 Penicillin G Rash    Has patient had a PCN reaction causing immediate rash, facial/tongue/throat swelling, SOB or lightheadedness with hypotension: Yes Has patient had a PCN reaction causing severe rash involving mucus membranes or skin necrosis: Unk Has patient had a PCN reaction that required hospitalization: Unk Has patient had a PCN reaction occurring within the last 10 years: No If all of the above answers are "NO", then may proceed with Cephalosporin use.     Patient Measurements: Height: 5\' 4"  (162.6 cm) Weight: 195 lb 5.2 oz (88.6 kg) IBW/kg (Calculated) : 54.7 Heparin Dosing Weight: 72 kg  Vital Signs: Temp: 96.7 F (35.9 C) (11/13 0800) Temp Source: Axillary (11/13 0800) BP: 112/64 (11/13 0600) Pulse Rate: 66 (11/13 0600)  Labs: Recent Labs    07/18/19 0425 07/18/19 0917  07/19/19 0015 07/19/19 0439 07/20/19 0431  HGB 9.9*  --   --   --  8.8* 9.2*  HCT 30.8*  --   --   --  27.6* 28.5*  PLT 110*  --   --   --  115* 138*  HEPARINUNFRC 0.24*  --    < > 0.38 0.32 0.50  CREATININE  --  0.46  --   --  0.47 0.41*   < > = values in this interval not displayed.    Estimated Creatinine Clearance: 72.6 mL/min (A) (by C-G formula based on SCr of 0.41 mg/dL (L)).   Assessment: Patient's a 68 y.o F with hx renal transplant presented to the ED on 11/5 after she was found unresponsive by EMS.  Head CT showed no acute findings.  She was subsequently intubated and started on heparin drip for Afib with RVR.  Today, 07/20/2019: - heparin level is therapeutic at 0.50 - hgb up slightly to 9.2 - plts low but increasing today 138 - no bleeding documented  Goal of Therapy:  Heparin level 0.3-0.7  units/ml Monitor platelets by anticoagulation protocol: Yes   Plan:  - Continue heparin drip at  1650 units/hr - daily heparin level & CBC - monitor for s/s bleeding   Royetta Asal, PharmD, BCPS 07/20/2019 8:44 AM

## 2019-07-20 NOTE — Progress Notes (Addendum)
Pt still unable to void. No UOP on night shift. RN bladder scanned pt at 0813 and 453 mLs were detected. Dr. Cyndia Skeeters paged by RN. Orders given to In-and-Out pt, and insert foley catheter after 3 in-and-outs. In-and-out catheterization resulted in 525 ccs of yellow urine; cloudy. RN will continue to monitor and assess pt.

## 2019-07-21 ENCOUNTER — Inpatient Hospital Stay (HOSPITAL_COMMUNITY): Payer: Medicare Other

## 2019-07-21 DIAGNOSIS — E8770 Fluid overload, unspecified: Secondary | ICD-10-CM

## 2019-07-21 DIAGNOSIS — R638 Other symptoms and signs concerning food and fluid intake: Secondary | ICD-10-CM

## 2019-07-21 DIAGNOSIS — E8809 Other disorders of plasma-protein metabolism, not elsewhere classified: Secondary | ICD-10-CM

## 2019-07-21 LAB — BASIC METABOLIC PANEL
Anion gap: 5 (ref 5–15)
BUN: 9 mg/dL (ref 8–23)
CO2: 23 mmol/L (ref 22–32)
Calcium: 9.1 mg/dL (ref 8.9–10.3)
Chloride: 110 mmol/L (ref 98–111)
Creatinine, Ser: 0.53 mg/dL (ref 0.44–1.00)
GFR calc Af Amer: >60 mL/min (ref >60)
GFR calc non Af Amer: >60 mL/min (ref >60)
Glucose, Bld: 96 mg/dL (ref 70–99)
Potassium: 4.5 mmol/L (ref 3.5–5.1)
Sodium: 138 mmol/L (ref 135–145)

## 2019-07-21 LAB — GLUCOSE, CAPILLARY
Glucose-Capillary: 118 mg/dL — ABNORMAL HIGH (ref 70–99)
Glucose-Capillary: 133 mg/dL — ABNORMAL HIGH (ref 70–99)
Glucose-Capillary: 84 mg/dL (ref 70–99)
Glucose-Capillary: 90 mg/dL (ref 70–99)
Glucose-Capillary: 95 mg/dL (ref 70–99)

## 2019-07-21 LAB — BRAIN NATRIURETIC PEPTIDE: B Natriuretic Peptide: 218.5 pg/mL — ABNORMAL HIGH (ref 0.0–100.0)

## 2019-07-21 LAB — CBC
HCT: 32.5 % — ABNORMAL LOW (ref 36.0–46.0)
Hemoglobin: 9.9 g/dL — ABNORMAL LOW (ref 12.0–15.0)
MCH: 34.5 pg — ABNORMAL HIGH (ref 26.0–34.0)
MCHC: 30.5 g/dL (ref 30.0–36.0)
MCV: 113.2 fL — ABNORMAL HIGH (ref 80.0–100.0)
Platelets: 170 10*3/uL (ref 150–400)
RBC: 2.87 MIL/uL — ABNORMAL LOW (ref 3.87–5.11)
RDW: 15.5 % (ref 11.5–15.5)
WBC: 6 10*3/uL (ref 4.0–10.5)
nRBC: 0 % (ref 0.0–0.2)

## 2019-07-21 LAB — CK: Total CK: 17 U/L — ABNORMAL LOW (ref 38–234)

## 2019-07-21 LAB — HEPARIN LEVEL (UNFRACTIONATED)
Heparin Unfractionated: 0.77 IU/mL — ABNORMAL HIGH (ref 0.30–0.70)
Heparin Unfractionated: 1.32 IU/mL — ABNORMAL HIGH (ref 0.30–0.70)

## 2019-07-21 LAB — PHOSPHORUS: Phosphorus: 3.3 mg/dL (ref 2.5–4.6)

## 2019-07-21 LAB — MAGNESIUM: Magnesium: 1.7 mg/dL (ref 1.7–2.4)

## 2019-07-21 LAB — ALBUMIN: Albumin: 2.1 g/dL — ABNORMAL LOW (ref 3.5–5.0)

## 2019-07-21 MED ORDER — HEPARIN (PORCINE) 25000 UT/250ML-% IV SOLN
1550.0000 [IU]/h | INTRAVENOUS | Status: DC
  Administered 2019-07-21: 1550 [IU]/h via INTRAVENOUS
  Filled 2019-07-21: qty 250

## 2019-07-21 MED ORDER — MAGNESIUM SULFATE 2 GM/50ML IV SOLN
2.0000 g | Freq: Once | INTRAVENOUS | Status: AC
  Administered 2019-07-21: 2 g via INTRAVENOUS
  Filled 2019-07-21: qty 50

## 2019-07-21 MED ORDER — BETHANECHOL CHLORIDE 25 MG PO TABS
25.0000 mg | ORAL_TABLET | Freq: Three times a day (TID) | ORAL | Status: DC
  Administered 2019-07-21 – 2019-07-26 (×16): 25 mg via ORAL
  Filled 2019-07-21 (×17): qty 1

## 2019-07-21 MED ORDER — FUROSEMIDE 40 MG PO TABS
40.0000 mg | ORAL_TABLET | Freq: Every day | ORAL | Status: DC
  Administered 2019-07-21: 40 mg via ORAL
  Filled 2019-07-21: qty 1

## 2019-07-21 MED ORDER — APIXABAN 5 MG PO TABS
5.0000 mg | ORAL_TABLET | Freq: Two times a day (BID) | ORAL | Status: DC
  Administered 2019-07-21 – 2019-07-26 (×11): 5 mg via ORAL
  Filled 2019-07-21 (×11): qty 1

## 2019-07-21 NOTE — Progress Notes (Signed)
ANTICOAGULATION CONSULT NOTE - Follow Up Consult  Pharmacy Consult for heparin Indication: atrial fibrillation with RVR  Allergies  Allergen Reactions  . Iodine-131 Hives and Shortness Of Breath  . 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 Penicillin G Rash    Has patient had a PCN reaction causing immediate rash, facial/tongue/throat swelling, SOB or lightheadedness with hypotension: Yes Has patient had a PCN reaction causing severe rash involving mucus membranes or skin necrosis: Unk Has patient had a PCN reaction that required hospitalization: Unk Has patient had a PCN reaction occurring within the last 10 years: No If all of the above answers are "NO", then may proceed with Cephalosporin use.     Patient Measurements: Height: 5\' 4"  (162.6 cm) Weight: 197 lb 8.5 oz (89.6 kg) IBW/kg (Calculated) : 54.7 Heparin Dosing Weight: 72 kg  Vital Signs: Temp: 98 F (36.7 C) (11/14 0727) Temp Source: Axillary (11/14 0727) BP: 91/70 (11/14 0900) Pulse Rate: 42 (11/14 0900)  Labs: Recent Labs    07/19/19 0439 07/20/19 0431 07/21/19 0822  HGB 8.8* 9.2* 9.9*  HCT 27.6* 28.5* 32.5*  PLT 115* 138* 170  HEPARINUNFRC 0.32 0.50 0.77*  CREATININE 0.47 0.41* 0.53  CKTOTAL  --   --  17*    Estimated Creatinine Clearance: 73 mL/min (by C-G formula based on SCr of 0.53 mg/dL).   Assessment: Patient's a 68 y.o F with hx renal transplant presented to the ED on 11/5 after she was found unresponsive by EMS.  Head CT showed no acute findings.  She was subsequently intubated and started on heparin drip for Afib with RVR.  Today, 07/21/2019: - heparin level is supratherapeutic at 0.77 - hgb up slightly to 9.9 - plts low but increasing today 170 - no line or bleeding issues per RN   Goal of Therapy:  Heparin level 0.3-0.7 units/ml Monitor platelets by anticoagulation protocol: Yes   Plan:  - Decrease heparin drip at  1550 units/hr - Check HL 8 hours  after rate change  - daily heparin level & CBC - monitor for s/s bleeding   Royetta Asal, PharmD, BCPS 07/21/2019 9:39 AM

## 2019-07-21 NOTE — Progress Notes (Signed)
PROGRESS NOTE  Anna [name] JJO:[id number] DOB: 07-10-1951   PCP: Patient, No Pcp Per  Patient is from: Home.  Per patient's daughter, lives alone in an apartment, independent for most ADL S, ambulates by holding to furniture's and fairly oriented.  Does not have walker or cane.  DOA: 07/12/2019 LOS: 9  Brief Narrative / Interim history: 68 year old female with history of asthma, HTN, renal transplant on immuno modulators and alcohol use presented 07/12/2019 after found down and minimally responsive at home.  It was noted that she had Tylenol pills and empty alcohol bottles around her when EMS found her.  She was hypoxic to 60s and hypotensive with SBP to 80s per EMS.  In ED, head CT and cervical spine were negative.  EtOH, Tylenol, ASA levels are normal.  She was noted to be in A. fib with RVR in low 100s.  She was also encephalopathic.  PCCM consulted 11/6 and patient was intubated.   In ICU, patient was treated for acute encephalopathy thought to be due to alcohol withdrawal, respiratory failure with hypercarbia and hypoxia and A. fib with RVR.  Infectious work-up including blood culture, urine culture, sputum culture and COVID-19 negative. Patient received empiric antibiotics from 11/5-11/10. Echo on 11/6 with EF of 55 to 60%, indeterminate DD and RVSP to 46 mmHg.  She was extubated to nasal cannula 11/9.  TRH assumed care 07/19/2019.  Subjective: No major events overnight of this morning.  She had about 600 cc urine output on her second I&O cath last night.  She complains of low back pain.  Denies chest pain, dyspnea or abdominal pain.  She is oriented to self, partial place, month and year but not date.    Objective: Vitals:   07/21/19 0700 07/21/19 0727 07/21/19 0800 07/21/19 0900  BP: 104/63  (!) 144/96 91/70  Pulse: 71  77 (!) 42  Resp: 16  15 15   Temp:  98 F (36.7 C)    TempSrc:  Axillary    SpO2: 100%  100% 100%  Weight:      Height:        Intake/Output Summary  (Last 24 hours) at 07/21/2019 1006 Last data filed at 07/21/2019 0916 Gross per 24 hour  Intake 492.54 ml  Output 600 ml  Net -107.46 ml   Filed Weights   07/19/19 0439 07/20/19 0432 07/21/19 0500  Weight: 88.5 kg 88.6 kg 89.6 kg    Examination:  GENERAL: No acute distress.  Appears well.  HEENT: MMM.  Vision and hearing grossly intact.  NECK: Supple.  No apparent JVD.  RESP:  No IWOB.  Fair air movement.  Bilateral rales. CVS:  RRR. Heart sounds normal.  ABD/GI/GU: Bowel sounds present. Soft. Non tender.  MSK/EXT:  Moves extremities. No apparent deformity.  2+ pitting dependent edema in LUE. 1+ in other extremities. SKIN: no apparent skin lesion or wound NEURO: Awake, alert and oriented self, partial place, month and year.  No apparent focal neuro deficit. PSYCH: Calm. Normal affect.   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  UDS negative.  Thyroid panel normal.  CT head without acute finding.  Should be outside withdrawal window now.  No obvious source of infection but received empiric antibiotics 11/6-11/10.  Encephalopathy improving. -Continue supportive care, multivitamins and thiamine's. -Treat treatable causes -Frequent reorientation and delirium precautions  Acute respiratory failure with hypoxia and hypercarbia: Likely due to encephalopathy and alcohol intoxication.  Has history of asthma but only takes as needed albuterol. -ETT 11/6-11/9 -Wean oxygen as able-currently saturating 100% on 1 L. -Continue as needed breathing treatments -Aspiration precaution  Fluid overload likely due to hypoalbuminemia versus CHF.: Echo on 11/6 with EF of 55 to 60%, indeterminate DD and moderate PASP to 46.  She has dependent edema and about 38lbs weight gain since admission.  No cardiopulmonary symptoms.  Saturating at 100% on 1 L -Portable chest x-ray and  BNP -Start p.o. Lasix 40 mg daily-may have to change it to IV if no significant response. -Monitor fluid status, renal function and electrolytes -Optimize nutrition  Paroxysmal A. Fib/sinus pauses: CHA2DS2-VASc score> 3.  Echo as above.  Discontinued nodal blocking agent due to frequent sinus pauses. -Closely monitor electrolytes and replenish aggressively -Change heparin to Eliquis.  Hypotension: Normotensive off antihypertensive medications -Continue monitoring  Moderate pulmonary hypertension: Echo revealed RVSP to 46 mmHg. -Gentle diuretics as above.  History of renal transplant on CellCept and Prograf: Renal function is stable.  Fair urine output. -Continue home CellCept and Prograf  Post intubation dysphagia -Appreciate guidance by SLP and dietitian-currently on full liquid diet  Hypoglycemia: Resolved.  Likely due to poor p.o. intake.  Cortisol level within normal range. -Continue monitoring  Anemia of chronic disease: Anemia panel consistent with this. Baseline 12-13.   No obvious bleeding. -Hgb 18.1 (admit)>11.2 (next day)>>> 8.8>9.9 -Continue monitoring.  Thrombocytopenia: Likely due to alcohol and acute illness.  Resolved. -Continue monitoring  History of mood disorder -Continue home Wellbutrin -Reduce on trazodone to 100 mg daily.  Acute urinary retention: Had about 600 cc UOP 4on second I&O cath last night -Start Urecholine 25 mg 3 times daily -We will place Foley catheter if no resolution with this.  Deconditioning/debility/generalized weakness: Far from baseline.  See above. -PT/OT-recommended SNF.  Pressure Injury 07/12/19 Buttocks Right Deep Tissue Injury - Purple or maroon localized area of discolored intact skin or blood-filled blister due to damage of underlying soft tissue from pressure and/or shear. Blister with three stripes and purple disco (Active)  07/12/19   Location: Buttocks  Location Orientation: Right  Staging: Deep Tissue Injury - Purple  or maroon localized area of discolored intact skin or blood-filled blister due to damage of underlying soft tissue from pressure and/or shear.  Wound Description (Comments): Blister with three stripes and purple discoloration  Present on Admission: Yes     Pressure Injury 07/12/19 Ischial tuberosity Lateral;Right Deep Tissue Injury - Purple or maroon localized area of discolored intact skin or blood-filled blister due to damage of underlying soft tissue from pressure and/or shear. open blister with purple  (Active)  07/12/19   Location: Ischial tuberosity  Location Orientation: Lateral;Right  Staging: Deep Tissue Injury - Purple or maroon localized area of discolored intact skin or blood-filled blister due to damage of underlying soft tissue from pressure and/or shear.  Wound Description (Comments): open blister with purple discolaration.  Present on Admission: Yes     Nutrition Problem: Inadequate oral intake Etiology: inability to eat  Signs/Symptoms: NPO status  Interventions: Boost Breeze, Refer to RD note for recommendations   DVT prophylaxis: On heparin drip for A. fib Code Status: Partial code-NIPPV/intubation/MV only Family Communication: Updated patient's daughter over the phone 11/12. Disposition Plan: Remains inpatient due to fluid overload, arrhythmia and encephalopathy  Consultants: PCCM  Procedures:  ETT 11/6-11/9  Microbiology summarized: 11/6-COVID-19 negative 11/6-MRSA PCR negative 11/5-blood cultures negative so far 11/5-urine cultures negative so far   Sch Meds:  Scheduled Meds: .  bethanechol  25 mg Oral TID  . buPROPion  300 mg Oral Daily  . Chlorhexidine Gluconate Cloth  6 each Topical Daily  . feeding supplement  1 Container Oral TID BM  . folic acid  1 mg Oral Daily  . furosemide  40 mg Oral Daily  . gabapentin  100 mg Oral TID  . Gerhardt's butt cream   Topical TID  . insulin aspart  2-6 Units Subcutaneous Q4H  . mouth rinse  15 mL Mouth Rinse  BID  . mycophenolate  500 mg Oral BID  . nystatin cream   Topical BID  . pantoprazole  40 mg Oral Daily  . sodium chloride flush  10-40 mL Intracatheter Q12H  . tacrolimus  1 mg Oral QPM  . tacrolimus  2 mg Oral q morning - 10a  . thiamine  100 mg Oral Daily  . traZODone  100 mg Oral QHS   Continuous Infusions: . heparin 1,550 Units/hr (07/21/19 0948)   PRN Meds:.acetaminophen, [DISCONTINUED] ondansetron **OR** ondansetron (ZOFRAN) IV, sodium chloride flush  Antimicrobials: Anti-infectives (From admission, onward)   Start     Dose/Rate Route Frequency Ordered Stop   07/14/19 2200  vancomycin (VANCOCIN) 1,500 mg in sodium chloride 0.9 % 500 mL IVPB  Status:  Discontinued     1,500 mg 250 mL/hr over 120 Minutes Intravenous Every 48 hours 07/12/19 2229 07/13/19 1325   07/14/19 2200  vancomycin (VANCOCIN) 1,500 mg in sodium chloride 0.9 % 500 mL IVPB  Status:  Discontinued     1,500 mg 250 mL/hr over 120 Minutes Intravenous Every 48 hours 07/14/19 0915 07/14/19 1151   07/14/19 1130  aztreonam (AZACTAM) 2 g in sodium chloride 0.9 % 100 mL IVPB  Status:  Discontinued     2 g 200 mL/hr over 30 Minutes Intravenous Every 8 hours 07/13/19 1504 07/17/19 0812   07/13/19 1100  levofloxacin (LEVAQUIN) IVPB 750 mg  Status:  Discontinued     750 mg 100 mL/hr over 90 Minutes Intravenous Every 24 hours 07/13/19 1053 07/13/19 1055   07/13/19 1100  levofloxacin (LEVAQUIN) IVPB 500 mg  Status:  Discontinued     500 mg 100 mL/hr over 60 Minutes Intravenous Every 24 hours 07/13/19 1055 07/13/19 1319   07/12/19 2200  vancomycin (VANCOCIN) 1,500 mg in sodium chloride 0.9 % 500 mL IVPB     1,500 mg 250 mL/hr over 120 Minutes Intravenous  Once 07/12/19 2134 07/13/19 0224   07/12/19 2145  levofloxacin (LEVAQUIN) IVPB 750 mg  Status:  Discontinued     750 mg 100 mL/hr over 90 Minutes Intravenous  Once 07/12/19 2134 07/13/19 0900       I have personally reviewed the following labs and images: CBC:  Recent Labs  Lab 07/17/19 0800 07/18/19 0425 07/19/19 0439 07/20/19 0431 07/21/19 0822  WBC 9.0 11.2* 7.9 6.1 6.0  HGB 9.8* 9.9* 8.8* 9.2* 9.9*  HCT 30.4* 30.8* 27.6* 28.5* 32.5*  MCV 106.7* 107.3* 109.1* 109.6* 113.2*  PLT 89* 110* 115* 138* 170   BMP &GFR Recent Labs  Lab 07/16/19 0500 07/16/19 2148 07/17/19 0355 07/18/19 0425 07/18/19 0917 07/19/19 0439 07/20/19 0431 07/21/19 0822  NA 145 143 141  --  141 138 139 138  K 3.5 3.8 3.6  --  4.2 4.8 4.3 4.5  CL 122* 118* 116*  --  117* 111 111 110  CO2 19* 19* 19*  --  21* GLUCOSE 159* 132* 148*  --  131* 87 129*  96  BUN 34* 30* 28*  --  CREATININE 0.75 0.70 0.60  --  0.46 0.47 0.41* 0.53  CALCIUM 8.9 8.7* 8.7*  --  8.3* 8.5* 8.9 9.1  MG  --  1.6* 2.1  --   --  1.6* 2.0 1.7  PHOS 2.0*  --  2.4* 2.0*  --   --  3.1 3.3   Estimated Creatinine Clearance: 73 mL/min (by C-G formula based on SCr of 0.53 mg/dL). Liver & Pancreas: Recent Labs  Lab 07/14/19 1130 07/16/19 0500 07/18/19 0917 07/19/19 0439 07/21/19 0822  AST 33 18 13* 18  --   ALT --   ALKPHOS 46 48 50 47  --   BILITOT 1.5* 0.9 1.1 0.9  --   PROT 4.1* 4.5* 4.3* 4.1*  --   ALBUMIN 2.0* 2.0* 1.8* 1.8* 2.1*   No results for input(s): LIPASE, AMYLASE in the last 168 hours. No results for input(s): AMMONIA in the last 168 hours. Diabetic: No results for input(s): HGBA1C in the last 72 hours. Recent Labs  Lab 07/20/19 1137 07/20/19 1521 07/20/19 1930 07/20/19 2337 07/21/19 0721  GLUCAP 136* 97 98 109* 95   Cardiac Enzymes: Recent Labs  Lab 07/21/19 0822  CKTOTAL 17*   No results for input(s): PROBNP in the last 8760 hours. Coagulation Profile: Recent Labs  Lab 07/15/19 0400  INR 1.6*   Thyroid Function Tests: Recent Labs    07/19/19 0730  TSH 3.123  FREET4 0.79   Lipid Profile: No results for input(s): CHOL, HDL, LDLCALC, TRIG, CHOLHDL, LDLDIRECT in the last 72 hours. Anemia Panel: Recent Labs     07/20/19 0758  VITAMINB12 814  FOLATE 11.6  FERRITIN 2,766*  TIBC 113*  IRON 33  RETICCTPCT 7.4*   Urine analysis:    Component Value Date/Time   COLORURINE AMBER (A) 07/12/2019 2200   APPEARANCEUR TURBID (A) 07/12/2019 2200   LABSPEC 1.025 07/12/2019 2200   PHURINE 5.0 07/12/2019 2200   GLUCOSEU 50 (A) 07/12/2019 2200   HGBUR NEGATIVE 07/12/2019 2200   BILIRUBINUR SMALL (A) 07/12/2019 2200   KETONESUR NEGATIVE 07/12/2019 2200   PROTEINUR 30 (A) 07/12/2019 2200   NITRITE NEGATIVE 07/12/2019 2200   LEUKOCYTESUR NEGATIVE 07/12/2019 2200   Sepsis Labs: Invalid input(s): PROCALCITONIN, LACTICIDVEN  Microbiology: Recent Results (from the past 240 hour(s))  Urine culture     Status: None   Collection Time: 07/12/19 10:00 PM   Specimen: Urine, Catheterized  Result Value Ref Range Status   Specimen Description URINE, CATHETERIZED  Final   Special Requests NONE  Final   Culture   Final    NO GROWTH Performed at [facility], [address], [place], [place] [address]    Report Status 07/14/2019 FINAL  Final  Blood culture (routine x 2)     Status: None   Collection Time: 07/12/19 10:03 PM   Specimen: BLOOD RIGHT FOREARM  Result Value Ref Range Status   Specimen Description BLOOD RIGHT FOREARM  Final   Special Requests   Final    BOTTLES DRAWN AEROBIC ONLY Blood Culture adequate volume   Culture   Final    NO GROWTH 5 DAYS Performed at [facility], [address] [place], [place] [address]    Report Status 07/17/2019 FINAL  Final  SARS CORONAVIRUS 2 (TAT 6-24 HRS) Nasopharyngeal Nasopharyngeal Swab     Status: None   Collection Time: 07/13/19 12:48 AM  Specimen: Nasopharyngeal Swab  Result Value Ref Range Status   SARS Coronavirus 2 NEGATIVE NEGATIVE Final    Comment: (NOTE) SARS-CoV-2 target nucleic acids are NOT DETECTED. The SARS-CoV-2 RNA is generally detectable in upper and lower respiratory specimens during the acute phase of infection. Negative  results do not preclude SARS-CoV-2 infection, do not rule out co-infections with other pathogens, and should not be used as the sole basis for treatment or other patient management decisions. Negative results must be combined with clinical observations, patient history, and epidemiological information. The expected result is Negative. Fact Sheet for Patients: HairSlick.nohttps://www.fda.gov/media/138098/download Fact Sheet for Healthcare Providers: quierodirigir.comhttps://www.fda.gov/media/138095/download This test is not yet approved or cleared by the [place] FDA and  has been authorized for detection and/or diagnosis of SARS-CoV-2 by FDA under an Emergency Use Authorization (EUA). This EUA will remain  in effect (meaning this test can be used) for the duration of the COVID-19 declaration under Section 56 4(b)(1) of the Act, 21 U.S.C. section 360bbb-3(b)(1), unless the authorization is terminated or revoked sooner. Performed at [facility], [address] [place], [place] [address]   MRSA PCR Screening     Status: None   Collection Time: 07/13/19  8:57 PM   Specimen: Nasopharyngeal  Result Value Ref Range Status   MRSA by PCR NEGATIVE NEGATIVE Final    Comment:        The GeneXpert MRSA Assay (FDA approved for NASAL specimens only), is one component of a comprehensive MRSA colonization surveillance program. It is not intended to diagnose MRSA infection nor to guide or monitor treatment for MRSA infections. Performed at [facility], [address] [place], [place] [address]     Radiology Studies: No results found.  Reda Gettis T. Vitaliy Eisenhour [facility]  If 7PM-7AM, please contact night-coverage [email or web address] Password [email or web address] 07/21/2019, 10:06 AM

## 2019-07-21 NOTE — Progress Notes (Signed)
Pt unable to void with external catheter. RN bladder scanned pt at 1150; 422ccs were detected. In-and-out catheterization performed by RN. This was the third in-and-out cath since yesterday; Dr. Cyndia Skeeters aware of urinary retention and plans to place foley order if urinary retention persists. RN will continue to monitor UOP.

## 2019-07-21 NOTE — Progress Notes (Signed)
ANTICOAGULATION CONSULT NOTE - Follow Up Consult  Pharmacy Consult for apixaban Indication: atrial fibrillation with RVR  Allergies  Allergen Reactions  . Iodine-131 Hives and Shortness Of Breath  . 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 Penicillin G Rash    Has patient had a PCN reaction causing immediate rash, facial/tongue/throat swelling, SOB or lightheadedness with hypotension: Yes Has patient had a PCN reaction causing severe rash involving mucus membranes or skin necrosis: Unk Has patient had a PCN reaction that required hospitalization: Unk Has patient had a PCN reaction occurring within the last 10 years: No If all of the above answers are "NO", then may proceed with Cephalosporin use.     Patient Measurements: Height: 5\' 4"  (162.6 cm) Weight: 197 lb 8.5 oz (89.6 kg) IBW/kg (Calculated) : 54.7 Heparin Dosing Weight: 72 kg  Vital Signs: Temp: 97.9 F (36.6 C) (11/14 1120) Temp Source: Axillary (11/14 1120) BP: 93/68 (11/14 1000) Pulse Rate: 80 (11/14 1000)  Labs: Recent Labs    07/19/19 0439 07/20/19 0431 07/21/19 0822  HGB 8.8* 9.2* 9.9*  HCT 27.6* 28.5* 32.5*  PLT 115* 138* 170  HEPARINUNFRC 0.32 0.50 0.77*  CREATININE 0.47 0.41* 0.53  CKTOTAL  --   --  17*    Estimated Creatinine Clearance: 73 mL/min (by C-G formula based on SCr of 0.53 mg/dL).   Assessment: Patient's a 68 y.o F with hx renal transplant presented to the ED on 11/5 after she was found unresponsive by EMS.  Head CT showed no acute findings.  She was subsequently intubated and started on heparin drip for Afib with RVR.  Today, 07/21/2019: - heparin level is supratherapeutic at 0.77 - hgb up slightly to 9.9 - plts low but increasing today 170 - no line or bleeding issues per RN   - Pt is now being transitioned from apixaban to heparin   Goal of Therapy:  Heparin level 0.3-0.7 units/ml Monitor platelets by anticoagulation protocol: Yes   Plan:  -  Stop heparin and start apixaban 5 mg PO BID  -  Monitor CBC, Scr as available  - monitor for s/s bleeding   Royetta Asal, PharmD, BCPS 07/21/2019 12:01 PM

## 2019-07-22 ENCOUNTER — Encounter (HOSPITAL_COMMUNITY): Payer: Self-pay

## 2019-07-22 ENCOUNTER — Other Ambulatory Visit: Payer: Self-pay

## 2019-07-22 DIAGNOSIS — I5031 Acute diastolic (congestive) heart failure: Secondary | ICD-10-CM

## 2019-07-22 LAB — BASIC METABOLIC PANEL
Anion gap: 5 (ref 5–15)
BUN: 8 mg/dL (ref 8–23)
CO2: 24 mmol/L (ref 22–32)
Calcium: 9.1 mg/dL (ref 8.9–10.3)
Chloride: 109 mmol/L (ref 98–111)
Creatinine, Ser: 0.55 mg/dL (ref 0.44–1.00)
GFR calc Af Amer: >60 mL/min (ref >60)
GFR calc non Af Amer: >60 mL/min (ref >60)
Glucose, Bld: 143 mg/dL — ABNORMAL HIGH (ref 70–99)
Potassium: 3.9 mmol/L (ref 3.5–5.1)
Sodium: 138 mmol/L (ref 135–145)

## 2019-07-22 LAB — GLUCOSE, CAPILLARY
Glucose-Capillary: 140 mg/dL — ABNORMAL HIGH (ref 70–99)
Glucose-Capillary: 93 mg/dL (ref 70–99)
Glucose-Capillary: 100 mg/dL — ABNORMAL HIGH (ref 70–99)
Glucose-Capillary: 81 mg/dL (ref 70–99)
Glucose-Capillary: 119 mg/dL — ABNORMAL HIGH (ref 70–99)

## 2019-07-22 LAB — HEPARIN LEVEL (UNFRACTIONATED): Heparin Unfractionated: >2.2 IU/mL — ABNORMAL HIGH (ref 0.30–0.70)

## 2019-07-22 LAB — CBC
HCT: 29.4 % — ABNORMAL LOW (ref 36.0–46.0)
Hemoglobin: 9.2 g/dL — ABNORMAL LOW (ref 12.0–15.0)
MCH: 34.2 pg — ABNORMAL HIGH (ref 26.0–34.0)
MCHC: 31.3 g/dL (ref 30.0–36.0)
MCV: 109.3 fL — ABNORMAL HIGH (ref 80.0–100.0)
Platelets: 193 10*3/uL (ref 150–400)
RBC: 2.69 MIL/uL — ABNORMAL LOW (ref 3.87–5.11)
RDW: 15.5 % (ref 11.5–15.5)
WBC: 4 10*3/uL (ref 4.0–10.5)
nRBC: 0.5 % — ABNORMAL HIGH (ref 0.0–0.2)

## 2019-07-22 LAB — MAGNESIUM: Magnesium: 1.7 mg/dL (ref 1.7–2.4)

## 2019-07-22 LAB — PHOSPHORUS: Phosphorus: 3.6 mg/dL (ref 2.5–4.6)

## 2019-07-22 MED ORDER — PNEUMOCOCCAL VAC POLYVALENT 25 MCG/0.5ML IJ INJ
0.5000 mL | INJECTION | INTRAMUSCULAR | Status: AC
  Administered 2019-07-23: 0.5 mL via INTRAMUSCULAR
  Filled 2019-07-22: qty 0.5

## 2019-07-22 MED ORDER — INFLUENZA VAC A&B SA ADJ QUAD 0.5 ML IM PRSY
0.5000 mL | PREFILLED_SYRINGE | INTRAMUSCULAR | Status: AC
  Administered 2019-07-23: 0.5 mL via INTRAMUSCULAR
  Filled 2019-07-22: qty 0.5

## 2019-07-22 MED ORDER — FUROSEMIDE 10 MG/ML IJ SOLN
20.0000 mg | Freq: Every day | INTRAMUSCULAR | Status: DC
  Administered 2019-07-22 – 2019-07-25 (×4): 20 mg via INTRAVENOUS
  Filled 2019-07-22 (×4): qty 2

## 2019-07-22 NOTE — [place] FL2 (Signed)
[place] MEDICAID FL2 LEVEL OF CARE SCREENING TOOL     IDENTIFICATION  Patient Name: Anna [name] Birthdate: [birth year] Sex: female Admission Date (Current Location): 07/12/2019  [facility] and [place] Number:  Producer, television/film/video and Address:  [facility],  [address] [place], [place] [address]      Provider Number: [id number]  Attending Physician Name and Address:  Almon Hercules, MD  Relative Name and Phone Number:  Darl Pikes [place],[phone]    Current Level of Care: SNF Recommended Level of Care: Skilled Nursing Facility Prior Approval Number:    Date Approved/Denied:   PASRR Number: [id number]  Discharge Plan: SNF    Current Diagnoses: Patient Active Problem List   Diagnosis Date Noted  . Pressure injury of skin 07/18/2019  . Hypernatremia   . Hypothermia   . SIRS (systemic inflammatory response syndrome) (HCC)   . Altered mental status   . Renal insufficiency   . Hypotension   . Acute hypoxemic respiratory failure (HCC)   . Acute metabolic encephalopathy 07/12/2019  . Acute encephalopathy 10/10/2017  . Hematemesis 10/10/2017  . Atrial fibrillation (HCC) 10/10/2017    Orientation RESPIRATION BLADDER Height & Weight     Self  O2(1L) External catheter, Incontinent Weight: 193 lb 9 oz (87.8 kg) Height:  5\' 4"  (162.6 cm)  BEHAVIORAL SYMPTOMS/MOOD NEUROLOGICAL BOWEL NUTRITION STATUS      Incontinent Diet(clear liquid)  AMBULATORY STATUS COMMUNICATION OF NEEDS Skin   Extensive Assist Verbally                         Personal Care Assistance Level of Assistance  Bathing, Feeding, Dressing Bathing Assistance: Limited assistance Feeding assistance: Independent Dressing Assistance: Limited assistance     Functional Limitations Info  Sight, Hearing, Speech Sight Info: Adequate Hearing Info: Adequate Speech Info: Adequate    SPECIAL CARE FACTORS FREQUENCY  PT (By licensed PT), OT (By licensed OT)     PT Frequency: 5x wk OT  Frequency: 5x wk            Contractures Contractures Info: Not present    Additional Factors Info  Code Status, Allergies Code Status Info: partial Allergies Info: Iodine-131 Nsaids Penicillin G           Current Medications (07/22/2019):  This is the current hospital active medication list Current Facility-Administered Medications  Medication Dose Route Frequency Provider Last Rate Last Dose  . acetaminophen (TYLENOL) tablet 650 mg  650 mg Oral Q4H PRN 07/24/2019 T, MD   650 mg at 07/18/19 1547  . apixaban (ELIQUIS) tablet 5 mg  5 mg Oral BID 13/11/20 T, MD   5 mg at 07/22/19 1024  . bethanechol (URECHOLINE) tablet 25 mg  25 mg Oral TID 07/24/19 T, MD   25 mg at 07/22/19 1024  . buPROPion (WELLBUTRIN XL) 24 hr tablet 300 mg  300 mg Oral Daily 07/24/19 T, MD   300 mg at 07/22/19 1024  . Chlorhexidine Gluconate Cloth 2 % PADS 6 each  6 each Topical Daily 07/24/19, MD   6 each at 07/22/19 1025  . feeding supplement (BOOST / RESOURCE BREEZE) liquid 1 Container  1 Container Oral TID BM 07/24/19, MD   1 Container at 07/22/19 1025  . folic acid (FOLVITE) tablet 1 mg  1 mg Oral Daily Gonfa, Taye T, MD   1 mg at 07/22/19 1025  . furosemide (LASIX) injection 20 mg  20  mg Intravenous Daily Wendee Beavers T, MD   20 mg at 07/22/19 1025  . gabapentin (NEURONTIN) capsule 100 mg  100 mg Oral TID Wendee Beavers T, MD   100 mg at 07/22/19 1024  . Gerhardt's butt cream   Topical TID Wendee Beavers T, MD      . insulin aspart (novoLOG) injection 2-6 Units  2-6 Units Subcutaneous Q4H Mercy Riding, MD   2 Units at 07/22/19 0547  . MEDLINE mouth rinse  15 mL Mouth Rinse BID Wendee Beavers T, MD   15 mL at 07/22/19 1025  . mycophenolate (CELLCEPT) oral suspension 50 mg/mL  500 mg Oral BID Wendee Beavers T, MD   500 mg at 07/22/19 1026  . nystatin cream (MYCOSTATIN)   Topical BID Gonfa, Taye T, MD      . ondansetron (ZOFRAN) injection 4 mg  4 mg Intravenous Q6H PRN Gonfa, Taye T, MD      .  pantoprazole (PROTONIX) EC tablet 40 mg  40 mg Oral Daily Wendee Beavers T, MD   40 mg at 07/22/19 1024  . sodium chloride flush (NS) 0.9 % injection 10-40 mL  10-40 mL Intracatheter Q12H Gonfa, Taye T, MD   10 mL at 07/22/19 1026  . sodium chloride flush (NS) 0.9 % injection 10-40 mL  10-40 mL Intracatheter PRN Gonfa, Taye T, MD      . tacrolimus (PROGRAF) 1 mg/mL oral suspension 1 mg  1 mg Oral QPM Wendee Beavers T, MD   1 mg at 07/21/19 1843  . tacrolimus (PROGRAF) 1 mg/mL oral suspension 2 mg  2 mg Oral q morning - 10a Cyndia Skeeters, Taye T, MD   2 mg at 07/22/19 1026  . thiamine (VITAMIN B-1) tablet 100 mg  100 mg Oral Daily Wendee Beavers T, MD   100 mg at 07/22/19 1024  . traZODone (DESYREL) tablet 100 mg  100 mg Oral QHS Wendee Beavers T, MD   100 mg at 07/21/19 2302     Discharge Medications: Please see discharge summary for a list of discharge medications.  Relevant Imaging Results:  Relevant Lab Results:   Additional Information SS# [id number]  Wende Neighbors, LCSW

## 2019-07-22 NOTE — TOC Initial Note (Addendum)
Transition of Care [facility]) - Initial/Assessment Note    Patient Details  Name: Anna [name] MRN: [id number] Date of Birth: 1951/09/04  [facility] CM/SW Contact:    Althea Charon, LCSW Phone Number: 07/22/2019, 11:31 AM  Clinical Narrative:    CSW following patient for support and discharge needs. Patient is only orient to self and CSW is unable to do assessment. CSW attempted to contact daughter Darl Pikes twice and left voicemail for her. Per MDs note patients lives alone. CSW received consult to assist patient in discharging to SNF vs. LTACH. Looking at patients history it will be hard to place her in a SNF for short term rehab due to her current SA. CSW made a referral to [facility].  Update:  Patient was denied by [facility], they stated that patient is not appropriate for that level of care.     3:50pm :  CSW received phone call back from patients daughter Darl Pikes. Darl Pikes went over patients history with CSW. Stating that patient lives alone but she does help patient when she can. Daughter stated that she would like patient to go to a rehab facility and requested that we utilize patients VA benefits. Darl Pikes stated 'patient has gone to a rehab facility before in the past through the [place]". Darl Pikes is aware that if the [place] does not cover rehab stay, patient will need to go under her Medicare policy. Daughter aware that it might be difficult for patient to be placed into a rehab facility due to her SA history.   Expected Discharge Plan: Long Term Acute Care (LTAC) Barriers to Discharge: Continued Medical Work up, Other (comment), Active Substance Use - Placement(pt has SA history and will be hard to find next level of care)   Patient Goals and CMS Choice        Expected Discharge Plan and Services Expected Discharge Plan: Long Term Acute Care (LTAC)                                              Prior Living Arrangements/Services     Patient language and need for  interpreter reviewed:: Yes Do you feel safe going back to the place where you live?: No   pt lives alone  Need for Family Participation in Patient Care: Yes (Comment) Care giver support system in place?: Yes (comment)   Criminal Activity/Legal Involvement Pertinent to Current Situation/Hospitalization: No - Comment as needed  Activities of Daily Living      Permission Sought/Granted Permission sought to share information with : Family Supports Permission granted to share information with : Yes, Verbal Permission Granted  Share Information with NAME: Dia Crawford     Permission granted to share info w Relationship: daughter  Permission granted to share info w Contact Information: [phone]  Emotional Assessment Appearance:: Appears stated age Attitude/Demeanor/Rapport: Unable to Assess(pt only orient to self) Affect (typically observed): Unable to Assess Orientation: : Oriented to Self Alcohol / Substance Use: Not Applicable Psych Involvement: No (comment)  Admission diagnosis:  Hypernatremia [E87.0] Renal insufficiency [N28.9] SIRS (systemic inflammatory response syndrome) (HCC) [R65.10] Hypothermia, initial encounter [T68.XXXA] Altered mental status, unspecified altered mental status type [R41.82] Acute encephalopathy [G93.40] Patient Active Problem List   Diagnosis Date Noted  . Pressure injury of skin 07/18/2019  . Hypernatremia   . Hypothermia   . SIRS (systemic inflammatory response syndrome) (HCC)   .  Altered mental status   . Renal insufficiency   . Hypotension   . Acute hypoxemic respiratory failure ([place])   . Acute metabolic encephalopathy 73/40/3709  . Acute encephalopathy 10/10/2017  . Hematemesis 10/10/2017  . Atrial fibrillation ([place]) 10/10/2017   PCP:  Patient, No Pcp Per Pharmacy:   [place], [address] [place] [address] Phone: [phone] Fax: [phone]     Social Determinants of  Health (SDOH) Interventions    Readmission Risk Interventions No flowsheet data found.

## 2019-07-22 NOTE — Progress Notes (Signed)
Called updated patient's daughter, Darrelyn Hillock over the phone.  We also discussed about CODE STATUS.  [name] stated that, she was emergently intubated in ED when her blood pressure dropped on admission but her mother's wish was DNR and DNI.

## 2019-07-22 NOTE — Progress Notes (Signed)
PROGRESS NOTE  Anna [name] ZOX:[id number] DOB: 02-Jan-1951   PCP: Patient, No Pcp Per  Patient is from: Home.  Per patient's daughter, lives alone in an apartment, independent for most ADL S, ambulates by holding to furniture's and fairly oriented.  Does not have walker or cane.  DOA: 07/12/2019 LOS: 10  Brief Narrative / Interim history: 68 year old female with history of asthma, HTN, renal transplant on immuno modulators and alcohol use presented 07/12/2019 after found down and minimally responsive at home.  It was noted that she had Tylenol pills and empty alcohol bottles around her when EMS found her.  She was hypoxic to 60s and hypotensive with SBP to 80s per EMS.  In ED, head CT and cervical spine were negative.  EtOH, Tylenol, ASA levels are normal.  She was noted to be in A. fib with RVR in low 100s.  She was also encephalopathic.  PCCM consulted 11/6 and patient was intubated.   In ICU, patient was treated for acute encephalopathy thought to be due to alcohol withdrawal, respiratory failure with hypercarbia and hypoxia and A. fib with RVR.  Infectious work-up including blood culture, urine culture, sputum culture and COVID-19 negative. Patient received empiric antibiotics from 11/5-11/10. Echo on 11/6 with EF of 55 to 60%, indeterminate DD and RVSP to 46 mmHg.  She was extubated to nasal cannula 11/9.  TRH assumed care 07/19/2019.  Started on Lasix for fluid overload.  Subjective: No major events overnight of this morning.  No complaint this morning.  She denies chest pain, dyspnea, nausea, vomiting or abdominal pain.  She might not be a reliable historian.  She is only oriented to self and year today.   Objective: Vitals:   07/21/19 1120 07/21/19 1254 07/21/19 2028 07/22/19 0410  BP:  (!) 129/51 (!) 94/54 (!) 108/56  Pulse:  (!) 110 70 (!) 59  Resp:  Temp: 97.9 F (36.6 C) 97.7 F (36.5 C) 98 F (36.7 C) 98.2 F (36.8 C)  TempSrc: Axillary Axillary Axillary  Oral  SpO2:  98% 96% 97%  Weight:    87.8 kg  Height:        Intake/Output Summary (Last 24 hours) at 07/22/2019 1114 Last data filed at 07/22/2019 1026 Gross per 24 hour  Intake 70 ml  Output 1876 ml  Net -1806 ml   Filed Weights   07/20/19 0432 07/21/19 0500 07/22/19 0410  Weight: 88.6 kg 89.6 kg 87.8 kg    Examination:  GENERAL: No acute distress.  Appears well.  HEENT: Somewhat dry oral mucosa.  Vision and hearing grossly intact.  NECK: Supple.  No apparent JVD.  RESP:  No IWOB.  Fair air movement bilaterally.  Bilateral rales. CVS: Irregular.  Normal rate. Heart sounds normal.  ABD/GI/GU: Bowel sounds present. Soft. Non tender.  MSK/EXT: Bilateral lower extremity weakness.  1+ pitting edema in all extremities. SKIN: First to second-degree skin wound over her backside as below NEURO: Awake, alert but oriented only to self and year..  No apparent focal neuro deficit other than bilateral lower extremity weakness. PSYCH: Calm. Normal affect.  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  UDS negative.  Thyroid panel normal.  CT head without acute finding.  Should be outside withdrawal window now.  No obvious source of infection but received empiric antibiotics 11/6-11/10.  Encephalopathy improving. -Continue supportive care, multivitamins and thiamine's. -Treat treatable causes -Frequent reorientation and  delirium precautions  Acute respiratory failure with hypoxia and hypercarbia: Likely due to encephalopathy and alcohol intoxication.  Has history of asthma but only takes as needed albuterol. -ETT 11/6-11/9 -Wean oxygen as able-currently saturating 100% on 1 L. -Continue as needed breathing treatments -Aspiration precaution  Acute diastolic CHF/fluid overload: Echo on 11/6 with EF of 55 to 60%, indeterminate DD and moderate PASP to 46.  She has  dependent edema and about 38lbs weight gain since admission.  Saturating at 100% on 1 L.  BNP 220.  CXR with pulmonary vascular congestion.  About 2 L UOP in the last 24 hours.  Renal function and electrolytes stable. -IV Lasix 20 mg daily -Monitor fluid status, renal function and electrolytes -Optimize nutrition  Paroxysmal A. Fib/sinus pauses: CHA2DS2-VASc score> 3.  Echo as above.  Discontinued nodal blocking agent due to frequent sinus pauses.  Rate controlled without medication -Closely monitor electrolytes and replenish aggressively -Continue Eliquis for anticoagulation  Hypotension: Normotensive -Continue monitoring  Moderate pulmonary hypertension: Echo revealed RVSP to 46 mmHg. -Diuretics as above.  History of renal transplant on CellCept and Prograf: Renal function is stable.  Fair urine output. -Continue home CellCept and Prograf  Post intubation dysphagia -Appreciate guidance by SLP and dietitian-currently on full liquid diet  Hypoglycemia: Resolved.  Likely due to poor p.o. intake.  Cortisol level within normal range. -Continue monitoring  Anemia of chronic disease: Anemia panel consistent with this. Baseline 12-13.   No obvious bleeding. -Hgb 18.1 (admit)>11.2 (next day)>>> 8.8>9.2 -Continue monitoring.  Thrombocytopenia: Likely due to alcohol and acute illness.  Resolved. -Continue monitoring  History of mood disorder -Continue home Wellbutrin -Reduce on trazodone to 100 mg daily.  Acute urinary retention: Had 3 I&O caths. -Start Urecholine 25 mg 3 times daily -Foley catheter inserted 11/14.  Deconditioning/debility/generalized weakness: Far from baseline.  See above. -PT/OT-recommended SNF.  Pressure Injury 07/12/19 Buttocks Right Deep Tissue Injury - Purple or maroon localized area of discolored intact skin or blood-filled blister due to damage of underlying soft tissue from pressure and/or shear. Blister with three stripes and purple disco (Active)   07/12/19   Location: Buttocks  Location Orientation: Right  Staging: Deep Tissue Injury - Purple or maroon localized area of discolored intact skin or blood-filled blister due to damage of underlying soft tissue from pressure and/or shear.  Wound Description (Comments): Blister with three stripes and purple discoloration  Present on Admission: Yes     Pressure Injury 07/12/19 Ischial tuberosity Lateral;Right Deep Tissue Injury - Purple or maroon localized area of discolored intact skin or blood-filled blister due to damage of underlying soft tissue from pressure and/or shear. open blister with purple  (Active)  07/12/19   Location: Ischial tuberosity  Location Orientation: Lateral;Right  Staging: Deep Tissue Injury - Purple or maroon localized area of discolored intact skin or blood-filled blister due to damage of underlying soft tissue from pressure and/or shear.  Wound Description (Comments): open blister with purple discolaration.  Present on Admission: Yes     Nutrition Problem: Inadequate oral intake Etiology: inability to eat  Signs/Symptoms: NPO status  Interventions: Boost Breeze, Refer to RD note for recommendations   DVT prophylaxis: On heparin drip for A. fib Code Status: Partial code-NIPPV/intubation/MV only Family Communication: Updated patient's daughter over the phone 11/12.  Will update today. Disposition Plan: Remains inpatient due to acute diastolic CHF/fluid overload/deconditioning and debility.  Consultants: PCCM  Procedures:  ETT 11/6-11/9  Microbiology summarized: 11/6-COVID-19 negative 11/6-MRSA PCR negative 11/5-blood cultures negative so far  11/5-urine cultures negative so far   Sch Meds:  Scheduled Meds:  apixaban  5 mg Oral BID   bethanechol  25 mg Oral TID   buPROPion  300 mg Oral Daily   Chlorhexidine Gluconate Cloth  6 each Topical Daily   feeding supplement  1 Container Oral TID BM   folic acid  1 mg Oral Daily   furosemide  20  mg Intravenous Daily   gabapentin  100 mg Oral TID   Gerhardt's butt cream   Topical TID   insulin aspart  2-6 Units Subcutaneous Q4H   mouth rinse  15 mL Mouth Rinse BID   mycophenolate  500 mg Oral BID   nystatin cream   Topical BID   pantoprazole  40 mg Oral Daily   sodium chloride flush  10-40 mL Intracatheter Q12H   tacrolimus  1 mg Oral QPM   tacrolimus  2 mg Oral q morning - 10a   thiamine  100 mg Oral Daily   traZODone  100 mg Oral QHS   Continuous Infusions:  PRN Meds:.acetaminophen, [DISCONTINUED] ondansetron **OR** ondansetron (ZOFRAN) IV, sodium chloride flush  Antimicrobials: Anti-infectives (From admission, onward)   Start     Dose/Rate Route Frequency Ordered Stop   07/14/19 2200  vancomycin (VANCOCIN) 1,500 mg in sodium chloride 0.9 % 500 mL IVPB  Status:  Discontinued     1,500 mg 250 mL/hr over 120 Minutes Intravenous Every 48 hours 07/12/19 2229 07/13/19 1325   07/14/19 2200  vancomycin (VANCOCIN) 1,500 mg in sodium chloride 0.9 % 500 mL IVPB  Status:  Discontinued     1,500 mg 250 mL/hr over 120 Minutes Intravenous Every 48 hours 07/14/19 0915 07/14/19 1151   07/14/19 1130  aztreonam (AZACTAM) 2 g in sodium chloride 0.9 % 100 mL IVPB  Status:  Discontinued     2 g 200 mL/hr over 30 Minutes Intravenous Every 8 hours 07/13/19 1504 07/17/19 0812   07/13/19 1100  levofloxacin (LEVAQUIN) IVPB 750 mg  Status:  Discontinued     750 mg 100 mL/hr over 90 Minutes Intravenous Every 24 hours 07/13/19 1053 07/13/19 1055   07/13/19 1100  levofloxacin (LEVAQUIN) IVPB 500 mg  Status:  Discontinued     500 mg 100 mL/hr over 60 Minutes Intravenous Every 24 hours 07/13/19 1055 07/13/19 1319   07/12/19 2200  vancomycin (VANCOCIN) 1,500 mg in sodium chloride 0.9 % 500 mL IVPB     1,500 mg 250 mL/hr over 120 Minutes Intravenous  Once 07/12/19 2134 07/13/19 0224   07/12/19 2145  levofloxacin (LEVAQUIN) IVPB 750 mg  Status:  Discontinued     750 mg 100 mL/hr over 90  Minutes Intravenous  Once 07/12/19 2134 07/13/19 0900       I have personally reviewed the following labs and images: CBC: Recent Labs  Lab 07/18/19 0425 07/19/19 0439 07/20/19 0431 07/21/19 0822 07/22/19 0507  WBC 11.2* 7.9 6.1 6.0 4.0  HGB 9.9* 8.8* 9.2* 9.9* 9.2*  HCT 30.8* 27.6* 28.5* 32.5* 29.4*  MCV 107.3* 109.1* 109.6* 113.2* 109.3*  PLT 110* 115* 138* 170 193   BMP &GFR Recent Labs  Lab 07/17/19 0355 07/18/19 0425 07/18/19 0917 07/19/19 0439 07/20/19 0431 07/21/19 0822 07/22/19 0507  NA 141  --  141 138 139 138 138  K 3.6  --  4.2 4.8 4.3 4.5 3.9  CL 116*  --  117* 111 111 110 109  CO2 19*  --  21* 23 23 23 24   GLUCOSE 148*  --  131* 87 129* 96 143*  BUN 28*  --  18 15 11 9 8   CREATININE 0.60  --  0.46 0.47 0.41* 0.53 0.55  CALCIUM 8.7*  --  8.3* 8.5* 8.9 9.1 9.1  MG 2.1  --   --  1.6* 2.0 1.7 1.7  PHOS 2.4* 2.0*  --   --  3.1 3.3 3.6   Estimated Creatinine Clearance: 72.1 mL/min (by C-G formula based on SCr of 0.55 mg/dL). Liver & Pancreas: Recent Labs  Lab 07/16/19 0500 07/18/19 0917 07/19/19 0439 07/21/19 0822  AST 18 13* 18  --   ALT 21 20 22   --   ALKPHOS 48 50 47  --   BILITOT 0.9 1.1 0.9  --   PROT 4.5* 4.3* 4.1*  --   ALBUMIN 2.0* 1.8* 1.8* 2.1*   No results for input(s): LIPASE, AMYLASE in the last 168 hours. No results for input(s): AMMONIA in the last 168 hours. Diabetic: No results for input(s): HGBA1C in the last 72 hours. Recent Labs  Lab 07/21/19 1550 07/21/19 2036 07/21/19 2350 07/22/19 0406 07/22/19 0741  GLUCAP 84 133* 118* 140* 93   Cardiac Enzymes: Recent Labs  Lab 07/21/19 0822  CKTOTAL 17*   No results for input(s): PROBNP in the last 8760 hours. Coagulation Profile: No results for input(s): INR, PROTIME in the last 168 hours. Thyroid Function Tests: No results for input(s): TSH, T4TOTAL, FREET4, T3FREE, THYROIDAB in the last 72 hours. Lipid Profile: No results for input(s): CHOL, HDL, LDLCALC, TRIG,  CHOLHDL, LDLDIRECT in the last 72 hours. Anemia Panel: Recent Labs    07/20/19 0758  VITAMINB12 814  FOLATE 11.6  FERRITIN 2,766*  TIBC 113*  IRON 33  RETICCTPCT 7.4*   Urine analysis:    Component Value Date/Time   COLORURINE AMBER (A) 07/12/2019 2200   APPEARANCEUR TURBID (A) 07/12/2019 2200   LABSPEC 1.025 07/12/2019 2200   PHURINE 5.0 07/12/2019 2200   GLUCOSEU 50 (A) 07/12/2019 2200   HGBUR NEGATIVE 07/12/2019 2200   BILIRUBINUR SMALL (A) 07/12/2019 2200   KETONESUR NEGATIVE 07/12/2019 2200   PROTEINUR 30 (A) 07/12/2019 2200   NITRITE NEGATIVE 07/12/2019 2200   LEUKOCYTESUR NEGATIVE 07/12/2019 2200   Sepsis Labs: Invalid input(s): PROCALCITONIN, [place]  Microbiology: Recent Results (from the past 240 hour(s))  Urine culture     Status: None   Collection Time: 07/12/19 10:00 PM   Specimen: Urine, Catheterized  Result Value Ref Range Status   Specimen Description URINE, CATHETERIZED  Final   Special Requests NONE  Final   Culture   Final    NO GROWTH Performed at [facility], [address] [place], [place] [address]    Report Status 07/14/2019 FINAL  Final  Blood culture (routine x 2)     Status: None   Collection Time: 07/12/19 10:03 PM   Specimen: BLOOD RIGHT FOREARM  Result Value Ref Range Status   Specimen Description BLOOD RIGHT FOREARM  Final   Special Requests   Final    BOTTLES DRAWN AEROBIC ONLY Blood Culture adequate volume   Culture   Final    NO GROWTH 5 DAYS Performed at [facility], [place] [address] [place],  [address]    Report Status 07/17/2019 FINAL  Final  SARS CORONAVIRUS 2 (TAT 6-24 HRS) Nasopharyngeal Nasopharyngeal Swab     Status: None   Collection Time: 07/13/19 12:48 AM   Specimen: Nasopharyngeal Swab  Result Value Ref Range Status   SARS Coronavirus  2 NEGATIVE NEGATIVE Final    Comment: (NOTE) SARS-CoV-2 target nucleic acids are NOT DETECTED. The SARS-CoV-2 RNA is generally detectable in upper and  lower respiratory specimens during the acute phase of infection. Negative results do not preclude SARS-CoV-2 infection, do not rule out co-infections with other pathogens, and should not be used as the sole basis for treatment or other patient management decisions. Negative results must be combined with clinical observations, patient history, and epidemiological information. The expected result is Negative. Fact Sheet for Patients: HairSlick.nohttps://www.fda.gov/media/138098/download Fact Sheet for Healthcare Providers: quierodirigir.comhttps://www.fda.gov/media/138095/download This test is not yet approved or cleared by the [place] FDA and  has been authorized for detection and/or diagnosis of SARS-CoV-2 by FDA under an Emergency Use Authorization (EUA). This EUA will remain  in effect (meaning this test can be used) for the duration of the COVID-19 declaration under Section 56 4(b)(1) of the Act, 21 U.S.C. section 360bbb-3(b)(1), unless the authorization is terminated or revoked sooner. Performed at [facility], [address] [place], [place] [address]   MRSA PCR Screening     Status: None   Collection Time: 07/13/19  8:57 PM   Specimen: Nasopharyngeal  Result Value Ref Range Status   MRSA by PCR NEGATIVE NEGATIVE Final    Comment:        The GeneXpert MRSA Assay (FDA approved for NASAL specimens only), is one component of a comprehensive MRSA colonization surveillance program. It is not intended to diagnose MRSA infection nor to guide or monitor treatment for MRSA infections. Performed at [facility], [address] [place], [place] [address]     Radiology Studies: No results found.  Audianna Landgren T. Lucille Crichlow [facility]  If 7PM-7AM, please contact night-coverage [email or web address] Password [email or web address] 07/22/2019, 11:14 AM

## 2019-07-22 NOTE — Progress Notes (Signed)
PHARMACY NOTE -  Eliquis  Pharmacy has been assisting with dosing of Eliquis for Afib.  Dosage remains stable at 5 mg PO bid and need for further dosage adjustment appears unlikely at present given stable SCr  Pharmacy will sign off, following peripherally for culture results or dose adjustments. Please reconsult if a change in clinical status warrants re-evaluation of dosage.  Reuel Boom, PharmD, BCPS [phone] 07/22/2019, 10:07 AM

## 2019-07-23 DIAGNOSIS — D61818 Other pancytopenia: Secondary | ICD-10-CM

## 2019-07-23 DIAGNOSIS — Z940 Kidney transplant status: Secondary | ICD-10-CM

## 2019-07-23 DIAGNOSIS — L89302 Pressure ulcer of unspecified buttock, stage 2: Secondary | ICD-10-CM

## 2019-07-23 LAB — PHOSPHORUS: Phosphorus: 3.3 mg/dL (ref 2.5–4.6)

## 2019-07-23 LAB — GLUCOSE, CAPILLARY
Glucose-Capillary: 110 mg/dL — ABNORMAL HIGH (ref 70–99)
Glucose-Capillary: 116 mg/dL — ABNORMAL HIGH (ref 70–99)
Glucose-Capillary: 118 mg/dL — ABNORMAL HIGH (ref 70–99)
Glucose-Capillary: 127 mg/dL — ABNORMAL HIGH (ref 70–99)
Glucose-Capillary: 91 mg/dL (ref 70–99)
Glucose-Capillary: 98 mg/dL (ref 70–99)

## 2019-07-23 LAB — BASIC METABOLIC PANEL
Anion gap: 6 (ref 5–15)
BUN: 6 mg/dL — ABNORMAL LOW (ref 8–23)
CO2: 25 mmol/L (ref 22–32)
Calcium: 8.7 mg/dL — ABNORMAL LOW (ref 8.9–10.3)
Chloride: 109 mmol/L (ref 98–111)
Creatinine, Ser: 0.58 mg/dL (ref 0.44–1.00)
GFR calc Af Amer: >60 mL/min (ref >60)
GFR calc non Af Amer: >60 mL/min (ref >60)
Glucose, Bld: 132 mg/dL — ABNORMAL HIGH (ref 70–99)
Potassium: 3.7 mmol/L (ref 3.5–5.1)
Sodium: 140 mmol/L (ref 135–145)

## 2019-07-23 LAB — CBC
HCT: 27.1 % — ABNORMAL LOW (ref 36.0–46.0)
Hemoglobin: 8.3 g/dL — ABNORMAL LOW (ref 12.0–15.0)
MCH: 34 pg (ref 26.0–34.0)
MCHC: 30.6 g/dL (ref 30.0–36.0)
MCV: 111.1 fL — ABNORMAL HIGH (ref 80.0–100.0)
Platelets: 202 10*3/uL (ref 150–400)
RBC: 2.44 MIL/uL — ABNORMAL LOW (ref 3.87–5.11)
RDW: 15.5 % (ref 11.5–15.5)
WBC: 2.9 10*3/uL — ABNORMAL LOW (ref 4.0–10.5)
nRBC: 0 % (ref 0.0–0.2)

## 2019-07-23 LAB — MAGNESIUM: Magnesium: 1.5 mg/dL — ABNORMAL LOW (ref 1.7–2.4)

## 2019-07-23 MED ORDER — INSULIN ASPART 100 UNIT/ML ~~LOC~~ SOLN
2.0000 [IU] | SUBCUTANEOUS | Status: DC
  Administered 2019-07-23: 2 [IU] via SUBCUTANEOUS

## 2019-07-23 MED ORDER — ENSURE ENLIVE PO LIQD
237.0000 mL | Freq: Two times a day (BID) | ORAL | Status: DC
  Administered 2019-07-23 – 2019-07-25 (×3): 237 mL via ORAL

## 2019-07-23 NOTE — Plan of Care (Signed)
Pt drank a Breeze this shift and tolerated her meds crushed in apple sauce well. Pt was calm and cooperative during care. She had a BM this shift and has had good urine OP. Pt C/O mild foot pain. Pt has scheduled Gabapentin. Pt was able to give me her 1st name only and her BD. She states she believes she is in [place]. Pt reoriented and this nurse will continue to monitor.

## 2019-07-23 NOTE — Progress Notes (Signed)
  Speech Language Pathology Treatment: Dysphagia  Patient Details Name: Anna [name] MRN: [id number] DOB: 13-Mar-1951 Today's Date: 07/23/2019 Time: 3149-7026 SLP Time Calculation (min) (ACUTE ONLY): 35 min  Assessment / Plan / Recommendation Clinical Impression  Pt seen at bedside to assess diet tolerance and readiness to advance. Per RN, pt continues to receive full liquid diet/thin liquids. Cough is reported intermittently, and pt has exhibited poor appetite. Meds tolerated whole in puree per RN. Pt accepted trials of Dys 2 (finely chopped) solids and thin liquids. Intermittent slight cough noted, however, pt reports no difficulty swallowing. Will advance diet to dys 2 and thin liquids, continue meds in puree, full supervision with assist for feeding. Safe swallow precautions advanced at [facility]. RN and MD informed of recommendation to advance solids. SLP will continue to follow to assess diet tolerance and determine if instrumental assessment is warranted.     HPI HPI: 68 yo female adm to [facility] 07/12/2019 after being found down at home with wine bottles around her.  She reported to MD that she drinks 6 1.5 liters of wine a week.  She requiring intubation  11/6-11/02/2019.  Pt head CT negative, CXR stable. H/O renal transplant, ETOH use, HTN, asthma.  WBC 11.2 and HR has been elevated at times.  Pt also with h/o admit with hematemesis in 10/2017.      SLP Plan  Continue with current plan of care       Recommendations  Diet recommendations: Dysphagia 2 (fine chop);Thin liquid Liquids provided via: Cup;Straw Medication Administration: Whole meds with puree Supervision: Staff to assist with self feeding;Full supervision/cueing for compensatory strategies Compensations: Slow rate;Small sips/bites;Minimize environmental distractions;Follow solids with liquid(begin meal with warm liquid) Postural Changes and/or Swallow Maneuvers: Seated upright 90 degrees;Upright 30-60 min after meal                 Oral Care Recommendations: Oral care BID Follow up Recommendations: Skilled Nursing facility;24 hour supervision/assistance SLP Visit Diagnosis: Dysphagia, unspecified (R13.10);Dysphagia, oral phase (R13.11) Plan: Continue with current plan of care       Enriqueta Shutter, [facility], [place] Pathologist Office: [phone] Pager: [phone]  Shonna Chock 07/23/2019, 3:46 PM

## 2019-07-23 NOTE — Progress Notes (Signed)
Physical Therapy Treatment Patient Details Name: Anna [name] MRN: [id number] DOB: 09-22-1950 Today's Date: 07/23/2019    History of Present Illness Pt is24 y.o. female, with history of HTN, asthma, and polysubstance abuse.  Pt was found minmally responsive in her home with pills and empty alcohol bottles around her.  She admitted with metabolic encephalopathy, polysubstance abuse, hypothermia.  Pt was intubated on 07/13/19 and extubated 07/16/19. Has had afib with RVR.    PT Comments    Pt in bed on 1 lt nasal General Comments: AxO x 2 groggy, delayed, sluggish.  Unaware of current location and situation requiring repeat functional commands.  General bed mobility comments: Pt required Total Assist + 2 for side to side rolling incont small, formed BM.  pt required Total Assist to transfer to EOB.  Unable to support self, required Total Assist to prevent LOB all planes.  Pt unable to hiold head in midline.  Max forward flex, collapsed posture and severe R head tilt.  General transfer comment: unable to attempt sit to stand even with + 3 assist. Once midline pt was unable to achieve self upright posture.  Present with collapsed trunk and forward flex neck. Used MAXI MOVE to assist to recliner and used multiple pillows to position to upright.  Pt unable to hold a cup to drink present with tremors and weakness.   Follow Up Recommendations  SNF     Equipment Recommendations  None recommended by PT    Recommendations for Other Services       Precautions / Restrictions Precautions Precautions: Fall Precaution Comments: SIRS Restrictions Weight Bearing Restrictions: No    Mobility  Bed Mobility Overal bed mobility: Needs Assistance Bed Mobility: Rolling;Sidelying to Sit;Sit to Sidelying Rolling: +2 for physical assistance;+2 for safety/equipment;Total assist Sidelying to sit: Total assist;+2 for physical assistance;+2 for safety/equipment       General bed mobility comments: Pt  required Total Assist + 2 for side to side rolling incont small, formed BM.  pt required Total Assist to transfer to EOB.  Unable to support self, required Total Assist to prevent LOB all planes.  Pt unable to hiold head in midline.  Max forward flex, collapsed posture and severe R head tilt )  Transfers                 General transfer comment: unable to attempt sit to stand even with + 3 assist. Once midline pt was unable to achieve self upright posture.  Present with collapsed trunk and forward flex neck.  Ambulation/Gait             General Gait Details: unable at this time to even attempt standing   Stairs             Wheelchair Mobility    Modified Rankin (Stroke Patients Only)       Balance                                            Cognition Arousal/Alertness: Awake/alert   Overall Cognitive Status: Impaired/Different from baseline                                 General Comments: AxO x 2 groggy, delayed, sluggish      Exercises      General Comments  Pertinent Vitals/Pain Pain Assessment: Faces Faces Pain Scale: Hurts little more Pain Location: general and neck Pain Descriptors / Indicators: Discomfort;Grimacing Pain Intervention(s): Monitored during session;Repositioned    Home Living                      Prior Function            PT Goals (current goals can now be found in the care plan section) Progress towards PT goals: Progressing toward goals    Frequency    Min 2X/week      PT Plan Current plan remains appropriate    Co-evaluation              AM-PAC PT "6 Clicks" Mobility   Outcome Measure  Help needed turning from your back to your side while in a flat bed without using bedrails?: Total Help needed moving from lying on your back to sitting on the side of a flat bed without using bedrails?: Total Help needed moving to and from a bed to a chair (including a  wheelchair)?: Total Help needed standing up from a chair using your arms (e.g., wheelchair or bedside chair)?: Total Help needed to walk in hospital room?: Total Help needed climbing 3-5 steps with a railing? : Total 6 Click Score: 6    End of Session Equipment Utilized During Treatment: Gait belt Activity Tolerance: Patient limited by fatigue;Treatment limited secondary to medical complications (Comment) Patient left: in chair;with call bell/phone within reach;with chair alarm set Nurse Communication: Need for lift equipment PT Visit Diagnosis: Muscle weakness (generalized) (M62.81);Difficulty in walking, not elsewhere classified (R26.2)     Time: 1410-1445 PT Time Calculation (min) (ACUTE ONLY): 35 min  Charges:  $Therapeutic Activity: 23-37 mins                     Felecia Shelling  PTA Acute  Rehabilitation Services Pager      [phone] Office      [phone]

## 2019-07-23 NOTE — TOC Progression Note (Signed)
Transition of Care [facility]) - Progression Note    Patient Details  Name: Anna [name] MRN: [id number] Date of Birth: 1951/06/11  Transition of Care [facility]) CM/SW Contact  Trong Gosling, Juliann Pulse, RN Phone Number: 07/23/2019, 3:23 PM  Clinical Narrative:patient has VA benefits-100% connected-[place] office-provider Dr. Jonnie Kind, sw Kellie Simmering, spoke to April alexander who will fax the ST rehab packet-await form must complete & get back to them by 10am tomorrow.       Expected Discharge Plan: [place] Barriers to Discharge: Continued Medical Work up  Expected Discharge Plan and Services Expected Discharge Plan: [place]   Discharge Planning Services: CM Consult Post Acute Care Choice: [place]                                         Social Determinants of Health (SDOH) Interventions    Readmission Risk Interventions No flowsheet data found.

## 2019-07-23 NOTE — Progress Notes (Signed)
PROGRESS NOTE  Anna [name] OZD:[id number] DOB: April 02, 1951   PCP: Patient, No Pcp Per  Patient is from: Home.  Per patient's daughter, lives alone in an apartment, independent for most ADL S, ambulates by holding to furniture's and fairly oriented.  Does not have walker or cane.  DOA: 07/12/2019 LOS: 11  Brief Narrative / Interim history: 68 year old female with history of asthma, HTN, renal transplant on immuno modulators and alcohol use presented 07/12/2019 after found down and minimally responsive at home.  It was noted that she had Tylenol pills and empty alcohol bottles around her when EMS found her.  She was hypoxic to 60s and hypotensive with SBP to 80s per EMS.  In ED, head CT and cervical spine were negative.  EtOH, Tylenol, ASA levels are normal.  She was noted to be in A. fib with RVR in low 100s.  She was also encephalopathic.  PCCM consulted 11/6 and patient was intubated.   In ICU, patient was treated for acute encephalopathy thought to be due to alcohol withdrawal, respiratory failure with hypercarbia and hypoxia and A. fib with RVR.  Infectious work-up including blood culture, urine culture, sputum culture and COVID-19 negative. Patient received empiric antibiotics from 11/5-11/10. Echo on 11/6 with EF of 55 to 60%, indeterminate DD and RVSP to 46 mmHg.  She was extubated to nasal cannula 11/9.  TRH assumed care 07/19/2019.  Started on Lasix for fluid overload with good response.  Patient continued to have some degree of confusion/disorientation which is likely going to be his baseline.   Subjective: No major events overnight of this morning.  No complaint this morning.  Denies chest pain, dyspnea, nausea, vomiting or abdominal pain.  She is not a great historian.  She is oriented to self and family name.  She thinks she is in [place].  Objective: Vitals:   07/22/19 1349 07/22/19 2100 07/23/19 0518 07/23/19 0900  BP: 105/65 106/66 (!) 89/50 (!) 88/60  Pulse: 73 92  93 90  Resp: Temp: 98.4 F (36.9 C) 98.5 F (36.9 C) 98.6 F (37 C) 98.1 F (36.7 C)  TempSrc: Oral Oral Oral Oral  SpO2: 97% 92% 99% 98%  Weight:   89.4 kg   Height:        Intake/Output Summary (Last 24 hours) at 07/23/2019 0940 Last data filed at 07/23/2019 0800 Gross per 24 hour  Intake 430 ml  Output 1950 ml  Net -1520 ml   Filed Weights   07/21/19 0500 07/22/19 0410 07/23/19 0518  Weight: 89.6 kg 87.8 kg 89.4 kg    Examination: GENERAL: No acute distress.  Lying in bed comfortably HEENT: MMM.  Vision and hearing grossly intact.  NECK: Supple.  No apparent JVD.  RESP:  No IWOB.  Fair air movement bilaterally.  No crackles or wheeze CVS: Irregular rhythm.  Normal rate. Heart sounds normal.  ABD/GI/GU: Bowel sounds present. Soft. Non tender.  Foley catheter in place. MSK/EXT: Bilateral lower extremity weakness.  Trace edema in all extremities. SKIN: First to second-degree skin wound over his backside as below. NEURO: Awake, alert but oriented only to self and family name.  Follows command.  No apparent focal neuro deficits PSYCH: Calm. Normal affect.   Assessment & Plan: Acute metabolic encephalopathy: EtOH abuse and polypharmacy at home.  UDS negative.  Thyroid panel normal.  CT head without acute finding.  No obvious source of infection but received empiric antibiotics 11/6-11/10.  Oriented to self and family name now  which is likely going to be at baseline.  -Continue supportive care, multivitamins and thiamine's. -Treat treatable causes -Frequent reorientation and delirium precautions  Acute respiratory failure with hypoxia and hypercarbia: Likely due to encephalopathy and alcohol intoxication.  Has history of asthma but only takes as needed albuterol. -ETT 11/6-11/9 -Wean to room air -Continue as needed breathing treatments -Diuretics as below -Aspiration precaution  Acute diastolic CHF/fluid overload: Echo on 11/6 with EF of 55 to 60%,  indeterminate DD and moderate PASP to 46.  She has dependent edema and about 38lbs weight gain since admission but the different scales.   BNP 220.  CXR with pulmonary vascular congestion.  UOP 2 L in the last 24 hours.  Renal function and electrolytes stable but soft blood pressures -Continue IV Lasix 20 mg daily -Monitor fluid status, renal function and electrolytes -Optimize nutrition-appreciate dietitian input  Paroxysmal A. Fib/sinus pauses: CHA2DS2-VASc score> 3.  Echo as above.  Discontinued nodal blocking agent due to frequent sinus pauses.  Rate controlled without medication -Closely monitor electrolytes and replenish aggressively -Continue Eliquis for anticoagulation  Hypotension: Cortisol level within normal range. -Continue monitoring -We will add low-dose midodrine if no improvement  Moderate pulmonary hypertension: Echo revealed RVSP to 46 mmHg. -Diuretics as above.  History of renal transplant on CellCept and Prograf: Renal function is stable.  Good urine output -Continue home CellCept and Prograf  Post intubation dysphagia -Appreciate guidance by SLP and dietitian-currently on full liquid diet  Hypoglycemia: Resolved.  Likely due to poor p.o. intake.  Cortisol level within normal range. -Continue monitoring  Pancytopenia: Likely from alcohol use.  Anemia panel consistent with ACD. Baseline 12-13.  Thrombocytopenia resolved.  Leukopenia new. -Hgb 12-13 (baseline) >18.1 (admit-hemoconcentration)>11.2 (next day)>>9.2> 8.3 -Continue monitoring. -Check Hemoccult  History of mood disorder -Continue home Wellbutrin -Reduced on trazodone to 100 mg daily.  Acute urinary retention: Had 3 I&O caths.  Placed Foley catheter 11/14 -Urecholine 25 mg 3 times daily -PR awake and voiding trial-we will reinsert Foley catheter if she fails  Deconditioning/debility/generalized weakness: Far from baseline.  See above. -PT/OT-recommended SNF.  Pressure Injury 07/12/19 Buttocks Right  Deep Tissue Injury - Purple or maroon localized area of discolored intact skin or blood-filled blister due to damage of underlying soft tissue from pressure and/or shear. Blister with three stripes and purple disco (Active)  07/12/19   Location: Buttocks  Location Orientation: Right  Staging: Deep Tissue Injury - Purple or maroon localized area of discolored intact skin or blood-filled blister due to damage of underlying soft tissue from pressure and/or shear.  Wound Description (Comments): Blister with three stripes and purple discoloration  Present on Admission: Yes     Pressure Injury 07/12/19 Ischial tuberosity Lateral;Right Deep Tissue Injury - Purple or maroon localized area of discolored intact skin or blood-filled blister due to damage of underlying soft tissue from pressure and/or shear. open blister with purple  (Active)  07/12/19   Location: Ischial tuberosity  Location Orientation: Lateral;Right  Staging: Deep Tissue Injury - Purple or maroon localized area of discolored intact skin or blood-filled blister due to damage of underlying soft tissue from pressure and/or shear.  Wound Description (Comments): open blister with purple discolaration.  Present on Admission: Yes     Nutrition Problem: Inadequate oral intake Etiology: inability to eat  Signs/Symptoms: NPO status  Interventions: Boost Breeze, Refer to RD note for recommendations   DVT prophylaxis: On Eliquis for A. fib Code Status: DNR/DNI Family Communication: Updated patient's daughter over the phone  11/15 Disposition Plan: Remains inpatient.  Final disposition LTAC or SNF  Consultants: None  Procedures:  ETT 11/6-11/9  Microbiology summarized: 11/6-COVID-19 negative 11/6-MRSA PCR negative 11/5-blood cultures negative so far 11/5-urine cultures negative so far   Sch Meds:  Scheduled Meds: . apixaban  5 mg Oral BID  . bethanechol  25 mg Oral TID  . buPROPion  300 mg Oral Daily  . Chlorhexidine  Gluconate Cloth  6 each Topical Daily  . feeding supplement (ENSURE ENLIVE)  237 mL Oral BID BM  . folic acid  1 mg Oral Daily  . furosemide  20 mg Intravenous Daily  . gabapentin  100 mg Oral TID  . Gerhardt's butt cream   Topical TID  . influenza vaccine adjuvanted  0.5 mL Intramuscular Tomorrow-1000  . insulin aspart  2-6 Units Subcutaneous Q4H  . mouth rinse  15 mL Mouth Rinse BID  . mycophenolate  500 mg Oral BID  . nystatin cream   Topical BID  . pantoprazole  40 mg Oral Daily  . pneumococcal 23 valent vaccine  0.5 mL Intramuscular Tomorrow-1000  . sodium chloride flush  10-40 mL Intracatheter Q12H  . tacrolimus  1 mg Oral QPM  . tacrolimus  2 mg Oral q morning - 10a  . thiamine  100 mg Oral Daily  . traZODone  100 mg Oral QHS   Continuous Infusions:  PRN Meds:.acetaminophen, [DISCONTINUED] ondansetron **OR** ondansetron (ZOFRAN) IV, sodium chloride flush  Antimicrobials: Anti-infectives (From admission, onward)   Start     Dose/Rate Route Frequency Ordered Stop   07/14/19 2200  vancomycin (VANCOCIN) 1,500 mg in sodium chloride 0.9 % 500 mL IVPB  Status:  Discontinued     1,500 mg 250 mL/hr over 120 Minutes Intravenous Every 48 hours 07/12/19 2229 07/13/19 1325   07/14/19 2200  vancomycin (VANCOCIN) 1,500 mg in sodium chloride 0.9 % 500 mL IVPB  Status:  Discontinued     1,500 mg 250 mL/hr over 120 Minutes Intravenous Every 48 hours 07/14/19 0915 07/14/19 1151   07/14/19 1130  aztreonam (AZACTAM) 2 g in sodium chloride 0.9 % 100 mL IVPB  Status:  Discontinued     2 g 200 mL/hr over 30 Minutes Intravenous Every 8 hours 07/13/19 1504 07/17/19 0812   07/13/19 1100  levofloxacin (LEVAQUIN) IVPB 750 mg  Status:  Discontinued     750 mg 100 mL/hr over 90 Minutes Intravenous Every 24 hours 07/13/19 1053 07/13/19 1055   07/13/19 1100  levofloxacin (LEVAQUIN) IVPB 500 mg  Status:  Discontinued     500 mg 100 mL/hr over 60 Minutes Intravenous Every 24 hours 07/13/19 1055 07/13/19  1319   07/12/19 2200  vancomycin (VANCOCIN) 1,500 mg in sodium chloride 0.9 % 500 mL IVPB     1,500 mg 250 mL/hr over 120 Minutes Intravenous  Once 07/12/19 2134 07/13/19 0224   07/12/19 2145  levofloxacin (LEVAQUIN) IVPB 750 mg  Status:  Discontinued     750 mg 100 mL/hr over 90 Minutes Intravenous  Once 07/12/19 2134 07/13/19 0900       I have personally reviewed the following labs and images: CBC: Recent Labs  Lab 07/19/19 0439 07/20/19 0431 07/21/19 0822 07/22/19 0507 07/23/19 0400  WBC 7.9 6.1 6.0 4.0 2.9*  HGB 8.8* 9.2* 9.9* 9.2* 8.3*  HCT 27.6* 28.5* 32.5* 29.4* 27.1*  MCV 109.1* 109.6* 113.2* 109.3* 111.1*  PLT 115* 138* 170 193 202   BMP &GFR Recent Labs  Lab 07/18/19 0425  07/19/19 0439 07/20/19  [id number] 07/21/19 0822 07/22/19 0507 07/23/19 0400  NA  --    < > 138 139 138 138 140  K  --    < > 4.8 4.3 4.5 3.9 3.7  CL  --    < > 111 111 110 109 109  CO2  --    < > 23 23 23 24 25   GLUCOSE  --    < > 87 129* 96 143* 132*  BUN  --    < > 15 11 9 8  6*  CREATININE  --    < > 0.47 0.41* 0.53 0.55 0.58  CALCIUM  --    < > 8.5* 8.9 9.1 9.1 8.7*  MG  --   --  1.6* 2.0 1.7 1.7 1.5*  PHOS 2.0*  --   --  3.1 3.3 3.6 3.3   < > = values in this interval not displayed.   Estimated Creatinine Clearance: 72.9 mL/min (by C-G formula based on SCr of 0.58 mg/dL). Liver & Pancreas: Recent Labs  Lab 07/18/19 0917 07/19/19 0439 07/21/19 0822  AST 13* 18  --   ALT 20 22  --   ALKPHOS 50 47  --   BILITOT 1.1 0.9  --   PROT 4.3* 4.1*  --   ALBUMIN 1.8* 1.8* 2.1*   No results for input(s): LIPASE, AMYLASE in the last 168 hours. No results for input(s): AMMONIA in the last 168 hours. Diabetic: No results for input(s): HGBA1C in the last 72 hours. Recent Labs  Lab 07/22/19 1645 07/22/19 2102 07/23/19 0016 07/23/19 0515 07/23/19 0730  GLUCAP 81 119* 110* 116* 98   Cardiac Enzymes: Recent Labs  Lab 07/21/19 0822  CKTOTAL 17*   No results for input(s): PROBNP in  the last 8760 hours. Coagulation Profile: No results for input(s): INR, PROTIME in the last 168 hours. Thyroid Function Tests: No results for input(s): TSH, T4TOTAL, FREET4, T3FREE, THYROIDAB in the last 72 hours. Lipid Profile: No results for input(s): CHOL, HDL, LDLCALC, TRIG, CHOLHDL, LDLDIRECT in the last 72 hours. Anemia Panel: No results for input(s): VITAMINB12, FOLATE, FERRITIN, TIBC, IRON, RETICCTPCT in the last 72 hours. Urine analysis:    Component Value Date/Time   COLORURINE AMBER (A) 07/12/2019 2200   APPEARANCEUR TURBID (A) 07/12/2019 2200   LABSPEC 1.025 07/12/2019 2200   PHURINE 5.0 07/12/2019 2200   GLUCOSEU 50 (A) 07/12/2019 2200   HGBUR NEGATIVE 07/12/2019 2200   BILIRUBINUR SMALL (A) 07/12/2019 2200   KETONESUR NEGATIVE 07/12/2019 2200   PROTEINUR 30 (A) 07/12/2019 2200   NITRITE NEGATIVE 07/12/2019 2200   LEUKOCYTESUR NEGATIVE 07/12/2019 2200   Sepsis Labs: Invalid input(s): PROCALCITONIN, LACTICIDVEN  Microbiology: Recent Results (from the past 240 hour(s))  MRSA PCR Screening     Status: None   Collection Time: 07/13/19  8:57 PM   Specimen: Nasopharyngeal  Result Value Ref Range Status   MRSA by PCR NEGATIVE NEGATIVE Final    Comment:        The GeneXpert MRSA Assay (FDA approved for NASAL specimens only), is one component of a comprehensive MRSA colonization surveillance program. It is not intended to diagnose MRSA infection nor to guide or monitor treatment for MRSA infections. Performed at [facility], [address] [place], [place] [id number]     Radiology Studies: No results found.  Earlene Bjelland T. Julian Medina [facility]  If 7PM-7AM, please contact night-coverage [email or web address] Password [email or web address] 07/23/2019, 9:40 AM

## 2019-07-23 NOTE — Care Management Important Message (Signed)
Important Message  Patient Details IM Letter given to Dessa Phi RN to present to the Patient Name: Anna [name] MRN: [id number] Date of Birth: 08/10/1951   Medicare Important Message Given:  Yes     Kerin Salen 07/23/2019, 1:19 PM

## 2019-07-23 NOTE — TOC Progression Note (Signed)
Transition of Care [facility]) - Progression Note    Patient Details  Name: Anna [name] MRN: [id number] Date of Birth: May 30, 1951  Transition of Care [facility]) CM/SW Contact  Nerissa Constantin, Juliann Pulse, RN Phone Number: 07/23/2019, 10:51 AM  Clinical Narrative: Left message w/dtr [name] to f/u on SNF-awaiting bed offers.      Expected Discharge Plan: [place] Barriers to Discharge: Continued Medical Work up  Expected Discharge Plan and Services Expected Discharge Plan: [place]   Discharge Planning Services: CM Consult Post Acute Care Choice: [place]                                         Social Determinants of Health (SDOH) Interventions    Readmission Risk Interventions No flowsheet data found.

## 2019-07-23 NOTE — Discharge Instructions (Signed)

## 2019-07-23 NOTE — Progress Notes (Signed)
Occupational Therapy Treatment Patient Details Name: Anna [name] MRN: [id number] DOB: [birth year] Today's Date: 07/23/2019    History of present illness Pt is20 y.o. female, with history of HTN, asthma, and polysubstance abuse.  Pt was found minmally responsive in her home with pills and empty alcohol bottles around her.  She admitted with metabolic encephalopathy, polysubstance abuse, hypothermia.  Pt was intubated on 07/13/19 and extubated 07/16/19. Has had afib with RVR.   OT comments  Pt with participation with simple grooming task.   Follow Up Recommendations  SNF    Equipment Recommendations  (tba)    Recommendations for Other Services      Precautions / Restrictions Precautions Precautions: Fall Precaution Comments: SIRS Restrictions Weight Bearing Restrictions: No       Mobility Bed Mobility Overal bed mobility: Needs Assistance Bed Mobility: (total A for repositioning in bed) Rolling: +2 for physical assistance;+2 for safety/equipment;Total assist Sidelying to sit: Total assist;+2 for physical assistance;+2 for safety/equipment        Transfers                 General transfer comment: unable to attempt sit to stand even with + 3 assist. Once midline pt was unable to achieve self upright posture.  Present with collapsed trunk and forward flex neck.        ADL either performed or assessed with clinical judgement   ADL Overall ADL's : Needs assistance/impaired     Grooming: Wash/dry face;Moderate assistance;Bed level Grooming Details (indicate cue type and reason): hand over hand guideing.  Pt leaned over to leftright in bed with head turned toward the right.   Total A to reposition and then pt went right back to leaning right  in bed.                               General ADL Comments: pt did participate in simple grooming task.  Pt followed directions with OT but BUE fatigued quickly with washing face.  Total A to reposition in  the bed     Vision   Vision Assessment?: No apparent visual deficits          Cognition Arousal/Alertness: Awake/alert Behavior During Therapy: Flat affect Overall Cognitive Status: Impaired/Different from baseline                                 General Comments: AxO x 2 groggy, delayed, sluggish                   Pertinent Vitals/ Pain       Pain Assessment: Faces Pain Score: 4  Faces Pain Scale: Hurts little more Pain Location: neck pain Pain Descriptors / Indicators: Discomfort;Grimacing;Aching Pain Intervention(s): Monitored during session;Repositioned         Frequency  Min 2X/week        Progress Toward Goals  OT Goals(current goals can now be found in the care plan section)  Progress towards OT goals: Progressing toward goals     Plan Discharge plan remains appropriate       AM-PAC OT "6 Clicks" Daily Activity     Outcome Measure   Help from another person eating meals?: A Lot Help from another person taking care of personal grooming?: A Lot Help from another person toileting, which includes using toliet, bedpan, or urinal?: Total Help from another person bathing (including  washing, rinsing, drying)?: Total Help from another person to put on and taking off regular upper body clothing?: Total Help from another person to put on and taking off regular lower body clothing?: Total 6 Click Score: 8    End of Session    OT Visit Diagnosis: Muscle weakness (generalized) (M62.81)   Activity Tolerance Patient limited by fatigue   Patient Left in bed;with call bell/phone within reach;with bed alarm set   Nurse Communication          Time: 4010-2725 OT Time Calculation (min): 15 min  Charges: OT General Charges $OT Visit: 1 Visit OT Treatments $Self Care/Home Management : 8-22 mins  Anna [name], [name] [phone] Office- [name], Anna [name] 07/23/2019, 3:59  PM

## 2019-07-23 NOTE — Progress Notes (Signed)
Nutrition Follow-up  RD working remotely.  DOCUMENTATION CODES:   Not applicable  INTERVENTION:   - Ensure Enlive po BID, each supplement provides 350 kcal and 20 grams of protein  - Magic Cup TID with meals, each supplement provides 290 kcal and 9 grams of protein  - Provide feeding assistance as needed  - d/c Boost Breeze  NUTRITION DIAGNOSIS:   Inadequate oral intake related to inability to eat as evidenced by NPO status.  Progressing, pt now on Dysphagia 2 diet with thin liquids  GOAL:   Patient will meet greater than or equal to 90% of their needs  Progressing  MONITOR:   PO intake, Supplement acceptance, Diet advancement, Labs, Weight trends, I & O's, Skin  REASON FOR ASSESSMENT:   Consult, Ventilator Enteral/tube feeding initiation and management  ASSESSMENT:   68 y.o. female who has a PMH including but not limited to asthma, HTN, renal transplant, EtOH abuse. She presented to [facility] ED 11/5 after she was found in her home down and minimally responsive.  11/05 - admitted 11/06 - intubated with OGT 11/09 - extubated, NGT placed 11/11 - NGT removed, diet advanced to clear liquids 11/15 - full liquids 11/16 - Dysphagia 2 with thin liquids  Pt with some degree of confusion which MD believes will now be part of her baseline.  Noted plan for pt to d/c to SNF.  Weight up a total of 58 lbs since admit. Suspect weight gain related to positive fluid balance. EDW: 63.1 kg (admit weight). Per RN edema assessment, pt with moderate to deep pitting edema to BUE and non-pitting edema to BLE.  Given variable PO intake, RD will order oral nutrition supplements to aid pt in meeting kcal and protein needs. Will d/c Boost Breeze now that pt's diet has been advanced past clear liquids.  Meal Completion: 0-100% x last 8 meals (extremely variable)  Medications reviewed and include: Boost Breeze TID, folic acid, Lasix 20 mg daily, SSI q 4 hours, Protonix, thiamine  Labs  reviewed: magnesium 1.5, hemoglobin 8.3 CBG's: 81-119 x 24 hours  UOP: 1950 ml x 24 hours I/O's: +11.2 L since admit  Diet Order:   Diet Order            DIET DYS 2 Room service appropriate? Yes; Fluid consistency: Thin  Diet effective now              EDUCATION NEEDS:   No education needs have been identified at this time  Skin:  Skin Assessment: Skin Integrity Issues: DTI: Per WOC note: right posterior thoracic chest, right buttock, left buttock near anus, left posterior thigh  Last BM:  07/22/19 small type 6  Height:   Ht Readings from Last 1 Encounters:  07/12/19 5\' 4"  (1.626 m)    Weight:   Wt Readings from Last 1 Encounters:  07/23/19 89.4 kg    Ideal Body Weight:  54.5 kg  BMI:  Body mass index is 33.83 kg/m.  Estimated Nutritional Needs:   Kcal:  1700-1900  Protein:  90-110  Fluid:  1.7-1.9 L    Gaynell Face, MS, RD, LDN Inpatient Clinical Dietitian Pager: [phone] Weekend/After Hours: [phone]

## 2019-07-24 DIAGNOSIS — E876 Hypokalemia: Secondary | ICD-10-CM

## 2019-07-24 LAB — BASIC METABOLIC PANEL
Anion gap: 6 (ref 5–15)
BUN: 6 mg/dL — ABNORMAL LOW (ref 8–23)
CO2: 28 mmol/L (ref 22–32)
Calcium: 9.1 mg/dL (ref 8.9–10.3)
Chloride: 107 mmol/L (ref 98–111)
Creatinine, Ser: 0.58 mg/dL (ref 0.44–1.00)
GFR calc Af Amer: >60 mL/min (ref >60)
GFR calc non Af Amer: >60 mL/min (ref >60)
Glucose, Bld: 101 mg/dL — ABNORMAL HIGH (ref 70–99)
Potassium: 3.6 mmol/L (ref 3.5–5.1)
Sodium: 141 mmol/L (ref 135–145)

## 2019-07-24 LAB — GLUCOSE, CAPILLARY
Glucose-Capillary: 101 mg/dL — ABNORMAL HIGH (ref 70–99)
Glucose-Capillary: 103 mg/dL — ABNORMAL HIGH (ref 70–99)
Glucose-Capillary: 114 mg/dL — ABNORMAL HIGH (ref 70–99)
Glucose-Capillary: 119 mg/dL — ABNORMAL HIGH (ref 70–99)
Glucose-Capillary: 84 mg/dL (ref 70–99)
Glucose-Capillary: 87 mg/dL (ref 70–99)
Glucose-Capillary: 91 mg/dL (ref 70–99)

## 2019-07-24 LAB — PHOSPHORUS: Phosphorus: 3.3 mg/dL (ref 2.5–4.6)

## 2019-07-24 LAB — CBC
HCT: 30.2 % — ABNORMAL LOW (ref 36.0–46.0)
Hemoglobin: 9.6 g/dL — ABNORMAL LOW (ref 12.0–15.0)
MCH: 34.7 pg — ABNORMAL HIGH (ref 26.0–34.0)
MCHC: 31.8 g/dL (ref 30.0–36.0)
MCV: 109 fL — ABNORMAL HIGH (ref 80.0–100.0)
Platelets: 249 10*3/uL (ref 150–400)
RBC: 2.77 MIL/uL — ABNORMAL LOW (ref 3.87–5.11)
RDW: 15.4 % (ref 11.5–15.5)
WBC: 5.2 10*3/uL (ref 4.0–10.5)
nRBC: 0 % (ref 0.0–0.2)

## 2019-07-24 LAB — MAGNESIUM: Magnesium: 1.5 mg/dL — ABNORMAL LOW (ref 1.7–2.4)

## 2019-07-24 MED ORDER — POTASSIUM CHLORIDE CRYS ER 20 MEQ PO TBCR
40.0000 meq | EXTENDED_RELEASE_TABLET | Freq: Once | ORAL | Status: AC
  Administered 2019-07-24: 40 meq via ORAL
  Filled 2019-07-24: qty 2

## 2019-07-24 MED ORDER — MAGNESIUM SULFATE 2 GM/50ML IV SOLN
2.0000 g | Freq: Once | INTRAVENOUS | Status: AC
  Administered 2019-07-24: 2 g via INTRAVENOUS
  Filled 2019-07-24: qty 50

## 2019-07-24 NOTE — TOC Progression Note (Signed)
Transition of Care [facility]) - Progression Note    Patient Details  Name: Jessalyn Hinojosa MRN: [id number] Date of Birth: Jun 30, 1951  [facility]) CM/SW Contact  Elisa Kutner, Juliann Pulse, RN Phone Number: 07/24/2019, 11:31 AM  Clinical Narrative:  Accepted @ [place] rep Juliann Pulse following-bed available either Thursday/Friday. Will need covid ordered.MD aware.     Expected Discharge Plan: [place] Barriers to Discharge: Continued Medical Work up  Expected Discharge Plan and Services Expected Discharge Plan: [place]   Discharge Planning Services: CM Consult Post Acute Care Choice: [place]                                         Social Determinants of Health (SDOH) Interventions    Readmission Risk Interventions No flowsheet data found.

## 2019-07-24 NOTE — Progress Notes (Signed)
PROGRESS NOTE  Zarrah Loveland YQM:[id number] DOB: 1950/10/23   PCP: Patient, No Pcp Per  Patient is from: Home.  Per patient's daughter, lives alone in an apartment, independent for most ADL S, ambulates by holding to furniture's and fairly oriented.  Does not have walker or cane.  DOA: 07/12/2019 LOS: 12  Brief Narrative / Interim history: 68 year old female with history of asthma, HTN, renal transplant on immuno modulators and alcohol use presented 07/12/2019 after found down and minimally responsive at home.  It was noted that she had Tylenol pills and empty alcohol bottles around her when EMS found her.  She was hypoxic to 60s and hypotensive with SBP to 80s per EMS.  In ED, head CT and cervical spine were negative.  EtOH, Tylenol, ASA levels are normal.  She was noted to be in A. fib with RVR in low 100s.  She was also encephalopathic.  PCCM consulted 11/6 and patient was intubated.   In ICU, patient was treated for acute encephalopathy thought to be due to alcohol withdrawal, respiratory failure with hypercarbia and hypoxia and A. fib with RVR.  Infectious work-up including blood culture, urine culture, sputum culture and COVID-19 negative. Patient received empiric antibiotics from 11/5-11/10. Echo on 11/6 with EF of 55 to 60%, indeterminate DD and RVSP to 46 mmHg.  She was extubated to nasal cannula 11/9.  TRH assumed care 07/19/2019.  Started on Lasix for fluid overload with good response.  Patient continued to have some degree of confusion/disorientation which is likely going to be his baseline.   Subjective: No major events overnight of this morning.  No complaint this morning.  She is a sleepy but arouses to voice easily.  Somewhat disoriented but does not appear to be in acute distress.   Objective: Vitals:   07/23/19 1317 07/23/19 2029 07/24/19 0332 07/24/19 0446  BP: 93/67 113/84  118/84  Pulse: 95 93  94  Resp: Temp: 99.1 F (37.3 C) 97.8 F (36.6 C)  97.9 F  (36.6 C)  TempSrc: Oral     SpO2: 91% 99%  100%  Weight:   86.3 kg   Height:        Intake/Output Summary (Last 24 hours) at 07/24/2019 1221 Last data filed at 07/24/2019 1130 Gross per 24 hour  Intake 433 ml  Output 1300 ml  Net -867 ml   Filed Weights   07/22/19 0410 07/23/19 0518 07/24/19 0332  Weight: 87.8 kg 89.4 kg 86.3 kg    Examination:  GENERAL: No acute distress.  Sleepy but arouses to voice easily. HEENT: MMM.  Vision and hearing grossly intact.  NECK: Supple.  No apparent JVD.  RESP:  No IWOB. Good air movement bilaterally. CVS: Regular rhythm.  Normal rate.Marland Kitchen Heart sounds normal.  ABD/GI/GU: Bowel sounds present. Soft. Non tender.  MSK/EXT:  Moves extremities. No apparent deformity.  Trace edema in all extremities. SKIN: no apparent skin lesion or wound NEURO: Sleepy but arouses to voice easily.  Oriented only to self today.  Follows command.  Grip strength symmetric.  No apparent focal neuro deficit. PSYCH: Calm. Normal affect.  Assessment & Plan: Acute metabolic encephalopathy: EtOH abuse and polypharmacy at home.  UDS negative.  Thyroid panel normal.  CT head without acute finding.  No obvious source of infection but received empiric antibiotics 11/6-11/10.  Oriented to self and follows command. -Continue supportive care, multivitamins and thiamine's. -Treat treatable causes -Frequent reorientation and delirium precautions  Acute respiratory failure with hypoxia  and hypercarbia: Likely due to encephalopathy and alcohol intoxication.  Has history of asthma but only takes as needed albuterol. -ETT 11/6-11/9 -Continue as needed breathing treatments and diuretics as below -Aspiration precaution -Wean oxygen to room air  Acute diastolic CHF/fluid overload: Echo on 11/6 with EF of 55 to 60%, indeterminate DD and moderate PASP to 46.  She has dependent edema and about 38lbs weight gain since admission but the different scales.   BNP 220.  CXR with pulmonary  vascular congestion.  UOP 1.3 L in the last 24 hours.  Renal function stable. -Continue IV Lasix 20 mg daily -Monitor fluid status, renal function and electrolytes -Optimize nutrition-appreciate dietitian input  Paroxysmal A. Fib/sinus pauses: CHA2DS2-VASc score> 3.  Echo as above.  Discontinued nodal blocking agent due to frequent sinus pauses.  Rate controlled without medication -Closely monitor electrolytes and replenish aggressively -Continue Eliquis for anticoagulation  Hypotension: Normotensive.  Cortisol level within normal range. -Continue monitoring  Moderate pulmonary hypertension: Echo revealed RVSP to 46 mmHg. -Diuretics as above.  History of renal transplant on CellCept and Prograf: Renal function is stable.  Good urine output -Continue home CellCept and Prograf  Post intubation dysphagia -Appreciate guidance by SLP and dietitian-currently on full liquid diet  Hypoglycemia: Resolved.  Likely due to poor p.o. intake.  Cortisol level within normal range. -Continue monitoring  Anemia of chronic disease: Anemia panel consistent. Baseline 12-13. -Hgb 12-13 (baseline) >18.1 (admit-hemoconcentration)>11.2 (next day)>>9.2> 8.3> 9.6 -Continue monitoring. -Check Hemoccult  Leukopenia and thrombocytopenia: Resolved.  History of mood disorder -Continue home Wellbutrin -Reduced on trazodone to 100 mg daily.  Acute urinary retention: Had 3 I&O caths.  Foley catheter 11/14-11/16 -Urecholine 25 mg 3 times daily  Deconditioning/debility/generalized weakness: Far from baseline.  See above. -PT/OT-recommended SNF.  Pressure Injury 07/12/19 Buttocks Right Deep Tissue Injury - Purple or maroon localized area of discolored intact skin or blood-filled blister due to damage of underlying soft tissue from pressure and/or shear. Blister with three stripes and purple disco (Active)  07/12/19   Location: Buttocks  Location Orientation: Right  Staging: Deep Tissue Injury - Purple or  maroon localized area of discolored intact skin or blood-filled blister due to damage of underlying soft tissue from pressure and/or shear.  Wound Description (Comments): Blister with three stripes and purple discoloration  Present on Admission: Yes     Pressure Injury 07/12/19 Ischial tuberosity Lateral;Right Deep Tissue Injury - Purple or maroon localized area of discolored intact skin or blood-filled blister due to damage of underlying soft tissue from pressure and/or shear. open blister with purple  (Active)  07/12/19   Location: Ischial tuberosity  Location Orientation: Lateral;Right  Staging: Deep Tissue Injury - Purple or maroon localized area of discolored intact skin or blood-filled blister due to damage of underlying soft tissue from pressure and/or shear.  Wound Description (Comments): open blister with purple discolaration.  Present on Admission: Yes     Nutrition Problem: Inadequate oral intake Etiology: inability to eat  Signs/Symptoms: NPO status  Interventions: Ensure Enlive (each supplement provides 350kcal and 20 grams of protein), Magic cup   DVT prophylaxis: On Eliquis for A. fib Code Status: DNR/DNI Family Communication: Updated patient's daughter over the phone 11/15 Disposition Plan: Remains inpatient.  [facility]  Consultants: None  Procedures:  ETT 11/6-11/9  Microbiology summarized: 11/6-COVID-19 negative 11/6-MRSA PCR negative 11/5-blood cultures negative so far 11/5-urine cultures negative so far   Sch Meds:  Scheduled Meds: . apixaban  5 mg Oral  BID  . bethanechol  25 mg Oral TID  . buPROPion  300 mg Oral Daily  . Chlorhexidine Gluconate Cloth  6 each Topical Daily  . feeding supplement (ENSURE ENLIVE)  237 mL Oral BID BM  . folic acid  1 mg Oral Daily  . furosemide  20 mg Intravenous Daily  . gabapentin  100 mg Oral TID  . Gerhardt's butt cream   Topical TID  . insulin aspart  2-6 Units Subcutaneous Q4H  .  mouth rinse  15 mL Mouth Rinse BID  . mycophenolate  500 mg Oral BID  . nystatin cream   Topical BID  . pantoprazole  40 mg Oral Daily  . sodium chloride flush  10-40 mL Intracatheter Q12H  . tacrolimus  1 mg Oral QPM  . tacrolimus  2 mg Oral q morning - 10a  . thiamine  100 mg Oral Daily  . traZODone  100 mg Oral QHS   Continuous Infusions: . magnesium sulfate bolus IVPB 2 g (07/24/19 1152)   PRN Meds:.acetaminophen, [DISCONTINUED] ondansetron **OR** ondansetron (ZOFRAN) IV, sodium chloride flush  Antimicrobials: Anti-infectives (From admission, onward)   Start     Dose/Rate Route Frequency Ordered Stop   07/14/19 2200  vancomycin (VANCOCIN) 1,500 mg in sodium chloride 0.9 % 500 mL IVPB  Status:  Discontinued     1,500 mg 250 mL/hr over 120 Minutes Intravenous Every 48 hours 07/12/19 2229 07/13/19 1325   07/14/19 2200  vancomycin (VANCOCIN) 1,500 mg in sodium chloride 0.9 % 500 mL IVPB  Status:  Discontinued     1,500 mg 250 mL/hr over 120 Minutes Intravenous Every 48 hours 07/14/19 0915 07/14/19 1151   07/14/19 1130  aztreonam (AZACTAM) 2 g in sodium chloride 0.9 % 100 mL IVPB  Status:  Discontinued     2 g 200 mL/hr over 30 Minutes Intravenous Every 8 hours 07/13/19 1504 07/17/19 0812   07/13/19 1100  levofloxacin (LEVAQUIN) IVPB 750 mg  Status:  Discontinued     750 mg 100 mL/hr over 90 Minutes Intravenous Every 24 hours 07/13/19 1053 07/13/19 1055   07/13/19 1100  levofloxacin (LEVAQUIN) IVPB 500 mg  Status:  Discontinued     500 mg 100 mL/hr over 60 Minutes Intravenous Every 24 hours 07/13/19 1055 07/13/19 1319   07/12/19 2200  vancomycin (VANCOCIN) 1,500 mg in sodium chloride 0.9 % 500 mL IVPB     1,500 mg 250 mL/hr over 120 Minutes Intravenous  Once 07/12/19 2134 07/13/19 0224   07/12/19 2145  levofloxacin (LEVAQUIN) IVPB 750 mg  Status:  Discontinued     750 mg 100 mL/hr over 90 Minutes Intravenous  Once 07/12/19 2134 07/13/19 0900       I have personally reviewed  the following labs and images: CBC: Recent Labs  Lab 07/20/19 0431 07/21/19 0822 07/22/19 0507 07/23/19 0400 07/24/19 0548  WBC 6.1 6.0 4.0 2.9* 5.2  HGB 9.2* 9.9* 9.2* 8.3* 9.6*  HCT 28.5* 32.5* 29.4* 27.1* 30.2*  MCV 109.6* 113.2* 109.3* 111.1* 109.0*  PLT 138* 170 193 202 249   BMP &GFR Recent Labs  Lab 07/20/19 0431 07/21/19 0822 07/22/19 0507 07/23/19 0400 07/24/19 0548  NA 139 138 138 140 141  K 4.3 4.5 3.9 3.7 3.6  CL 111 110 109 109 107  CO2 23 23 24 25 28   GLUCOSE 129* 96 143* 132* 101*  BUN 11 9 8  6* 6*  CREATININE 0.41* 0.53 0.55 0.58 0.58  CALCIUM 8.9 9.1 9.1 8.7* 9.1  MG 2.0 1.7 1.7 1.5* 1.5*  PHOS 3.1 3.3 3.6 3.3 3.3   Estimated Creatinine Clearance: 71.5 mL/min (by C-G formula based on SCr of 0.58 mg/dL). Liver & Pancreas: Recent Labs  Lab 07/18/19 0917 07/19/19 0439 07/21/19 0822  AST 13* 18  --   ALT 20 22  --   ALKPHOS 50 47  --   BILITOT 1.1 0.9  --   PROT 4.3* 4.1*  --   ALBUMIN 1.8* 1.8* 2.1*   No results for input(s): LIPASE, AMYLASE in the last 168 hours. No results for input(s): AMMONIA in the last 168 hours. Diabetic: No results for input(s): HGBA1C in the last 72 hours. Recent Labs  Lab 07/23/19 1932 07/24/19 0004 07/24/19 0327 07/24/19 0722 07/24/19 1109  GLUCAP 91 101* 84 87 91   Cardiac Enzymes: Recent Labs  Lab 07/21/19 0822  CKTOTAL 17*   No results for input(s): PROBNP in the last 8760 hours. Coagulation Profile: No results for input(s): INR, PROTIME in the last 168 hours. Thyroid Function Tests: No results for input(s): TSH, T4TOTAL, FREET4, T3FREE, THYROIDAB in the last 72 hours. Lipid Profile: No results for input(s): CHOL, HDL, LDLCALC, TRIG, CHOLHDL, LDLDIRECT in the last 72 hours. Anemia Panel: No results for input(s): VITAMINB12, FOLATE, FERRITIN, TIBC, IRON, RETICCTPCT in the last 72 hours. Urine analysis:    Component Value Date/Time   COLORURINE AMBER (A) 07/12/2019 2200   APPEARANCEUR TURBID (A)  07/12/2019 2200   LABSPEC 1.025 07/12/2019 2200   PHURINE 5.0 07/12/2019 2200   GLUCOSEU 50 (A) 07/12/2019 2200   HGBUR NEGATIVE 07/12/2019 2200   BILIRUBINUR SMALL (A) 07/12/2019 2200   KETONESUR NEGATIVE 07/12/2019 2200   PROTEINUR 30 (A) 07/12/2019 2200   NITRITE NEGATIVE 07/12/2019 2200   LEUKOCYTESUR NEGATIVE 07/12/2019 2200   Sepsis Labs: Invalid input(s): PROCALCITONIN, LACTICIDVEN  Microbiology: No results found for this or any previous visit (from the past 240 hour(s)).  Radiology Studies: No results found.   T.  [facility]  If 7PM-7AM, please contact night-coverage [email or web address] Password [email or web address] 07/24/2019, 12:21 PM

## 2019-07-24 NOTE — Progress Notes (Signed)
  Speech Language Pathology Treatment: Dysphagia  Patient Details Name: Anna [name] MRN: [id number] DOB: 03-29-1951 Today's Date: 07/24/2019 Time: 3716-9678 SLP Time Calculation (min) (ACUTE ONLY): 30 min  Assessment / Plan / Recommendation Clinical Impression  Patient alert but not consistently following directions - and is delayed when responds.  She demonstrates bilateral arm tremoring which she states is new when SLP inquired.  Pt denies difficulty swallowing but delays with multiple swallows subjectively noted.  Intake is poor per pt and she does not like Ensure with yes/no question.  SLP assisted pt hand over hand to feed herself only a few bites *graham cracker, icecream, and water.  Prolonged mastication subjectively observed and pt desired liquid to transit food - SlP provided her with icecream due to increased aspiration risk of liquid spilling over bolus into larynx. Icecream effective to help clear solid oral residuals.    No coughing during intake noted today *that was observed during prior sessions but not appear to be directly coorelated to swallowing.  Per chart, she received Lasix to help with breathing/?CHF.    Also provided pt with intake of Boost Breeze *peach flavored* which she stated she did n't mind.  Intake is effortful for this pt and thus do not recommend to advance diet.  Highly recommend to encourage adequate liquid nutrition for pt's efficiency - educated her using teach back to said recommendations.    Recommend to consider a palliative consult on this pt with multiple comorbidities and poor intake.   HPI HPI: 68 yo female adm to [facility] 07/12/2019 after being found down at home with wine bottles around her.  She reported to MD that she drinks 6 1.5 liters of wine a week.  She requiring intubation  11/6-11/02/2019.  Pt head CT negative, CXR stable. H/O renal transplant, ETOH use, HTN, asthma.  WBC 11.2 and HR has been elevated at times.  Pt also with h/o admit with  hematemesis in 10/2017.      SLP Plan  Continue with current plan of care       Recommendations  Diet recommendations: Dysphagia 2 (fine chop);Thin liquid Liquids provided via: Cup;Straw Medication Administration: Whole meds with puree Supervision: Staff to assist with self feeding;Full supervision/cueing for compensatory strategies Compensations: Slow rate;Small sips/bites;Minimize environmental distractions;Follow solids with liquid(begin meal with warm liquid, follow solids with puree to help clear oral residuals) Postural Changes and/or Swallow Maneuvers: Seated upright 90 degrees;Upright 30-60 min after meal                Oral Care Recommendations: Oral care BID Follow up Recommendations: Skilled Nursing facility;24 hour supervision/assistance SLP Visit Diagnosis: Dysphagia, unspecified (R13.10);Dysphagia, oral phase (R13.11) Plan: Continue with current plan of care       Mary Esther, Jamorris Ndiaye Ann 07/24/2019, 4:46 PM   Luanna Salk, Delmita [facility] SLP Acute Rehab Services Pager [phone] Office [phone]

## 2019-07-24 NOTE — TOC Progression Note (Signed)
Transition of Care [facility]) - Progression Note    Patient Details  Name: Deania Siguenza MRN: [id number] Date of Birth: July 22, 1951  Transition of Care [facility]) CM/SW Contact  Cylee Dattilo, Juliann Pulse, RN Phone Number: 07/24/2019, 10:41 AM  Clinical Narrative:Faxed to [facility] fax#[phone]-for short term rehab-await response.Also still waiting for response from [facility] if able to accept. All other SNFS's has declined.       Expected Discharge Plan: [place] Barriers to Discharge: Continued Medical Work up  Expected Discharge Plan and Services Expected Discharge Plan: [place]   Discharge Planning Services: CM Consult Post Acute Care Choice: [place]                                         Social Determinants of Health (SDOH) Interventions    Readmission Risk Interventions No flowsheet data found.

## 2019-07-25 LAB — BASIC METABOLIC PANEL
Anion gap: 7 (ref 5–15)
BUN: 7 mg/dL — ABNORMAL LOW (ref 8–23)
CO2: 26 mmol/L (ref 22–32)
Calcium: 8.9 mg/dL (ref 8.9–10.3)
Chloride: 105 mmol/L (ref 98–111)
Creatinine, Ser: 0.56 mg/dL (ref 0.44–1.00)
GFR calc Af Amer: >60 mL/min (ref >60)
GFR calc non Af Amer: >60 mL/min (ref >60)
Glucose, Bld: 104 mg/dL — ABNORMAL HIGH (ref 70–99)
Potassium: 3.8 mmol/L (ref 3.5–5.1)
Sodium: 138 mmol/L (ref 135–145)

## 2019-07-25 LAB — SARS CORONAVIRUS 2 (TAT 6-24 HRS): SARS Coronavirus 2: NEGATIVE

## 2019-07-25 LAB — GLUCOSE, CAPILLARY
Glucose-Capillary: 101 mg/dL — ABNORMAL HIGH (ref 70–99)
Glucose-Capillary: 89 mg/dL (ref 70–99)

## 2019-07-25 LAB — HEMOGLOBIN AND HEMATOCRIT, BLOOD
HCT: 30.7 % — ABNORMAL LOW (ref 36.0–46.0)
Hemoglobin: 9.6 g/dL — ABNORMAL LOW (ref 12.0–15.0)

## 2019-07-25 LAB — MAGNESIUM: Magnesium: 1.9 mg/dL (ref 1.7–2.4)

## 2019-07-25 LAB — PHOSPHORUS: Phosphorus: 3.2 mg/dL (ref 2.5–4.6)

## 2019-07-25 MED ORDER — METOPROLOL TARTRATE 25 MG PO TABS: 25.0000 mg | ORAL_TABLET | Freq: Two times a day (BID) | ORAL | 11 refills | Status: AC | PRN

## 2019-07-25 MED ORDER — THIAMINE HCL 100 MG PO TABS: 100.0000 mg | ORAL_TABLET | Freq: Every day | ORAL | Status: AC

## 2019-07-25 MED ORDER — ACETAMINOPHEN 500 MG PO TABS: 500.0000 mg | ORAL_TABLET | Freq: Three times a day (TID) | ORAL | 0 refills | Status: AC

## 2019-07-25 MED ORDER — GERHARDT'S BUTT CREAM: 1.0000 "application " | TOPICAL_CREAM | Freq: Three times a day (TID) | CUTANEOUS | Status: AC

## 2019-07-25 MED ORDER — ENSURE ENLIVE PO LIQD: 237.0000 mL | Freq: Two times a day (BID) | ORAL | 12 refills | Status: AC

## 2019-07-25 MED ORDER — FUROSEMIDE 20 MG PO TABS
20.0000 mg | ORAL_TABLET | Freq: Every day | ORAL | Status: DC
  Administered 2019-07-26: 20 mg via ORAL
  Filled 2019-07-25: qty 1

## 2019-07-25 MED ORDER — PANTOPRAZOLE SODIUM 40 MG PO TBEC: 40.0000 mg | DELAYED_RELEASE_TABLET | Freq: Every day | ORAL | Status: AC

## 2019-07-25 MED ORDER — APIXABAN 5 MG PO TABS: 5.0000 mg | ORAL_TABLET | Freq: Two times a day (BID) | ORAL | Status: AC

## 2019-07-25 MED ORDER — FUROSEMIDE 20 MG PO TABS: 20.0000 mg | ORAL_TABLET | Freq: Every day | ORAL | Status: AC

## 2019-07-25 MED ORDER — TRAZODONE HCL 100 MG PO TABS: 100.0000 mg | ORAL_TABLET | Freq: Every day | ORAL | Status: AC

## 2019-07-25 MED ORDER — FOLIC ACID 1 MG PO TABS: 1.0000 mg | ORAL_TABLET | Freq: Every day | ORAL | Status: AC

## 2019-07-25 NOTE — Progress Notes (Signed)
OT Cancellation Note  Patient Details Name: Mariya Mottley MRN: [id number] DOB: 10-12-1950   Cancelled Treatment:    Reason Eval/Treat Not Completed: Patient declined, no reason specified.  Kadey Mihalic 07/25/2019, 3:00 PM  Lesle Chris, OTR/L Acute Rehabilitation Services [phone] [facility] pager [phone] office 07/25/2019

## 2019-07-25 NOTE — TOC Transition Note (Signed)
Transition of Care [facility]) - CM/SW Discharge Note   Patient Details  Name: Anna [name] MRN: [id number] Date of Birth: [birth year]  Transition of Care [facility]) CM/SW Contact:  Dessa Phi, RN Phone Number: 07/25/2019, 12:35 PM   Clinical Narrative: [place] care rep Sh[facility] Dir able to accept-TC dtr Darrelyn Hillock agree. Awaiting covid results. D/c plan for d/c in am. DNR needs attending signature in shadow chart.      Final next level of care: [place] Barriers to Discharge: No Barriers Identified   Patient Goals and CMS Choice        Discharge Placement PASRR number recieved: 07/20/19            Patient chooses bed at: [facility] Patient to be transferred to facility by: [facility] Name of family member notified: Darrelyn Hillock Patient and family notified of of transfer: 07/25/19  Discharge Plan and Services   Discharge Planning Services: CM Consult Post Acute Care Choice: [place]                               Social Determinants of Health (SDOH) Interventions     Readmission Risk Interventions No flowsheet data found.

## 2019-07-25 NOTE — Progress Notes (Signed)
PROGRESS NOTE  Anna [name] IRS:[id number] DOB: 1951/08/17   PCP: Patient, No Pcp Per  Patient is from: Home.  Per patient's daughter, lives alone in an apartment, independent for most ADL S, ambulates by holding to furniture's and fairly oriented.  Does not have walker or cane.  DOA: 07/12/2019 LOS: 13  Brief Narrative / Interim history: 68 year old female with history of asthma, HTN, renal transplant on immuno modulators and alcohol use presented 07/12/2019 after found down and minimally responsive at home.  It was noted that she had Tylenol pills and empty alcohol bottles around her when EMS found her.  She was hypoxic to 60s and hypotensive with SBP to 80s per EMS.  In ED, head CT and cervical spine were negative.  EtOH, Tylenol, ASA levels are normal.  She was noted to be in A. fib with RVR in low 100s.  She was also encephalopathic.  PCCM consulted 11/6 and patient was intubated.   In ICU, patient was treated for acute encephalopathy thought to be due to alcohol withdrawal, respiratory failure with hypercarbia and hypoxia and A. fib with RVR.  Infectious work-up including blood culture, urine culture, sputum culture and COVID-19 negative. Patient received empiric antibiotics from 11/5-11/10. Echo on 11/6 with EF of 55 to 60%, indeterminate DD and RVSP to 46 mmHg.  She was extubated to nasal cannula 11/9.  TRH assumed care 07/19/2019.  Started on Lasix for fluid overload with good response.  Patient continued to have some degree of confusion/disorientation which is likely going to be his baseline.   Subjective: No major events overnight of this morning.  No complaints.  Denies chest pain, dyspnea or abdominal pain.  Not a great historian.  Oriented to self, year and the president.  Objective: Vitals:   07/24/19 0446 07/24/19 1344 07/24/19 2059 07/25/19 0515  BP: 118/84 (!) 100/53 (!) 161/108 121/83  Pulse: 94 68 91 83  Resp: 16 20    Temp: 97.9 F (36.6 C) 98.1 F (36.7 C) 98.1  F (36.7 C)   TempSrc:  Oral Oral   SpO2: 100% 98% 96% 99%  Weight:      Height:        Intake/Output Summary (Last 24 hours) at 07/25/2019 1021 Last data filed at 07/25/2019 0836 Gross per 24 hour  Intake 250 ml  Output 100 ml  Net 150 ml   Filed Weights   07/22/19 0410 07/23/19 0518 07/24/19 0332  Weight: 87.8 kg 89.4 kg 86.3 kg    Examination:  GENERAL: No acute distress.  Appears well.  HEENT: MMM.  Vision and hearing grossly intact.  NECK: Supple.  No apparent JVD.  RESP:  No IWOB. Good air movement bilaterally. CVS: Irregular rhythm.  Normal rate. Heart sounds normal.  ABD/GI/GU: Bowel sounds present. Soft. Non tender.  MSK/EXT:  Moves extremities. No apparent deformity.  Trace edema. SKIN: Skin lesion overall right buttock and right ischial tuberosity as below. NEURO: Awake, alert and oriented self, year and the president.  Some resting tremor.  No apparent focal neuro deficit. PSYCH: Calm. Normal affect  Assessment & Plan: Acute metabolic encephalopathy: EtOH abuse and polypharmacy at home.  UDS negative.  Thyroid panel normal.  CT head without acute finding.  No obvious source of infection but received empiric antibiotics 11/6-11/10.  Oriented to self and follows command. -Continue supportive care, multivitamins and thiamine's. -Treat treatable causes -Frequent reorientation and delirium precautions  Acute respiratory failure with hypoxia and hypercarbia: Likely due to encephalopathy and alcohol intoxication.  Has history of asthma but only takes as needed albuterol. -ETT 11/6-11/9 -Continue as needed breathing treatments and diuretics as below -Aspiration precautions -Discontinue oxygen.  Acute diastolic CHF/fluid overload: Echo on 11/6 with EF of 55 to 60%, indeterminate DD and moderate PASP to 46.  She has dependent edema and about 38lbs weight gain since admission but the different scales.   BNP 220.  CXR with pulmonary vascular congestion.  I and O  incomplete.  Renal function stable. -Switch to p.o. Lasix 20 mg daily -Monitor fluid status, renal function and electrolytes -Optimize nutrition-appreciate dietitian input  Paroxysmal A. Fib/sinus pauses: CHA2DS2-VASc score> 3.  Echo as above.  Discontinued nodal blocking agent due to frequent sinus pauses.  Rate controlled without medication -Closely monitor electrolytes and replenish aggressively -Continue Eliquis for anticoagulation  Hypotension: Normotensive.  Cortisol level within normal range. -Continue monitoring  Moderate pulmonary hypertension: Echo revealed RVSP to 46 mmHg. -Diuretics as above.  History of renal transplant on CellCept and Prograf: Renal function is stable.  Good urine output -Continue home CellCept and Prograf  Post intubation dysphagia -Appreciate guidance by SLP and dietitian-currently on full liquid diet  Hypoglycemia: Resolved.  Likely due to poor p.o. intake.  Cortisol level within normal range. -Will monitor CBG intermittently.  Anemia of chronic disease: Anemia panel consistent. Baseline 12-13. -Hgb 12-13 (baseline) >18.1 (admit-hemoconcentration)>11.2 (next day)>>9.2> 8.3> 9.6 -Continue monitoring. -Check Hemoccult  Leukopenia and thrombocytopenia: Resolved.  History of mood disorder -Continue home Wellbutrin -Reduced on trazodone to 100 mg daily.  Acute urinary retention: Had 3 I&O caths.  Foley catheter 11/14-11/16 -Urecholine 25 mg 3 times daily -Intermittent bladder scan  Deconditioning/debility/generalized weakness: Far from baseline.  See above. -PT/OT-recommended SNF.  Pressure Injury 07/12/19 Buttocks Right Deep Tissue Injury - Purple or maroon localized area of discolored intact skin or blood-filled blister due to damage of underlying soft tissue from pressure and/or shear. Blister with three stripes and purple disco (Active)  07/12/19   Location: Buttocks  Location Orientation: Right  Staging: Deep Tissue Injury - Purple or  maroon localized area of discolored intact skin or blood-filled blister due to damage of underlying soft tissue from pressure and/or shear.  Wound Description (Comments): Blister with three stripes and purple discoloration  Present on Admission: Yes     Pressure Injury 07/12/19 Ischial tuberosity Lateral;Right Deep Tissue Injury - Purple or maroon localized area of discolored intact skin or blood-filled blister due to damage of underlying soft tissue from pressure and/or shear. open blister with purple  (Active)  07/12/19   Location: Ischial tuberosity  Location Orientation: Lateral;Right  Staging: Deep Tissue Injury - Purple or maroon localized area of discolored intact skin or blood-filled blister due to damage of underlying soft tissue from pressure and/or shear.  Wound Description (Comments): open blister with purple discolaration.  Present on Admission: Yes     Nutrition Problem: Inadequate oral intake Etiology: inability to eat  Signs/Symptoms: NPO status  Interventions: Ensure Enlive (each supplement provides 350kcal and 20 grams of protein), Magic cup   DVT prophylaxis: On Eliquis for A. fib Code Status: DNR/DNI Family Communication: Updated patient's daughter over the phone 11/15 Disposition Plan: Remains inpatient.  [facility]  Consultants: None  Procedures:  ETT 11/6-11/9  Microbiology summarized: 11/6-COVID-19 negative 11/6-MRSA PCR negative 11/5-blood cultures negative so far 11/5-urine cultures negative so far   Sch Meds:  Scheduled Meds: . apixaban  5 mg Oral BID  . bethanechol  25 mg Oral TID  .  buPROPion  300 mg Oral Daily  . feeding supplement (ENSURE ENLIVE)  237 mL Oral BID BM  . folic acid  1 mg Oral Daily  . furosemide  20 mg Intravenous Daily  . gabapentin  100 mg Oral TID  . Gerhardt's butt cream   Topical TID  . mouth rinse  15 mL Mouth Rinse BID  . mycophenolate  500 mg Oral BID  . nystatin cream   Topical BID   . pantoprazole  40 mg Oral Daily  . sodium chloride flush  10-40 mL Intracatheter Q12H  . tacrolimus  1 mg Oral QPM  . tacrolimus  2 mg Oral q morning - 10a  . thiamine  100 mg Oral Daily  . traZODone  100 mg Oral QHS   Continuous Infusions:  PRN Meds:.acetaminophen, [DISCONTINUED] ondansetron **OR** ondansetron (ZOFRAN) IV, sodium chloride flush  Antimicrobials: Anti-infectives (From admission, onward)   Start     Dose/Rate Route Frequency Ordered Stop   07/14/19 2200  vancomycin (VANCOCIN) 1,500 mg in sodium chloride 0.9 % 500 mL IVPB  Status:  Discontinued     1,500 mg 250 mL/hr over 120 Minutes Intravenous Every 48 hours 07/12/19 2229 07/13/19 1325   07/14/19 2200  vancomycin (VANCOCIN) 1,500 mg in sodium chloride 0.9 % 500 mL IVPB  Status:  Discontinued     1,500 mg 250 mL/hr over 120 Minutes Intravenous Every 48 hours 07/14/19 0915 07/14/19 1151   07/14/19 1130  aztreonam (AZACTAM) 2 g in sodium chloride 0.9 % 100 mL IVPB  Status:  Discontinued     2 g 200 mL/hr over 30 Minutes Intravenous Every 8 hours 07/13/19 1504 07/17/19 0812   07/13/19 1100  levofloxacin (LEVAQUIN) IVPB 750 mg  Status:  Discontinued     750 mg 100 mL/hr over 90 Minutes Intravenous Every 24 hours 07/13/19 1053 07/13/19 1055   07/13/19 1100  levofloxacin (LEVAQUIN) IVPB 500 mg  Status:  Discontinued     500 mg 100 mL/hr over 60 Minutes Intravenous Every 24 hours 07/13/19 1055 07/13/19 1319   07/12/19 2200  vancomycin (VANCOCIN) 1,500 mg in sodium chloride 0.9 % 500 mL IVPB     1,500 mg 250 mL/hr over 120 Minutes Intravenous  Once 07/12/19 2134 07/13/19 0224   07/12/19 2145  levofloxacin (LEVAQUIN) IVPB 750 mg  Status:  Discontinued     750 mg 100 mL/hr over 90 Minutes Intravenous  Once 07/12/19 2134 07/13/19 0900       I have personally reviewed the following labs and images: CBC: Recent Labs  Lab 07/20/19 0431 07/21/19 0822 07/22/19 0507 07/23/19 0400 07/24/19 0548 07/25/19 0507  WBC 6.1  6.0 4.0 2.9* 5.2  --   HGB 9.2* 9.9* 9.2* 8.3* 9.6* 9.6*  HCT 28.5* 32.5* 29.4* 27.1* 30.2* 30.7*  MCV 109.6* 113.2* 109.3* 111.1* 109.0*  --   PLT 138* 170 193 202 249  --    BMP &GFR Recent Labs  Lab 07/21/19 0822 07/22/19 0507 07/23/19 0400 07/24/19 0548 07/25/19 0507  NA 138 138 140 141 138  K 4.5 3.9 3.7 3.6 3.8  CL 110 109 109 107 105  CO2 23 24 25 28 26   GLUCOSE 96 143* 132* 101* 104*  BUN 9 8 6* 6* 7*  CREATININE 0.53 0.55 0.58 0.58 0.56  CALCIUM 9.1 9.1 8.7* 9.1 8.9  MG 1.7 1.7 1.5* 1.5* 1.9  PHOS 3.3 3.6 3.3 3.3 3.2   Estimated Creatinine Clearance: 71.5 mL/min (by C-G formula based on SCr of  0.56 mg/dL). Liver & Pancreas: Recent Labs  Lab 07/19/19 0439 07/21/19 0822  AST 18  --   ALT 22  --   ALKPHOS 47  --   BILITOT 0.9  --   PROT 4.1*  --   ALBUMIN 1.8* 2.1*   No results for input(s): LIPASE, AMYLASE in the last 168 hours. No results for input(s): AMMONIA in the last 168 hours. Diabetic: No results for input(s): HGBA1C in the last 72 hours. Recent Labs  Lab 07/24/19 1636 07/24/19 1952 07/24/19 2348 07/25/19 0402 07/25/19 0730  GLUCAP 103* 119* 114* 101* 89   Cardiac Enzymes: Recent Labs  Lab 07/21/19 0822  CKTOTAL 17*   No results for input(s): PROBNP in the last 8760 hours. Coagulation Profile: No results for input(s): INR, PROTIME in the last 168 hours. Thyroid Function Tests: No results for input(s): TSH, T4TOTAL, FREET4, T3FREE, THYROIDAB in the last 72 hours. Lipid Profile: No results for input(s): CHOL, HDL, LDLCALC, TRIG, CHOLHDL, LDLDIRECT in the last 72 hours. Anemia Panel: No results for input(s): VITAMINB12, FOLATE, FERRITIN, TIBC, IRON, RETICCTPCT in the last 72 hours. Urine analysis:    Component Value Date/Time   COLORURINE AMBER (A) 07/12/2019 2200   APPEARANCEUR TURBID (A) 07/12/2019 2200   LABSPEC 1.025 07/12/2019 2200   PHURINE 5.0 07/12/2019 2200   GLUCOSEU 50 (A) 07/12/2019 2200   HGBUR NEGATIVE 07/12/2019 2200    BILIRUBINUR SMALL (A) 07/12/2019 2200   KETONESUR NEGATIVE 07/12/2019 2200   PROTEINUR 30 (A) 07/12/2019 2200   NITRITE NEGATIVE 07/12/2019 2200   LEUKOCYTESUR NEGATIVE 07/12/2019 2200   Sepsis Labs: Invalid input(s): PROCALCITONIN, LACTICIDVEN  Microbiology: No results found for this or any previous visit (from the past 240 hour(s)).  Radiology Studies: No results found.   T.  [facility]  If 7PM-7AM, please contact night-coverage [email or web address] Password [email or web address] 07/25/2019, 10:21 AM

## 2019-07-25 NOTE — TOC Progression Note (Signed)
Transition of Care [facility]) - Progression Note    Patient Details  Name: Anna [name] MRN: [id number] Date of Birth: 15-Jan-1951  Transition of Care [facility]) CM/SW Contact  Shedrick Sarli, Juliann Pulse, RN Phone Number: 07/25/2019, 11:28 AM  Clinical Narrative: TC [facility] spoke to [place] [phone] also sent email-confirmed [facility] LTC contracts-with [place] aware  Following, [place] also following-informed them patient has medicare also. All forms has been completed, & faxed  for the [employer] in shadow chart. Continue to f/u.      Expected Discharge Plan: [place] Barriers to Discharge: Continued Medical Work up  Expected Discharge Plan and Services Expected Discharge Plan: [place]   Discharge Planning Services: CM Consult Post Acute Care Choice: [place]                                         Social Determinants of Health (SDOH) Interventions    Readmission Risk Interventions No flowsheet data found.

## 2019-07-25 NOTE — Progress Notes (Signed)
PT Cancellation Note  Patient Details Name: Anna [name] MRN: [id number] DOB: February 21, 1951   Cancelled Treatment:     Pt OOB to recliner via [facility] and nursing staff.  Pt plans to D/C to SNF either Thur/Friday.   Rica Koyanagi  PTA Acute  Rehabilitation Services Pager      [phone] Office      [phone]

## 2019-07-26 DIAGNOSIS — F39 Unspecified mood [affective] disorder: Secondary | ICD-10-CM

## 2019-07-26 LAB — PHOSPHORUS: Phosphorus: 3.4 mg/dL (ref 2.5–4.6)

## 2019-07-26 LAB — BASIC METABOLIC PANEL
Anion gap: 9 (ref 5–15)
BUN: 7 mg/dL — ABNORMAL LOW (ref 8–23)
CO2: 27 mmol/L (ref 22–32)
Calcium: 8.8 mg/dL — ABNORMAL LOW (ref 8.9–10.3)
Chloride: 102 mmol/L (ref 98–111)
Creatinine, Ser: 0.64 mg/dL (ref 0.44–1.00)
GFR calc Af Amer: >60 mL/min (ref >60)
GFR calc non Af Amer: >60 mL/min (ref >60)
Glucose, Bld: 102 mg/dL — ABNORMAL HIGH (ref 70–99)
Potassium: 3.4 mmol/L — ABNORMAL LOW (ref 3.5–5.1)
Sodium: 138 mmol/L (ref 135–145)

## 2019-07-26 LAB — MAGNESIUM: Magnesium: 1.8 mg/dL (ref 1.7–2.4)

## 2019-07-26 LAB — GLUCOSE, CAPILLARY: Glucose-Capillary: 96 mg/dL (ref 70–99)

## 2019-07-26 NOTE — Care Management Important Message (Signed)
Important Message  Patient Details IM Letter given to Dessa Phi RN to present to the Patient Name: Anna [name] MRN: [id number] Date of Birth: 12/29/1950   Medicare Important Message Given:  Yes     Kerin Salen 07/26/2019, 10:51 AM

## 2019-07-26 NOTE — TOC Transition Note (Signed)
Transition of Care [facility]) - CM/SW Discharge Note   Patient Details  Name: Anna [name] MRN: [id number] Date of Birth: Nov 14, 1950  Transition of Care [facility]) CM/SW Contact:  Anna Phi, RN Phone Number: 07/26/2019, 10:19 AM   Clinical Narrative: Received from [facility] list of LTC facilities-but when CM called them [facility] were not able to accept patient. Explained to dtr they denied short term rehab-no beds available/patient was not currently @ a [facility] facility therefore unable to to be approved for Short term rehab-rep [employer] [phone]. Patient accepted @ [facility] able to receive today [id number], nurse to call report to [phone]. Dtr Anna [name] contacted & agreed to d/c to [facility] by [facility] will arrange for after 12p pick up-N[facility] aware-forms with secy. No further CM needs.      Final next level of care: [place] Barriers to Discharge: No Barriers Identified   Patient Goals and CMS Choice        Discharge Placement PASRR number recieved: 07/20/19            Patient chooses bed at: [facility] Patient to be transferred to facility by: [facility] Name of family member notified: Anna [name] [phone] Patient and family notified of of transfer: 07/26/19  Discharge Plan and Services   Discharge Planning Services: CM Consult Post Acute Care Choice: [place]                               Social Determinants of Health (SDOH) Interventions     Readmission Risk Interventions No flowsheet data found.

## 2019-07-26 NOTE — Discharge Summary (Signed)
Physician Discharge Summary  Anna [name] HYQ:[id number] DOB: Jan 21, 1951 DOA: 07/12/2019  PCP: Patient, No Pcp Per  Admit date: 07/12/2019 Discharge date: 07/26/2019  Admitted From: Home Disposition: SNF  Recommendations for Outpatient Follow-up:  1. Follow up with PCP in 1-2 weeks 2. Please obtain CBC/BMP/Mag at follow up 3. Please follow up on the following pending results: None  Home Health: Not applicable Equipment/Devices: Not applicable  Discharge Condition: Stable CODE STATUS: DNR/DNI  Contact information for after-discharge care    [facility] .   Service: Skilled Nursing Contact information: [place] [phone]              Hospital Course: 68 year old female with history of asthma, HTN, renal transplant on immuno modulators and alcohol use presented 07/12/2019 after found down and minimally responsive at home.  It was noted that she had Tylenol pills and empty alcohol bottles around her when EMS found her.  She was hypoxic to 60s and hypotensive with SBP to 80s per EMS.  In ED, head CT and cervical spine were negative.  EtOH, Tylenol, ASA levels are normal.  She was noted to be in A. fib with RVR in low 100s.  She was also encephalopathic.  PCCM consulted 11/6 and patient was intubated.   In ICU, patient was treated for acute encephalopathy thought to be due to alcohol withdrawal, respiratory failure with hypercarbia and hypoxia and A. fib with RVR.  Infectious work-up including blood culture, urine culture, sputum culture and COVID-19 negative. Patient received empiric antibiotics from 11/5-11/10. Echo on 07/13/19 with EF of 55 to 60%, indeterminate DD and RVSP to 46 mmHg.  She was extubated to nasal cannula 07/16/19.  TRH assumed care 07/19/2019.  Started on IV Lasix for fluid overload/acute diastolic CHF with good response.  She was transitioned to oral Lasix and continue  to diurese well.   In regards to A. Fib, she was briefly started on Cardizem and digoxin but had frequent sinus pauses.  So digoxin and Cardizem discontinued and heart rate remained within normal range without any nodal blocking agents for antiarrhythmics.  She is on Eliquis for anticoagulation.  From mental status standpoint, we made significant changes to her medication with improvement in her mental status although she reported to have intermittent disorientations likely from delirium.  We emphasized frequent reorientation and delirium precautions at facility.   Patient was evaluated by PT/OT who recommended SNF.   See individual problems below for more hospital course.  Discharge Diagnoses:  Acute metabolic encephalopathy: EtOH abuse and polypharmacy at home.  UDS negative.  Thyroid panel normal.  CT head without acute finding.  No obvious source of infection but received empiric antibiotics 11/6-11/10.  Oriented to self and follows command. -Continue supportive care, multivitamins and thiamine's. -Frequent reorientation and delirium precautions  Acute respiratory failure with hypoxia and hypercarbia: Likely due to encephalopathy and alcohol intoxication.  Has history of asthma but only takes as needed albuterol. -ETT 11/6-11/9 -Continue diuretics and as needed breathing treatments  Acute diastolic CHF/fluid overload: Echo on 11/6 with EF of 55 to 60%, indeterminate DD and moderate PASP to 46.  She has dependent edema and about 20 pounds weight gain since admission but on different scales. BNP 220.  CXR with pulmonary vascular congestion.  Had 1.4 L UOP during the day but not captured overnight.  Renal function stable. -Discharged on Lasix 20 mg daily.  Give additional 20 mg if significant  weight gain or edema, or shortness of breath. -Recommend daily weights and sodium restriction. -Check BMP and magnesium in 1 week  Paroxysmal A. Fib/sinus pauses: CHA2DS2-VASc score> 3.  Echo as  above.  Discontinued nodal blocking agent due to frequent sinus pauses.  Rate controlled without medication. -Continue Eliquis for anticoagulation -Recheck BMP and magnesium at follow-up. -Metoprolol 25 mg twice daily as needed for HR > 110 or SBP > 150.  Hypotension: Normotensive.  Cortisol level within normal range. -Continue monitoring  Moderate pulmonary hypertension: Echo revealed RVSP to 46 mmHg. -Diuretics as above.  History of renal transplant on CellCept and Prograf: Renal function is stable.  Good urine output -Continue home CellCept and Prograf  Post intubation dysphagia -SLP recommended dysphagia 2 diet -Recommend ongoing speech eval to SNF. -Recommend dietitian evaluation at SNF.  Hypoglycemia: Resolved.  Likely due to poor p.o. intake.  Cortisol level within normal range.  Anemia of chronic disease: Anemia panel consistent. Baseline 12-13. -Hgb 12-13 (baseline) >18.1 (admit-hemoconcentration)>11.2 (next day)>>9.2> 8.3> 9.6 -Recheck CBC at follow-up  Leukopenia and thrombocytopenia: Resolved.  History of mood disorder -Continue home Wellbutrin -Reduced trazodone to 100 mg daily.  Acute urinary retention: Had 3 I&O caths.  Foley catheter 11/14-11/16.  Resolved. -Intermittent bladder scan as needed  Deconditioning/debility/generalized weakness: Far from baseline.  See above. -PT/OT-recommended SNF for ongoing PT/OT/SLP.  Pressure Injury 07/12/19 Buttocks Right Deep Tissue Injury - Purple or maroon localized area of discolored intact skin or blood-filled blister due to damage of underlying soft tissue from pressure and/or shear. Blister with three stripes and purple disco (Active)  07/12/19   Location: Buttocks  Location Orientation: Right  Staging: Deep Tissue Injury - Purple or maroon localized area of discolored intact skin or blood-filled blister due to damage of underlying soft tissue from pressure and/or shear.  Wound Description (Comments):  Blister with three stripes and purple discoloration  Present on Admission: Yes     Pressure Injury 07/12/19 Ischial tuberosity Lateral;Right Deep Tissue Injury - Purple or maroon localized area of discolored intact skin or blood-filled blister due to damage of underlying soft tissue from pressure and/or shear. open blister with purple  (Active)  07/12/19   Location: Ischial tuberosity  Location Orientation: Lateral;Right  Staging: Deep Tissue Injury - Purple or maroon localized area of discolored intact skin or blood-filled blister due to damage of underlying soft tissue from pressure and/or shear.  Wound Description (Comments): open blister with purple discolaration.  Present on Admission: Yes     Nutrition Problem: Inadequate oral intake Etiology: inability to eat  Interventions: Ensure Enlive (each supplement provides 350kcal and 20 grams of protein), Magic cup  Discharge Instructions  Discharge Instructions    (HEART FAILURE PATIENTS) Call MD:  Anytime you have any of the following symptoms: 1) 3 pound weight gain in 24 hours or 5 pounds in 1 week 2) shortness of breath, with or without a dry hacking cough 3) swelling in the hands, feet or stomach 4) if you have to sleep on extra pillows at night in order to breathe.   Complete by: As directed    Call MD for:  difficulty breathing, headache or visual disturbances   Complete by: As directed    Call MD for:  persistant dizziness or light-headedness   Complete by: As directed    Diet - low sodium heart healthy   Complete by: As directed    Increase activity slowly   Complete by: As directed      Allergies as  of 07/26/2019      Reactions   Iodine-131 Hives, Shortness Of Breath   Nsaids Other (See Comments)   CANNOT TAKE DUE TO KIDNEY TRANSPLANT    Penicillin G Rash   Has patient had a PCN reaction causing immediate rash, facial/tongue/throat swelling, SOB or lightheadedness with hypotension: Yes Has patient had a PCN  reaction causing severe rash involving mucus membranes or skin necrosis: Unk Has patient had a PCN reaction that required hospitalization: Unk Has patient had a PCN reaction occurring within the last 10 years: No If all of the above answers are "NO", then may proceed with Cephalosporin use.      Medication List    STOP taking these medications   amLODipine 5 MG tablet Commonly known as: NORVASC   aspirin 81 MG chewable tablet   aspirin-acetaminophen-caffeine 250-250-65 MG tablet Commonly known as: EXCEDRIN MIGRAINE   atenolol 25 MG tablet Commonly known as: TENORMIN   B-complex with vitamin C tablet   calcium carbonate 500 MG chewable tablet Commonly known as: TUMS - dosed in mg elemental calcium   clindamycin 150 MG capsule Commonly known as: CLEOCIN   Evzio 0.4 MG/0.4ML Soaj Generic drug: Naloxone HCl   Excedrin PM 38-500 MG Tabs Generic drug: diphenhydrAMINE-APAP (sleep)   Fish Oil 1000 MG Caps   lidocaine 5 % Commonly known as: LIDODERM   LORazepam 1 MG tablet Commonly known as: ATIVAN   MAGNESIUM CHLORIDE PO   methocarbamol 500 MG tablet Commonly known as: ROBAXIN   mirtazapine 15 MG tablet Commonly known as: REMERON   naproxen sodium 220 MG tablet Commonly known as: ALEVE   OVER THE COUNTER MEDICATION   ranitidine 150 MG capsule Commonly known as: ZANTAC   Vitamin A 2400 MCG (8000 UT) Tabs     TAKE these medications   acetaminophen 500 MG tablet Commonly known as: TYLENOL Take 1 tablet (500 mg total) by mouth every 8 (eight) hours. What changed:   when to take this  reasons to take this   Albuterol Sulfate 108 (90 Base) MCG/ACT Aepb Inhale 2 puffs into the lungs 4 (four) times daily as needed (wheezing/shortness of breath).   apixaban 5 MG Tabs tablet Commonly known as: ELIQUIS Take 1 tablet (5 mg total) by mouth 2 (two) times daily.   buPROPion 300 MG 24 hr tablet Commonly known as: WELLBUTRIN XL Take 300 mg by mouth daily.    carboxymethylcellulose 0.5 % Soln Commonly known as: REFRESH PLUS Place 1 drop into both eyes 3 (three) times daily as needed (dry eyes).   cholecalciferol 1000 units tablet Commonly known as: VITAMIN D Take 1,000 Units by mouth daily.   feeding supplement (ENSURE ENLIVE) Liqd Take 237 mLs by mouth 2 (two) times daily between meals.   fluticasone 50 MCG/ACT nasal spray Commonly known as: FLONASE Place 1 spray into both nostrils daily as needed for allergies or rhinitis.   folic acid 1 MG tablet Commonly known as: FOLVITE Take 1 tablet (1 mg total) by mouth daily.   furosemide 20 MG tablet Commonly known as: LASIX Take 1 tablet (20 mg total) by mouth daily.   gabapentin 100 MG capsule Commonly known as: NEURONTIN Take 100 mg by mouth 3 (three) times daily.   Gerhardt's butt cream Crea Apply 1 application topically 3 (three) times daily.   ipratropium 0.03 % nasal spray Commonly known as: ATROVENT Place 2 sprays into both nostrils 3 (three) times daily as needed for rhinitis.   Melatonin 5 MG Tabs Take  5 mg by mouth at bedtime as needed (sleep).   metoprolol tartrate 25 MG tablet Commonly known as: LOPRESSOR Take 1 tablet (25 mg total) by mouth 2 (two) times daily as needed (HR > 110 for over 10 minutes or SBP > 150).   montelukast 10 MG tablet Commonly known as: SINGULAIR Take 10 mg by mouth at bedtime.   mycophenolate 500 MG tablet Commonly known as: CELLCEPT Take 500 mg by mouth 2 (two) times daily.   pantoprazole 40 MG tablet Commonly known as: PROTONIX Take 1 tablet (40 mg total) by mouth daily.   tacrolimus 1 MG capsule Commonly known as: PROGRAF Take 1-2 mg by mouth See admin instructions. Take 2 capsules (2 mg) by mouth every morning and 1 capsule (1 mg) every evening   thiamine 100 MG tablet Take 1 tablet (100 mg total) by mouth daily.   traZODone 100 MG tablet Commonly known as: DESYREL Take 1 tablet (100 mg total) by mouth at bedtime. What  changed:   medication strength  how much to take       Consultations:  PCCM  Procedures/Studies:  2D Echo on 07/13/2019  1. Left ventricular ejection fraction, by visual estimation, is 55 to 60%. The left ventricle has normal function. There is mildly increased left ventricular hypertrophy.  2. 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 The aortic valve is tricuspid. Aortic valve regurgitation is mild to moderate. No evidence of aortic valve sclerosis or stenosis.  4. Left ventricular diastolic parameters are indeterminate.  5. Global right ventricle has normal systolic function.The right ventricular size is normal. No increase in right ventricular wall thickness.  6. Left atrial size was mildly dilated.  7. Right atrial size was normal.  8. Trivial pericardial effusion is present.  9. The mitral valve is normal in structure. Mild mitral valve regurgitation. 10. The tricuspid valve is normal in structure. Tricuspid valve regurgitation is trivial. 11. The pulmonic valve was not well visualized. Pulmonic valve regurgitation is trivial 12. Moderately elevated pulmonary artery systolic pressure. 13. Presence of pericardial fat pad.   Intubation from 07/12/2210/9/20  Dg Abd 1 View  Result Date: 07/17/2019 CLINICAL DATA:  Check gastric catheter placement EXAM: ABDOMEN - 1 VIEW COMPARISON:  07/13/19 FINDINGS: Gastric catheter is noted coiled within the stomach with the tip directed towards the pylorus. Postsurgical changes in the lower lumbar spine are noted. Stable scoliotic curvature is seen. No free air is noted. IMPRESSION: Gastric catheter within the distal stomach. Electronically Signed   By: Alcide Clever M.D.   On: 07/17/2019 00:35   Ct Head Wo Contrast  Result Date: 07/12/2019 CLINICAL DATA:  Found on floor.  Altered level of consciousness. EXAM: CT HEAD WITHOUT CONTRAST CT CERVICAL SPINE WITHOUT CONTRAST TECHNIQUE: Multidetector CT imaging of the head and cervical  spine was performed following the standard protocol without intravenous contrast. Multiplanar CT image reconstructions of the cervical spine were also generated. COMPARISON:  10/09/2017 FINDINGS: CT HEAD FINDINGS Brain: There is atrophy and chronic small vessel disease changes. No acute intracranial abnormality. Specifically, no hemorrhage, hydrocephalus, mass lesion, acute infarction, or significant intracranial injury. Vascular: No hyperdense vessel or unexpected calcification. Skull: No acute calvarial abnormality. Sinuses/Orbits: Visualized paranasal sinuses and mastoids clear. Orbital soft tissues unremarkable. Other: None CT CERVICAL SPINE FINDINGS Alignment: Normal Skull base and vertebrae: No acute fracture. No primary bone lesion or focal pathologic process. Soft tissues and spinal canal: No prevertebral fluid or swelling. No visible canal hematoma. Disc levels:  Diffuse degenerative disc and facet disease. Upper chest: No acute findings Other: None IMPRESSION: Atrophy, chronic microvascular disease. No acute intracranial abnormality. No acute bony abnormality in the cervical spine.  Spondylosis. Electronically Signed   By: Charlett NoseKevin  Dover M.D.   On: 07/12/2019 21:02   Ct Cervical Spine Wo Contrast  Result Date: 07/12/2019 CLINICAL DATA:  Found on floor.  Altered level of consciousness. EXAM: CT HEAD WITHOUT CONTRAST CT CERVICAL SPINE WITHOUT CONTRAST TECHNIQUE: Multidetector CT imaging of the head and cervical spine was performed following the standard protocol without intravenous contrast. Multiplanar CT image reconstructions of the cervical spine were also generated. COMPARISON:  10/09/2017 FINDINGS: CT HEAD FINDINGS Brain: There is atrophy and chronic small vessel disease changes. No acute intracranial abnormality. Specifically, no hemorrhage, hydrocephalus, mass lesion, acute infarction, or significant intracranial injury. Vascular: No hyperdense vessel or unexpected calcification. Skull: No acute  calvarial abnormality. Sinuses/Orbits: Visualized paranasal sinuses and mastoids clear. Orbital soft tissues unremarkable. Other: None CT CERVICAL SPINE FINDINGS Alignment: Normal Skull base and vertebrae: No acute fracture. No primary bone lesion or focal pathologic process. Soft tissues and spinal canal: No prevertebral fluid or swelling. No visible canal hematoma. Disc levels:  Diffuse degenerative disc and facet disease. Upper chest: No acute findings Other: None IMPRESSION: Atrophy, chronic microvascular disease. No acute intracranial abnormality. No acute bony abnormality in the cervical spine.  Spondylosis. Electronically Signed   By: Charlett NoseKevin  Dover M.D.   On: 07/12/2019 21:02   Dg Chest Port 1 View  Result Date: 07/21/2019 CLINICAL DATA:  Cough. EXAM: PORTABLE CHEST 1 VIEW COMPARISON:  07/17/2019 FINDINGS: Cardiac enlargement and low lung volumes. Bilateral pleural effusions and pulmonary vascular congestion. Visualized bony thorax is unremarkable. IMPRESSION: 1. Low lung volumes. 2. Bilateral pleural effusions and pulmonary vascular congestion compatible with CHF. Electronically Signed   By: Signa Kellaylor  Stroud M.D.   On: 07/21/2019 11:46   Portable Chest  Result Date: 07/16/2019 CLINICAL DATA:  Found down unresponsive EXAM: PORTABLE CHEST 1 VIEW COMPARISON:  July 15, 2004 FINDINGS: The right-sided central venous catheter is well positioned near the cavoatrial junction. There is no pneumothorax. No large pleural effusion. There may be some blunting of the left costophrenic angle which can be due to atelectasis or a trace left-sided effusion. There is a stable sclerotic lesion in the proximal right humerus. Evaluation of the left lung apex is limited by overlapping soft tissue and osseous structures. IMPRESSION: Stable appearance of the chest with no significant interval change. Electronically Signed   By: Katherine Mantlehristopher  Green M.D.   On: 07/16/2019 19:06   Dg Chest Port 1 View  Result Date:  07/16/2019 CLINICAL DATA:  Intubation. EXAM: PORTABLE CHEST 1 VIEW COMPARISON:  07/15/2019. FINDINGS: Endotracheal tube, NG tube, right IJ line in stable position. Heart size normal. No focal infiltrate. No pleural effusion or pneumothorax. 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 IMPRESSION: 1.  Lines and tubes in stable position. 2. No acute cardiopulmonary disease identified. Chest is stable from prior exam. Electronically Signed   By: Maisie Fushomas  Register   On: 07/16/2019 06:27   Dg Chest Port 1 View  Result Date: 07/15/2019 CLINICAL DATA:  ETOH abuse.  Hypertension. EXAM: PORTABLE CHEST 1 VIEW COMPARISON:  07/14/2019 FINDINGS: Patient is rotated to the right. Endotracheal tube has tip 3 cm above the carina. Right IJ central venous catheter with tip at the cavoatrial junction. Enteric tube courses into the region of the stomach and off the film as tip is not visualized. Lungs are adequately inflated and  otherwise clear. Cardiomediastinal silhouette and remainder of the exam is unchanged. IMPRESSION: No acute cardiopulmonary disease. Tubes and lines as described. Electronically Signed   By: Elberta Fortis M.D.   On: 07/15/2019 07:17   Dg Chest Port 1 View  Result Date: 07/14/2019 CLINICAL DATA:  Respiratory failure. EXAM: PORTABLE CHEST 1 VIEW COMPARISON:  07/13/2019 FINDINGS: Endotracheal tube and right IJ central venous catheter unchanged. Enteric tube has been advanced and courses into the region of the stomach and off the film as tip is not visualized. Lungs are adequately inflated and otherwise clear. Cardiomediastinal silhouette and remainder of the exam is unchanged. IMPRESSION: No acute cardiopulmonary disease. Tubes and lines as described. Electronically Signed   By: Elberta Fortis M.D.   On: 07/14/2019 05:48   Dg Chest Portable 1 View  Result Date: 07/13/2019 CLINICAL DATA:  Intubation EXAM: PORTABLE CHEST 1 VIEW COMPARISON:  July 12, 2019 FINDINGS: New endotracheal tube is approximately 4 cm above the  carina. New right IJ central line tip overlies the cavoatrial junction. Enteric tube passes just below the diaphragm with side port remaining within the distal esophagus. No pneumothorax. Lungs remain clear.  No pleural effusion.  Heart size is stable. IMPRESSION: Lines and tubes as above.  No pneumothorax. Electronically Signed   By: Guadlupe Spanish M.D.   On: 07/13/2019 14:19   Dg Chest Portable 1 View  Result Date: 07/12/2019 CLINICAL DATA:  Evaluate for aspiration pneumonia EXAM: PORTABLE CHEST 1 VIEW COMPARISON:  10/09/2017 FINDINGS: Heart is upper limits normal in size. Lungs clear. No effusions. No acute bony abnormality. IMPRESSION: No active disease. Electronically Signed   By: Charlett Nose M.D.   On: 07/12/2019 20:25   Dg Abd Portable 1 View  Result Date: 07/13/2019 CLINICAL DATA:  OG placement EXAM: PORTABLE ABDOMEN - 1 VIEW COMPARISON:  Chest radiograph 07/13/2019 FINDINGS: Distal tip of a central venous catheter terminates at the level of the right atrium. A transesophageal tube tip and side port are beyond the GE junction terminating near the gastric antrum/duodenal bulb. Additional monitoring leads overlie the chest. Lung bases are clear. Included cardiomediastinal contours are unremarkable. Bowel gas pattern is nonobstructive. Prior lumbar spinal fusion hardware is noted. No suspicious calcifications. IMPRESSION: 1. Distal tip of a central venous catheter terminates at the level of the right atrium. 2. Transesophageal tube tip and side port are beyond the GE, terminating near the gastric antrum/duodenal bulb. Electronically Signed   By: Kreg Shropshire M.D.   On: 07/13/2019 16:19   US Abdomen Limited Ruq  Result Date: 07/14/2019 CLINICAL DATA:  Altered mental status, ETOH EXAM: ULTRASOUND ABDOMEN LIMITED RIGHT UPPER QUADRANT COMPARISON:  CT abdomen/pelvis dated 10/10/2017 FINDINGS: Gallbladder: Gallbladder sludge. No gallbladder wall thickening or pericholecystic fluid. Common bile duct:  Diameter: 2 mm Liver: Hyperechoic hepatic parenchyma, suggesting hepatic steatosis. No focal hepatic lesion is visualized. Portal vein is patent on color Doppler imaging with normal direction of blood flow towards the liver. Other: None. IMPRESSION: Gallbladder sludge, without associated sonographic findings to suggest acute cholecystitis. Hepatic steatosis. Electronically Signed   By: Charline Bills M.D.   On: 07/14/2019 14:51       Discharge Exam: Vitals:   07/25/19 2025 07/26/19 0523  BP: (!) 118/51 (!) 118/99  Pulse: 88 95  Resp: 20 18  Temp: 98.2 F (36.8 C) 97.7 F (36.5 C)  SpO2: 100% 99%    GENERAL: No acute distress.  Appears well.  HEENT: MMM.  Vision and hearing grossly intact.  NECK:  Supple.  No apparent JVD.  RESP:  No IWOB. Good air movement bilaterally. CVS: Irregular rhythm.  Normal rate. Heart sounds normal.  ABD/GI/GU: Bowel sounds present. Soft. Non tender.  MSK/EXT:  Moves extremities. No apparent deformity or edema.  SKIN: Stage I-II right buttock and ischial wound as above. NEURO: Awake, alert and oriented self, month, year and family names.  Some tremors.  No apparent gross neuro deficit. PSYCH: Calm. Normal affect.   The results of significant diagnostics from this hospitalization (including imaging, microbiology, ancillary and laboratory) are listed below for reference.     Microbiology: Recent Results (from the past 240 hour(s))  SARS CORONAVIRUS 2 (TAT 6-24 HRS) Nasopharyngeal Nasopharyngeal Swab     Status: None   Collection Time: 07/25/19  8:33 AM   Specimen: Nasopharyngeal Swab  Result Value Ref Range Status   SARS Coronavirus 2 NEGATIVE NEGATIVE Final    Comment: (NOTE) SARS-CoV-2 target nucleic acids are NOT DETECTED. The SARS-CoV-2 RNA is generally detectable in upper and lower respiratory specimens during the acute phase of infection. Negative results do not preclude SARS-CoV-2 infection, do not rule out co-infections with other  pathogens, and should not be used as the sole basis for treatment or other patient management decisions. Negative results must be combined with clinical observations, patient history, and epidemiological information. The expected result is Negative. Fact Sheet for Patients: HairSlick.no Fact Sheet for Healthcare Providers: quierodirigir.com This test is not yet approved or cleared by the [place] FDA and  has been authorized for detection and/or diagnosis of SARS-CoV-2 by FDA under an Emergency Use Authorization (EUA). This EUA will remain  in effect (meaning this test can be used) for the duration of the COVID-19 declaration under Section 56 4(b)(1) of the Act, 21 U.S.C. section 360bbb-3(b)(1), unless the authorization is terminated or revoked sooner. Performed at [facility], [address] [place], [place] [address]      Labs: BNP (last 3 results) Recent Labs    07/21/19 1013  BNP 218.5*   Basic Metabolic Panel: Recent Labs  Lab 07/22/19 0507 07/23/19 0400 07/24/19 0548 07/25/19 0507 07/26/19 0515  NA 138 140 141 138 138  K 3.9 3.7 3.6 3.8 3.4*  CL 109 109 107 105 102  CO2 24 25 28 26 27   GLUCOSE 143* 132* 101* 104* 102*  BUN 8 6* 6* 7* 7*  CREATININE 0.55 0.58 0.58 0.56 0.64  CALCIUM 9.1 8.7* 9.1 8.9 8.8*  MG 1.7 1.5* 1.5* 1.9 1.8  PHOS 3.6 3.3 3.3 3.2 3.4   Liver Function Tests: Recent Labs  Lab 07/21/19 0822  ALBUMIN 2.1*   No results for input(s): LIPASE, AMYLASE in the last 168 hours. No results for input(s): AMMONIA in the last 168 hours. CBC: Recent Labs  Lab 07/20/19 0431 07/21/19 0822 07/22/19 0507 07/23/19 0400 07/24/19 0548 07/25/19 0507  WBC 6.1 6.0 4.0 2.9* 5.2  --   HGB 9.2* 9.9* 9.2* 8.3* 9.6* 9.6*  HCT 28.5* 32.5* 29.4* 27.1* 30.2* 30.7*  MCV 109.6* 113.2* 109.3* 111.1* 109.0*  --   PLT 138* 170 193 202 249  --    Cardiac Enzymes: Recent Labs  Lab  07/21/19 0822  CKTOTAL 17*   BNP: Invalid input(s): POCBNP CBG: Recent Labs  Lab 07/24/19 1636 07/24/19 1952 07/24/19 2348 07/25/19 0402 07/25/19 0730  GLUCAP 103* 119* 114* 101* 89   D-Dimer No results for input(s): DDIMER in the last 72 hours. Hgb A1c No results for input(s): HGBA1C in the  last 72 hours. Lipid Profile No results for input(s): CHOL, HDL, LDLCALC, TRIG, CHOLHDL, LDLDIRECT in the last 72 hours. Thyroid function studies No results for input(s): TSH, T4TOTAL, T3FREE, THYROIDAB in the last 72 hours.  Invalid input(s): FREET3 Anemia work up No results for input(s): VITAMINB12, FOLATE, FERRITIN, TIBC, IRON, RETICCTPCT in the last 72 hours. Urinalysis    Component Value Date/Time   COLORURINE AMBER (A) 07/12/2019 2200   APPEARANCEUR TURBID (A) 07/12/2019 2200   LABSPEC 1.025 07/12/2019 2200   PHURINE 5.0 07/12/2019 2200   GLUCOSEU 50 (A) 07/12/2019 2200   HGBUR NEGATIVE 07/12/2019 2200   BILIRUBINUR SMALL (A) 07/12/2019 2200   KETONESUR NEGATIVE 07/12/2019 2200   PROTEINUR 30 (A) 07/12/2019 2200   NITRITE NEGATIVE 07/12/2019 2200   LEUKOCYTESUR NEGATIVE 07/12/2019 2200   Sepsis Labs Invalid input(s): PROCALCITONIN,  WBC,  LACTICIDVEN   Time coordinating discharge: 45 minutes  SIGNED:  Almon Hercules, MD  [facility] 07/26/2019, 6:29 AM  If 7PM-7AM, please contact night-coverage [email or web address] Password [email or web address]

## 2019-07-26 NOTE — Progress Notes (Signed)
Patient discharging to [facility].  Report called to [facility] at receiving facility.  All questions answered to satisfaction, awaiting arrival of [facility] for transport.  AVS placed in packet.  Patient in NAD at this time

## 2019-09-22 ENCOUNTER — Emergency Department: Payer: No Typology Code available for payment source

## 2019-09-22 ENCOUNTER — Other Ambulatory Visit: Payer: Self-pay

## 2019-09-22 ENCOUNTER — Emergency Department
Admission: EM | Admit: 2019-09-22 | Discharge: 2019-09-23 | Disposition: A | Discharge status: Home / Self Care | Payer: No Typology Code available for payment source | Attending: Emergency Medicine | Admitting: Emergency Medicine

## 2019-09-22 DIAGNOSIS — W19XXXA Unspecified fall, initial encounter: Secondary | ICD-10-CM | POA: Diagnosis not present

## 2019-09-22 DIAGNOSIS — J1282 Pneumonia due to coronavirus disease 2019: Secondary | ICD-10-CM | POA: Insufficient documentation

## 2019-09-22 DIAGNOSIS — E860 Dehydration: Secondary | ICD-10-CM | POA: Insufficient documentation

## 2019-09-22 DIAGNOSIS — Z7901 Long term (current) use of anticoagulants: Secondary | ICD-10-CM | POA: Diagnosis not present

## 2019-09-22 DIAGNOSIS — S3992XA Unspecified injury of lower back, initial encounter: Secondary | ICD-10-CM | POA: Diagnosis present

## 2019-09-22 DIAGNOSIS — Y998 Other external cause status: Secondary | ICD-10-CM | POA: Diagnosis not present

## 2019-09-22 DIAGNOSIS — Z87891 Personal history of nicotine dependence: Secondary | ICD-10-CM | POA: Insufficient documentation

## 2019-09-22 DIAGNOSIS — Y9389 Activity, other specified: Secondary | ICD-10-CM | POA: Insufficient documentation

## 2019-09-22 DIAGNOSIS — S32020A Wedge compression fracture of second lumbar vertebra, initial encounter for closed fracture: Secondary | ICD-10-CM | POA: Insufficient documentation

## 2019-09-22 DIAGNOSIS — S0990XA Unspecified injury of head, initial encounter: Secondary | ICD-10-CM | POA: Insufficient documentation

## 2019-09-22 DIAGNOSIS — U071 COVID-19: Secondary | ICD-10-CM | POA: Diagnosis not present

## 2019-09-22 DIAGNOSIS — Z79899 Other long term (current) drug therapy: Secondary | ICD-10-CM | POA: Diagnosis not present

## 2019-09-22 DIAGNOSIS — Y92122 Bedroom in nursing home as the place of occurrence of the external cause: Secondary | ICD-10-CM | POA: Diagnosis not present

## 2019-09-22 DIAGNOSIS — E876 Hypokalemia: Secondary | ICD-10-CM | POA: Diagnosis not present

## 2019-09-22 LAB — BASIC METABOLIC PANEL
Anion gap: 8 (ref 5–15)
BUN: 21 mg/dL (ref 8–23)
CO2: 31 mmol/L (ref 22–32)
Calcium: 8.6 mg/dL — ABNORMAL LOW (ref 8.9–10.3)
Chloride: 100 mmol/L (ref 98–111)
Creatinine, Ser: 1.08 mg/dL — ABNORMAL HIGH (ref 0.44–1.00)
GFR calc Af Amer: >60 mL/min (ref >60)
GFR calc non Af Amer: 53 mL/min — ABNORMAL LOW (ref >60)
Glucose, Bld: 84 mg/dL (ref 70–99)
Potassium: 2.5 mmol/L — CL (ref 3.5–5.1)
Sodium: 139 mmol/L (ref 135–145)

## 2019-09-22 LAB — CBC WITH DIFFERENTIAL/PLATELET
Abs Immature Granulocytes: 0.02 10*3/uL (ref 0.00–0.07)
Basophils Absolute: 0 10*3/uL (ref 0.0–0.1)
Basophils Relative: 0 %
Eosinophils Absolute: 0 10*3/uL (ref 0.0–0.5)
Eosinophils Relative: 1 %
HCT: 40.5 % (ref 36.0–46.0)
Hemoglobin: 13.8 g/dL (ref 12.0–15.0)
Immature Granulocytes: 1 %
Lymphocytes Relative: 23 %
Lymphs Abs: 0.6 10*3/uL — ABNORMAL LOW (ref 0.7–4.0)
MCH: 30.7 pg (ref 26.0–34.0)
MCHC: 34.1 g/dL (ref 30.0–36.0)
MCV: 90 fL (ref 80.0–100.0)
Monocytes Absolute: 0.6 10*3/uL (ref 0.1–1.0)
Monocytes Relative: 20 %
Neutro Abs: 1.5 10*3/uL — ABNORMAL LOW (ref 1.7–7.7)
Neutrophils Relative %: 55 %
Platelets: 180 10*3/uL (ref 150–400)
RBC: 4.5 MIL/uL (ref 3.87–5.11)
RDW: 13 % (ref 11.5–15.5)
WBC: 2.8 10*3/uL — ABNORMAL LOW (ref 4.0–10.5)
nRBC: 0 % (ref 0.0–0.2)

## 2019-09-22 MED ORDER — POTASSIUM CHLORIDE IN NACL 20-0.9 MEQ/L-% IV SOLN
Freq: Once | INTRAVENOUS | Status: AC
  Filled 2019-09-22: qty 1000

## 2019-09-22 MED ORDER — POTASSIUM CHLORIDE CRYS ER 20 MEQ PO TBCR
40.0000 meq | EXTENDED_RELEASE_TABLET | Freq: Once | ORAL | Status: AC
  Administered 2019-09-23: 01:00:00 40 meq via ORAL
  Filled 2019-09-22: qty 2

## 2019-09-22 MED ORDER — POTASSIUM CHLORIDE ER 10 MEQ PO TBCR: 10.0000 meq | EXTENDED_RELEASE_TABLET | Freq: Two times a day (BID) | ORAL | 0 refills | Status: AC

## 2019-09-22 MED ORDER — POTASSIUM CHLORIDE 10 MEQ/100ML IV SOLN
10.0000 meq | Freq: Once | INTRAVENOUS | Status: DC
  Filled 2019-09-22: qty 100

## 2019-09-22 NOTE — ED Notes (Signed)
Pt sitting in bed showing no signs of acute distress. VS WNL at this time. Pt able to respond appropriately to questions asked. Will continue to monitor

## 2019-09-22 NOTE — ED Notes (Addendum)
Pt taken for another scan in CT

## 2019-09-22 NOTE — ED Notes (Signed)
Pt taken to CT.

## 2019-09-22 NOTE — ED Provider Notes (Signed)
[facility] Emergency Department Provider Note ____________________________________________   First MD Initiated Contact with Patient 09/22/19 2008     (approximate)  I have reviewed the triage vital signs and the nursing notes.   HISTORY  Chief Complaint Fall  Level 5 caveat: History of present illness limited due to confusion  HPI Anna [name] is a 69 y.o. female with PMH as noted below who presents after an unwitnessed fall.  She was found on the floor beside the bed around 5 PM.  Per EMS, the facility staff was unsure how long the patient had been there.  The patient does not remember what happened and does not know why she is here in the hospital today.  She reports some neck and lower back pain but denies other acute complaints.  The patient states that she does feel somewhat depressed, but denies SI.  Past Medical History:  Diagnosis Date  . Asthma   . Hypertension   . Renal disorder     Patient Active Problem List   Diagnosis Date Noted  . Pressure injury of skin 07/18/2019  . Hypernatremia   . Hypothermia   . SIRS (systemic inflammatory response syndrome) (HCC)   . Altered mental status   . Renal insufficiency   . Hypotension   . Acute hypoxemic respiratory failure ([place])   . Acute metabolic encephalopathy 32/20/2542  . Acute encephalopathy 10/10/2017  . Hematemesis 10/10/2017  . Atrial fibrillation ([place]) 10/10/2017    Past Surgical History:  Procedure Laterality Date  . CESAREAN SECTION    . NEPHRECTOMY TRANSPLANTED ORGAN      Prior to Admission medications   Medication Sig Start Date End Date Taking? Authorizing Provider  acetaminophen (TYLENOL) 500 MG tablet Take 1 tablet (500 mg total) by mouth every 8 (eight) hours. 07/25/19   Mercy Riding, MD  Albuterol Sulfate 108 (90 Base) MCG/ACT AEPB Inhale 2 puffs into the lungs 4 (four) times daily as needed (wheezing/shortness of breath).     [provider]  apixaban  (ELIQUIS) 5 MG TABS tablet Take 1 tablet (5 mg total) by mouth 2 (two) times daily. 07/26/19   Mercy Riding, MD  buPROPion (WELLBUTRIN XL) 300 MG 24 hr tablet Take 300 mg by mouth daily.    [provider]  carboxymethylcellulose (REFRESH PLUS) 0.5 % SOLN Place 1 drop into both eyes 3 (three) times daily as needed (dry eyes).    [provider]  cholecalciferol (VITAMIN D) 1000 units tablet Take 1,000 Units by mouth daily.    [provider]  feeding supplement, ENSURE ENLIVE, (ENSURE ENLIVE) LIQD Take 237 mLs by mouth 2 (two) times daily between meals. 07/26/19   Mercy Riding, MD  fluticasone (FLONASE) 50 MCG/ACT nasal spray Place 1 spray into both nostrils daily as needed for allergies or rhinitis.     [provider]  folic acid (FOLVITE) 1 MG tablet Take 1 tablet (1 mg total) by mouth daily. 07/26/19   Mercy Riding, MD  furosemide (LASIX) 20 MG tablet Take 1 tablet (20 mg total) by mouth daily. 07/26/19   Mercy Riding, MD  gabapentin (NEURONTIN) 100 MG capsule Take 100 mg by mouth 3 (three) times daily.    [provider]  Hydrocortisone (GERHARDT'S BUTT CREAM) CREA Apply 1 application topically 3 (three) times daily. 07/26/19   Mercy Riding, MD  ipratropium (ATROVENT) 0.03 % nasal spray Place 2 sprays into both nostrils 3 (three) times daily as needed  for rhinitis.    [provider]  Melatonin 5 MG TABS Take 5 mg by mouth at bedtime as needed (sleep).    [provider]  metoprolol tartrate (LOPRESSOR) 25 MG tablet Take 1 tablet (25 mg total) by mouth 2 (two) times daily as needed (HR > 110 for over 10 minutes or SBP > 150). 07/25/19 07/24/20  Almon Hercules, MD  montelukast (SINGULAIR) 10 MG tablet Take 10 mg by mouth at bedtime.    [provider]  mycophenolate (CELLCEPT) 500 MG tablet Take 500 mg by mouth 2 (two) times daily.    [provider]  pantoprazole (PROTONIX) 40 MG tablet Take 1 tablet (40 mg  total) by mouth daily. 07/26/19   Almon Hercules, MD  potassium chloride (KLOR-CON) 10 MEQ tablet Take 1 tablet (10 mEq total) by mouth 2 (two) times daily for 7 days. 09/22/19 09/29/19  Dionne Bucy, MD  tacrolimus (PROGRAF) 1 MG capsule Take 1-2 mg by mouth See admin instructions. Take 2 capsules (2 mg) by mouth every morning and 1 capsule (1 mg) every evening    [provider]  thiamine 100 MG tablet Take 1 tablet (100 mg total) by mouth daily. 07/26/19   Almon Hercules, MD  traZODone (DESYREL) 100 MG tablet Take 1 tablet (100 mg total) by mouth at bedtime. 07/26/19   Almon Hercules, MD    Allergies Iodine-131, Nsaids, and Penicillin g  History reviewed. No pertinent family history.  Social History Social History   Tobacco Use  . Smoking status: Never Smoker  . Smokeless tobacco: Former Engineer, water Use Topics  . Alcohol use: Yes  . Drug use: Yes    Comment: heroin    Review of Systems Level 5 caveat: Unable obtain review of systems due to confusion    ____________________________________________   PHYSICAL EXAM:  VITAL SIGNS: ED Triage Vitals  Enc Vitals Group     BP 09/22/19 1911 (!) 125/112     Pulse Rate 09/22/19 1911 86     Resp 09/22/19 1911 17     Temp 09/22/19 1911 98.2 F (36.8 C)     Temp Source 09/22/19 1911 Oral     SpO2 09/22/19 1911 99 %     Weight 09/22/19 1909 180 lb (81.6 kg)     Height 09/22/19 1909 5\' 6"  (1.676 m)     Head Circumference --      Peak Flow --      Pain Score 09/22/19 1950 0     Pain Loc --      Pain Edu? --      Excl. in GC? --     Constitutional: Alert, oriented x2 but somewhat delayed in responding to questions..  Relatively well appearing and in no acute distress. Eyes: Conjunctivae are normal.  EOMI.  PERRLA. Head: Atraumatic. Nose: No congestion/rhinnorhea. Mouth/Throat: Mucous membranes are moist.   Neck: Normal range of motion.  Mild midline cervical spinal tenderness with no step-off or crepitus.  Cardiovascular: Normal rate, regular rhythm.Good peripheral circulation. Respiratory: Normal respiratory effort.  No retractions.  Gastrointestinal: Soft and nontender. No distention.  Genitourinary: No flank tenderness. Musculoskeletal: No lower extremity edema.  Full range of motion to all extremities.  Mild lumbar midline spinal tenderness. Neurologic:  Normal speech.  Motor intact in all extremities.  Alert and answering questions, but with poor attention. Skin:  Skin is warm and dry. No rash noted. Psychiatric: Calm and cooperative.  ____________________________________________   09/24/19 (  all labs ordered are listed, but only abnormal results are displayed)  Labs Reviewed  BASIC METABOLIC PANEL - Abnormal; Notable for the following components:      Result Value   Potassium 2.5 (*)    Creatinine, Ser 1.08 (*)    Calcium 8.6 (*)    GFR calc non Af Amer 53 (*)    All other components within normal limits  CBC WITH DIFFERENTIAL/PLATELET - Abnormal; Notable for the following components:   WBC 2.8 (*)    Neutro Abs 1.5 (*)    Lymphs Abs 0.6 (*)    All other components within normal limits  URINALYSIS, COMPLETE (UACMP) WITH MICROSCOPIC   ____________________________________________  EKG  ED ECG REPORT I, Dionne Bucy, the attending physician, personally viewed and interpreted this ECG.  Date: 09/22/2019 EKG Time: 2106 Rate: 85 Rhythm: Atrial fibrillation QRS Axis: normal Intervals: normal ST/T Wave abnormalities: normal Narrative Interpretation: no evidence of acute ischemia  ____________________________________________  RADIOLOGY  CT head: No ICH or other acute abnormality CT cervical spine: No acute fracture CT thoracic spine: No acute fracture CT lumbar spine: L2 compression fracture with no retropulsion  ____________________________________________   PROCEDURES  Procedure(s) performed: No  Procedures  Critical Care performed: No  ____________________________________________   INITIAL IMPRESSION / ASSESSMENT AND PLAN / ED COURSE  Pertinent labs & imaging results that were available during my care of the patient were reviewed by me and considered in my medical decision making (see chart for details).  69 year old female with PMH as noted above presents for evaluation after an unwitnessed fall.  The patient does not remember anything about what happened.  I reviewed the past medical records in epic.  The patient was admitted 2 months ago for altered mental status and encephalopathy after being found down at home.  At that time she was obtunded and required intubation.  At the time of discharge she was much more alert although the discharge summary notes that there was still some intermittent delirium.  On exam today, the patient is alert and answering questions appropriately but does appear a bit confused.  She is slow to answer certain questions, and has poor attention, often forgetting the question she was trying to answer.  Her neurologic exam is otherwise nonfocal.  Her vital signs are normal.  She does have some midline tenderness in the cervical and lumbar spine but no step-off or crepitus, and no other evidence of trauma.  The patient's mental status seems to be relatively consistent with her recent baseline.  CT head was obtained after triage and is negative, as is an EKG.  I will add on CT of the spine, as well as basic labs and a urinalysis.  If this work-up is negative I anticipate that the patient can be discharged back to her facility.  ----------------------------------------- 11:25 PM on 09/22/2019 -----------------------------------------  CT shows an L2 compression fracture with no retropulsion.  The patient's neurologic exam is normal, so she will not need any specific intervention for this.  The CT also shows a thoracic aortic aneurysm which will require follow-up.  The patient has no acute chest pain  or other symptoms related to this.  In addition, the CT shows possible airspace opacities in the lung.  The patient has no cough, fever, or other symptoms to suggest pneumonia or COVID-19.  We will obtain a chest x-ray to further evaluate.  Lab work-up reveals hypokalemia, which we will replete.  The patient is pending a urinalysis.  I signed her  out to the oncoming physician Dr. Don Perking. ____________________________________________   FINAL CLINICAL IMPRESSION(S) / ED DIAGNOSES  Final diagnoses:  Fall, initial encounter  Closed compression fracture of L2 vertebra, initial encounter (HCC)  Hypokalemia      NEW MEDICATIONS STARTED DURING THIS VISIT:  New Prescriptions   POTASSIUM CHLORIDE (KLOR-CON) 10 MEQ TABLET    Take 1 tablet (10 mEq total) by mouth 2 (two) times daily for 7 days.     Note:  This document was prepared using Dragon voice recognition software and may include unintentional dictation errors.    Dionne Bucy, MD 09/22/19 2326

## 2019-09-22 NOTE — ED Notes (Signed)
Pt taken to Xray.

## 2019-09-22 NOTE — ED Notes (Signed)
Yellow arm band and DNR arm band applied to left wrist. Fall alarm and yellow socks applied to pt.

## 2019-09-22 NOTE — ED Notes (Signed)
Date and time results received: 09/22/19 11:11 PM   Test: Potassium  Critical Value: 2.5  Name of Provider Notified: Dr. Marisa Severin

## 2019-09-22 NOTE — Discharge Instructions (Addendum)
CT scan shows a compression fracture at the L2 vertebrae.  This is normally treated with pain management and physical therapy.  The CT also shows a 4.8 cm aneurysm (dilation) of the thoracic aorta.  This should be followed with a repeat scan after 6 months.  The potassium level in the blood is low.  We have prescribed oral potassium supplementation for the next week.  Anna [name] should follow-up with her regular doctor for these issues.  She should return to the emergency department for new or worsening pain, numbness or weakness, change in mental status, or any other new or worsening symptoms that concern her or facility staff.

## 2019-09-22 NOTE — ED Triage Notes (Signed)
Pt arrives to ED via [facility] from [facility] for an unwitnessed fall. Was found on floor bedside bed at 5pm. Unsure how long pt was on floor. Takes eliquis. Doesn't walk well per EMS. Per EMS facility states pt normally talks without prompting but seems to respond when talked to. Pt arrives with DNR form. Only c/o head pain.

## 2019-09-22 NOTE — ED Notes (Signed)
Pt back from CT

## 2019-09-22 NOTE — ED Notes (Signed)
Blood work was sent to the lab and an external catheter was placed onto pt. Pt denies any pain at this time and has no needs or concerns for staff

## 2019-09-23 LAB — URINALYSIS, COMPLETE (UACMP) WITH MICROSCOPIC
Bilirubin Urine: NEGATIVE
Glucose, UA: NEGATIVE mg/dL
Hgb urine dipstick: NEGATIVE
Ketones, ur: NEGATIVE mg/dL
Nitrite: NEGATIVE
Protein, ur: NEGATIVE mg/dL
Specific Gravity, Urine: 1.019 (ref 1.005–1.030)
pH: 5 (ref 5.0–8.0)

## 2019-09-23 LAB — POC SARS CORONAVIRUS 2 AG: SARS Coronavirus 2 Ag: POSITIVE — AB

## 2019-09-23 LAB — MAGNESIUM: Magnesium: 1.3 mg/dL — ABNORMAL LOW (ref 1.7–2.4)

## 2019-09-23 MED ORDER — MAGNESIUM SULFATE 2 GM/50ML IV SOLN
2.0000 g | Freq: Once | INTRAVENOUS | Status: AC
  Administered 2019-09-23: 01:00:00 2 g via INTRAVENOUS
  Filled 2019-09-23: qty 50

## 2019-09-23 MED ORDER — LIDOCAINE 5 % EX PTCH
1.0000 | MEDICATED_PATCH | CUTANEOUS | Status: DC
  Administered 2019-09-23: 03:00:00 1 via TRANSDERMAL
  Filled 2019-09-23: qty 1

## 2019-09-23 MED ORDER — ACETAMINOPHEN 500 MG PO TABS
1000.0000 mg | ORAL_TABLET | Freq: Once | ORAL | Status: DC
  Filled 2019-09-23: qty 2

## 2019-09-23 NOTE — ED Notes (Signed)
SBAR style report was given to RN Lorie Apley at [facility]. RN verbalized an understanding of the pts current condition and status in regards to pt's abnormal labs. Aram Beecham RN had no further questions for this RN. Pt will be transferred back to her facility by EMS transport

## 2019-09-23 NOTE — ED Notes (Signed)
Pt lying in bed asleep (eyes closed with unlabored equal breathing). Pt denies pain at this time as she is asleep. Nothing needed from staff at this time

## 2019-09-23 NOTE — ED Notes (Signed)
Pt waiting on transport back to [facility], per crew chief [place]: "pt will be transported by day shift crew"

## 2019-09-23 NOTE — ED Provider Notes (Signed)
-----------------------------------------   11:26 PM on 09/22/2019 -----------------------------------------   Blood pressure 140/86, pulse 86, temperature 98.2 F (36.8 C), temperature source Oral, resp. rate 15, height 5\' 6"  (1.676 m), weight 81.6 kg, SpO2 100 %.  Assuming care from Dr. of Maite Burlison is a 69 y.o. female with a chief complaint of Fall .    Please refer to H&P by previous MD for further details.  The current plan of care is to f/u labs, imaging.   _________________________ 1:57 AM on 09/23/2019 -----------------------------------------  Patient with hypokalemia and hypomagnesemia which was supplemented PO and IV. No EKG changes. Mild AKI for which she was given 1L NS. Patient tested positive for COVID 10 days ago at her nursing home and today was her last day of quarantine. CXR showing COVID PNA. Patient retested here and remains positive. Nursing home was informed. She has normal WOB, normal sats. Patient with new L2 compression fracture and no retropulsion. Pain well controlled. Patient stable for discharge back to her nursing home with close f/u with PCP/        09/25/2019, MD 09/23/19 [phone]

## 2019-09-23 NOTE — ED Notes (Signed)
Pt brought back from Xray  

## 2022-01-04 IMAGING — CT CT L SPINE W/O CM
3 of 10 series · 12 of 33 positions shown, 13 images · non-contrast
Comparison: CT dated October 09, 2017

CLINICAL DATA: Pain status post fall

EXAM:
CT THORACIC AND LUMBAR SPINE WITHOUT CONTRAST
TECHNIQUE: Multidetector CT imaging of the thoracic and lumbar spine was
performed without contrast. Multiplanar CT image reconstructions
were also generated.

[Series 6: sagittal bone · sagittal · 0.20mm/px · 5 of 70 slices shown]
[im 12/70  bone]
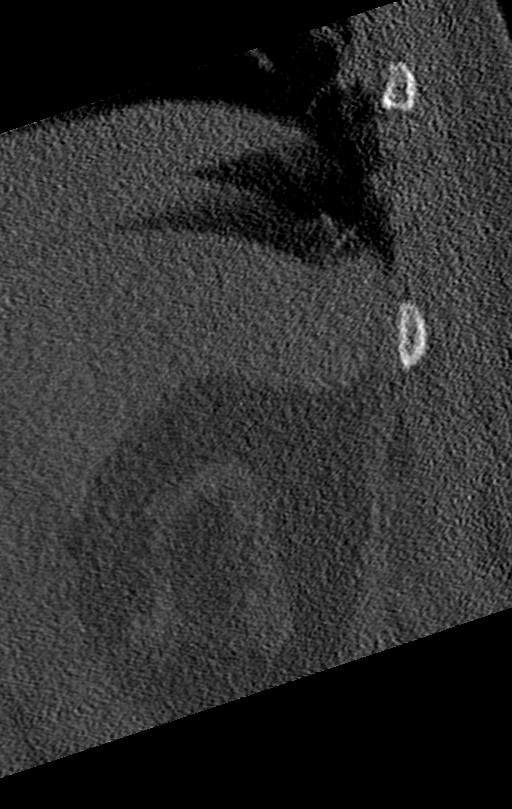
[im 24/70  bone]
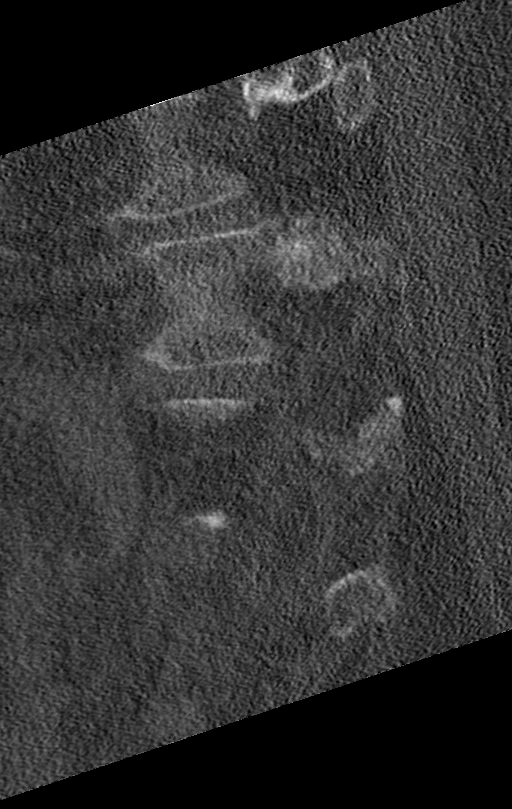
[im 35/70  bone]
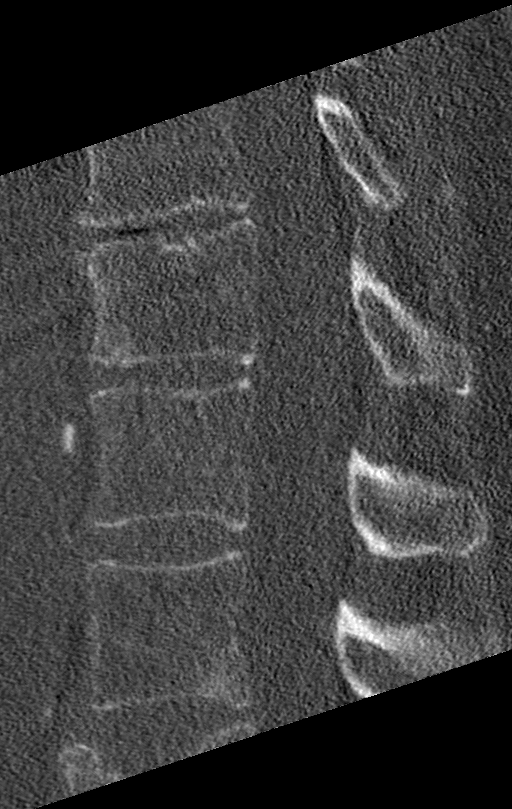
[im 47/70  bone]
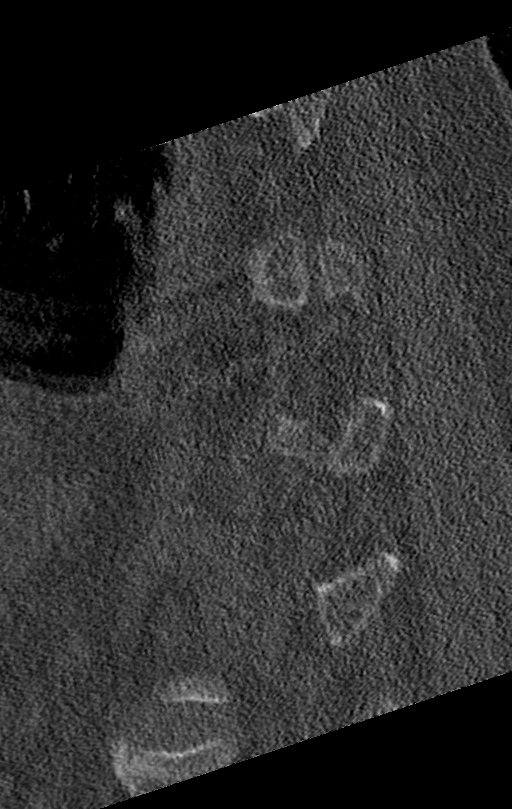
[im 58/70  bone]
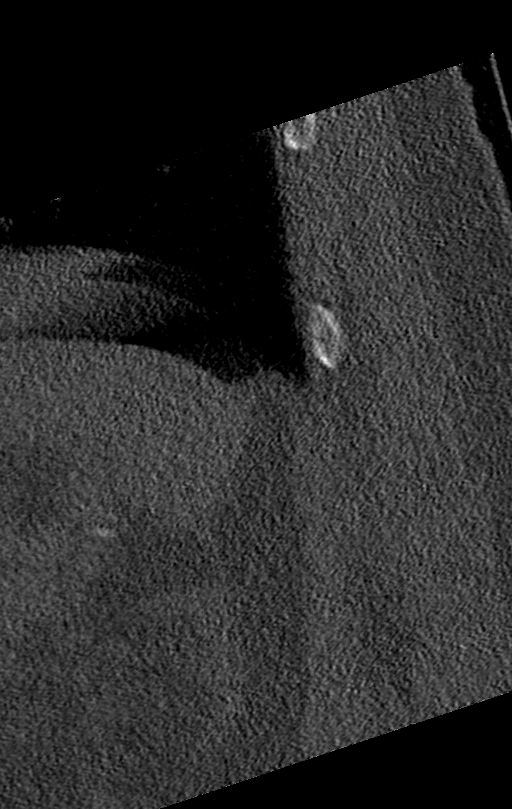

[Series 7: l spine soft · axial · 0.32mm/px · z∈[-410,-294]mm · 4 of 98 slices shown, 5 images]
[im 20/98  soft-tissue]
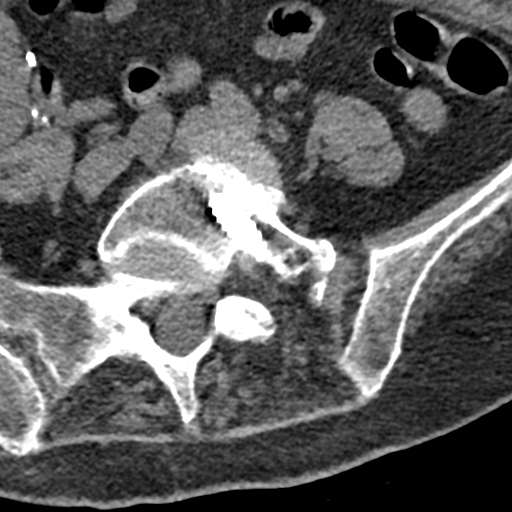
[im 20/98  bone]
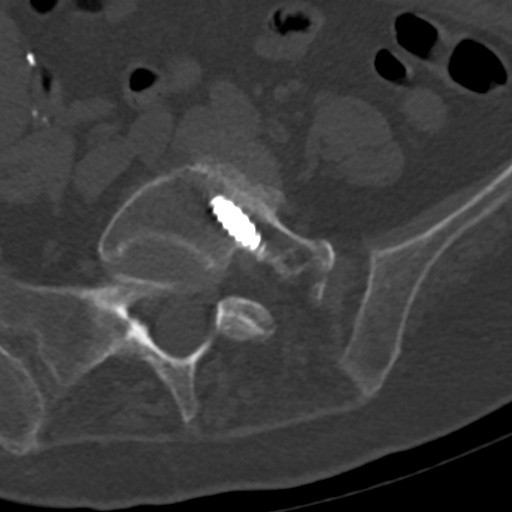
[im 39/98  bone]
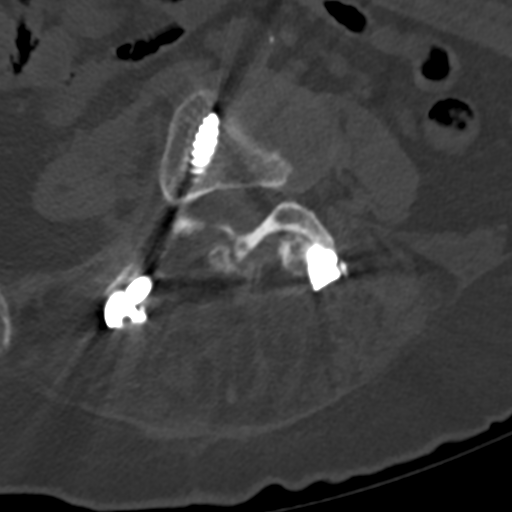
[im 59/98  bone]
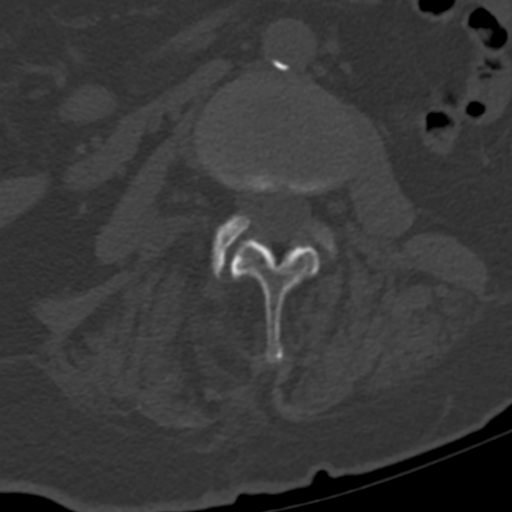
[im 78/98  bone]
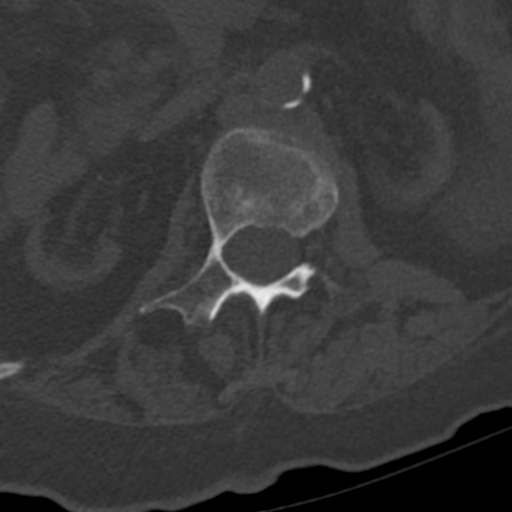

[Series 9: coronal bone · coronal · 0.27mm/px · 3 of 71 slices shown]
[im 18/71  bone]
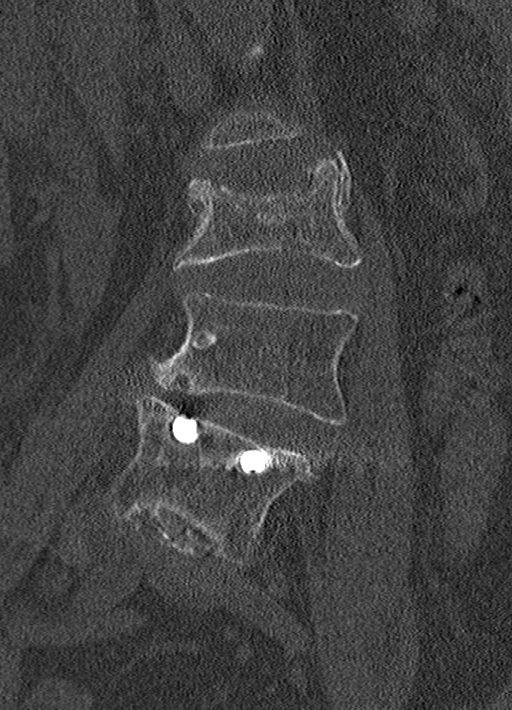
[im 36/71  bone]
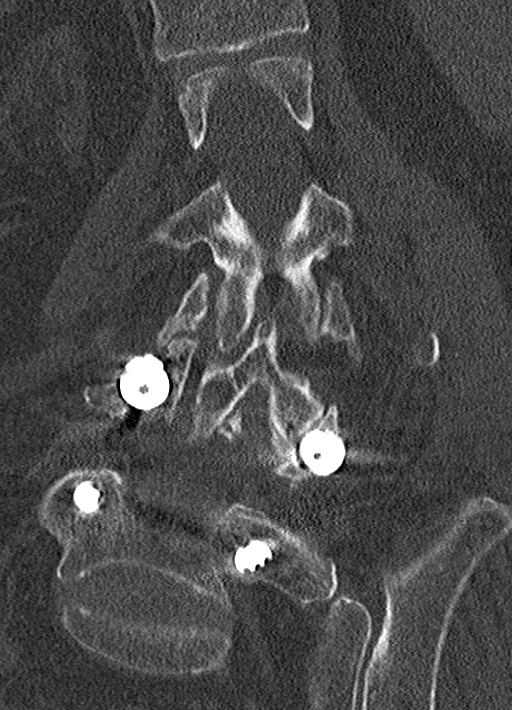
[im 53/71  bone]
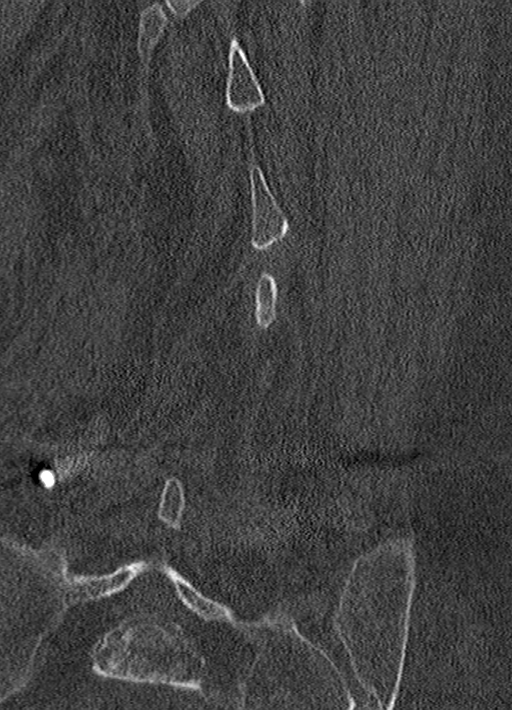

[12 of 33 positions shown; findings below may reference images not displayed]

FINDINGS: CT THORACIC SPINE FINDINGS

Alignment: Normal.

Vertebrae: There is mild height loss of the T6 vertebral body which
is likely chronic.

Paraspinal and other soft tissues: There is a thoracic aortic
aneurysm involving the ascending aorta measuring approximately
cm. Atherosclerotic changes are noted of the thoracic aorta. There
is a 1.3 cm airspace opacity in the right upper lobe. There are
additional scattered small airspace opacities in the partially
visualized right upper lobe with probable mild interlobular septal
thickening. There is some atelectasis versus scarring involving the
right lower lobe. There is a 6 mm pulmonary nodule in the left lower
lobe which is stable from prior CT and is favored to represent a
benign process.

Disc levels: There is mild multilevel disc height loss throughout
the visualized thoracic spine.

CT LUMBAR SPINE FINDINGS

Segmentation: 5 lumbar type vertebrae. There are rudimentary ribs at
the T12 level.

Alignment: There is levoscoliosis of the lumbar spine centered at
the L2 level.

Vertebrae: There is an acute appearing compression fracture of the
L2 vertebral body primarily involving the superior endplate. There
is no significant retropulsion. The patient is status post prior
L4-L5 posterior fusion. There is an interbody spacer at the L4-L5
level. The hardware appears grossly intact. There is a 8-9 mm
anterolisthesis of L4 on L5.

Paraspinal and other soft tissues: The kidneys are atrophic
bilaterally. Atherosclerotic changes are noted of the abdominal
aorta without evidence for an abdominal aortic aneurysm.

Disc levels: There is mild multilevel disc height loss throughout
the lumbar spine.
IMPRESSION: 1. Acute compression fracture of the L2 vertebral body without
significant retropulsion.
2. No acute fracture involving the thoracic spine.
3. Thoracic aortic aneurysm measuring up to 4.8 cm. Ascending
thoracic aortic aneurysm. Recommend semi-annual imaging followup by
CTA or MRA and referral to cardiothoracic surgery if not already
obtained. This recommendation follows 8151
ACCF/AHA/AATS/ACR/ASA/SCA/SAGATA/VC/DON LOLITO/LECHE Guidelines for the
Diagnosis and Management of Patients With Thoracic Aortic Disease.
Circulation. 8151; 121: [id number]. Aortic aneurysm NOS (R27RZ-F9X.Q)
4. Few scattered airspace opacities in the partially visualized
right upper lobe with probable mild interlobular septal thickening.
This is favored to represent an infectious or inflammatory process.
However, a follow-up chest CT in 3 months is recommended to assess
for resolution.
5. Status post prior L4-L5 posterior fusion with an 8-9 mm
anterolisthesis of L4 on L5.
6. Atrophic kidneys consistent with the patient's history of
end-stage renal disease.

Aortic Atherosclerosis (R27RZ-K73.3).

## 2022-01-04 IMAGING — CT CT CERVICAL SPINE W/O CM
2 series · 9 of 27 positions shown, 11 images · non-contrast
Comparison: CT cervical spine 07/12/2019

CLINICAL DATA: Unwitnessed fall. Found on floor. Back pain.

EXAM:
CT CERVICAL SPINE WITHOUT CONTRAST
TECHNIQUE: Multidetector CT imaging of the cervical spine was performed without
intravenous contrast. Multiplanar CT image reconstructions were also
generated.

[Series 3: sagittal bone · sagittal · 0.21mm/px · 5 of 67 slices shown, 6 images]
[im 23/67  bone]
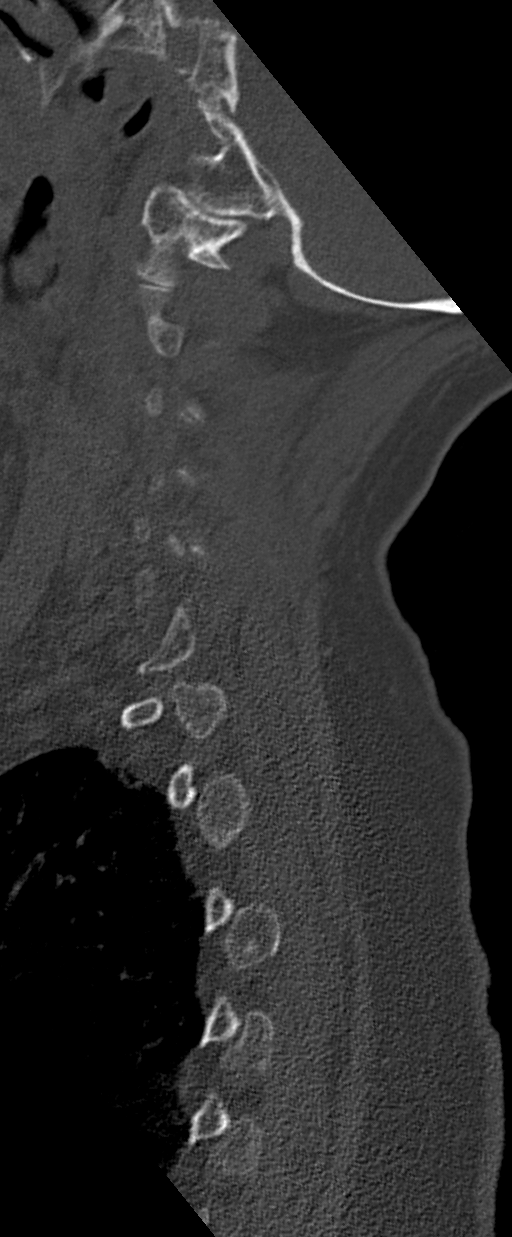
[im 28/67  bone]
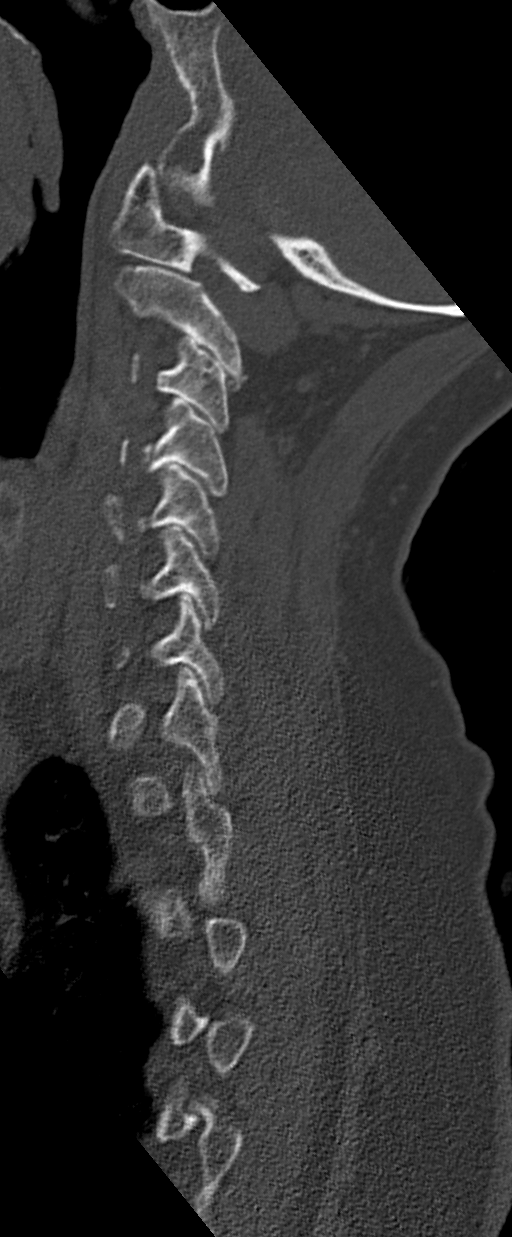
[im 34/67  soft-tissue]
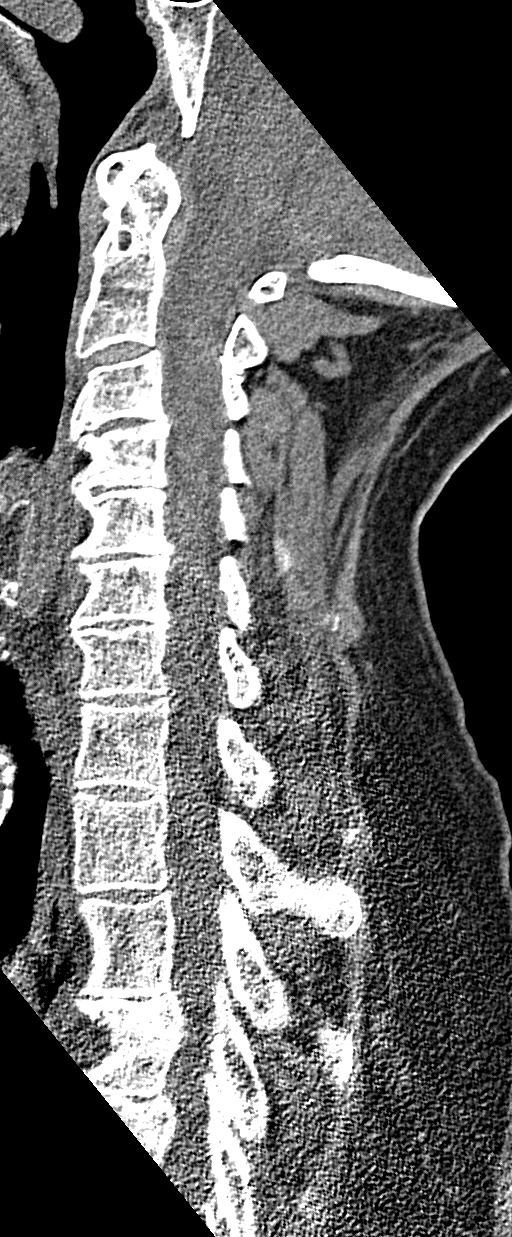
[im 34/67  bone]
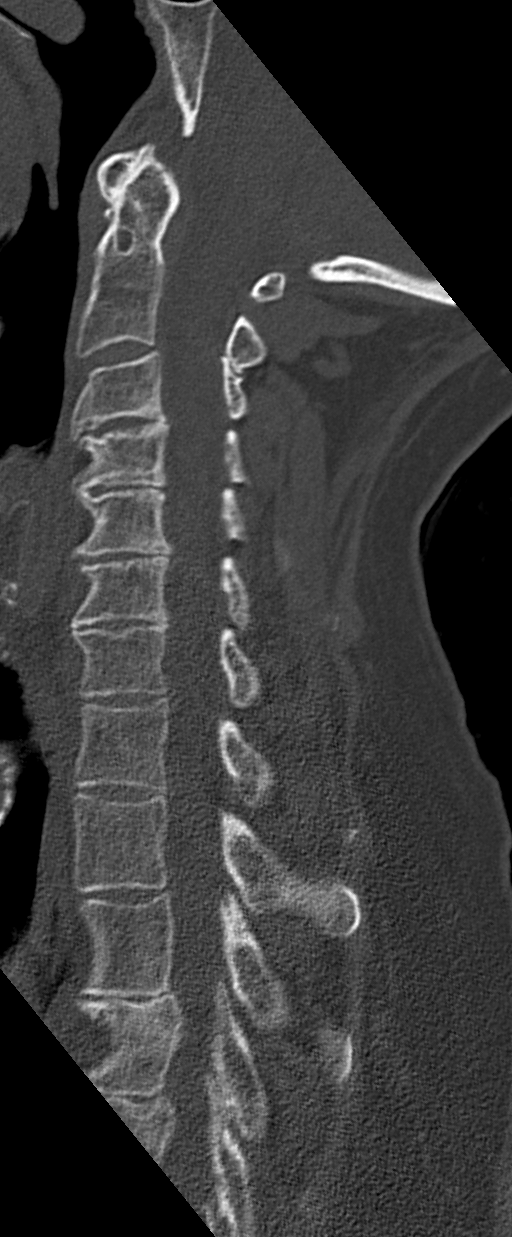
[im 39/67  bone]
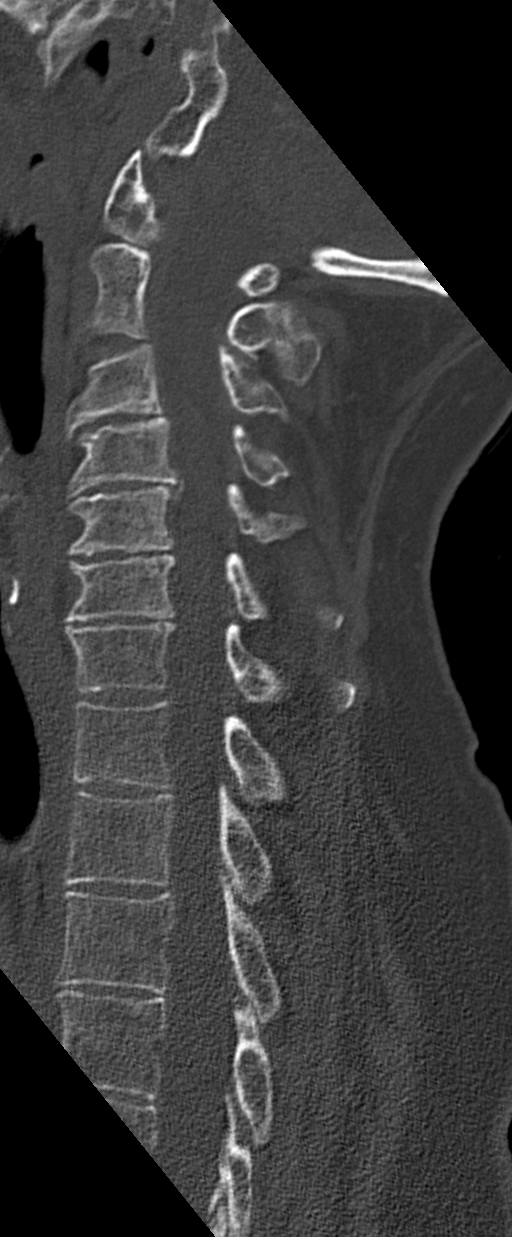
[im 45/67  bone]
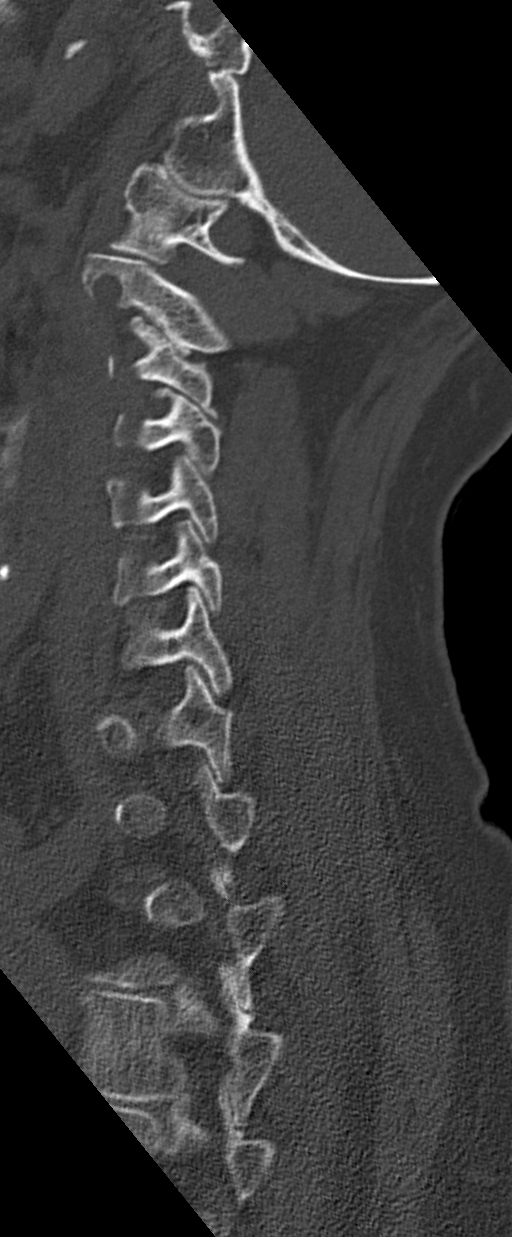

[Series 4: c spine soft · axial · 0.44mm/px · z∈[-50,+60]mm · 4 of 81 slices shown, 5 images]
[im 13/81  soft-tissue]
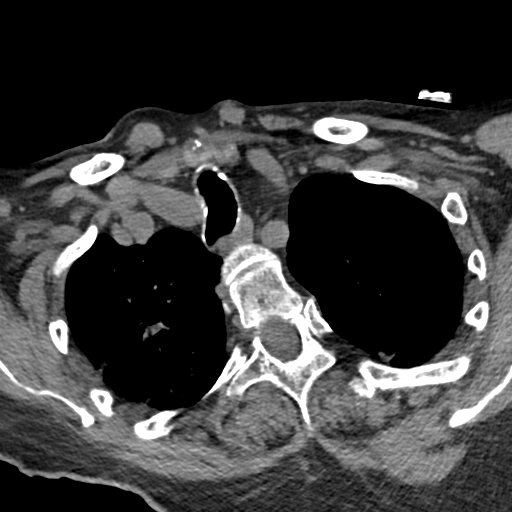
[im 13/81  bone]
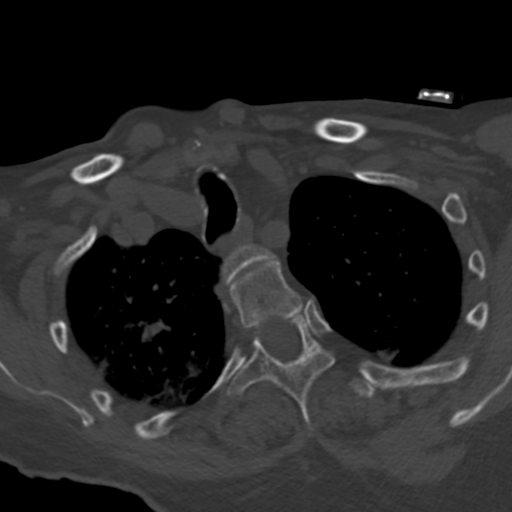
[im 31/81  bone]
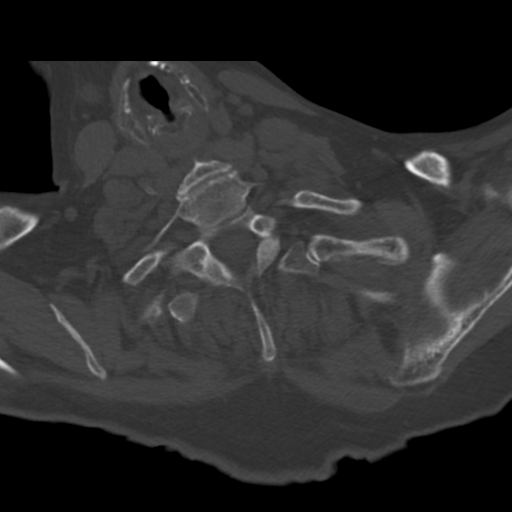
[im 50/81  bone]
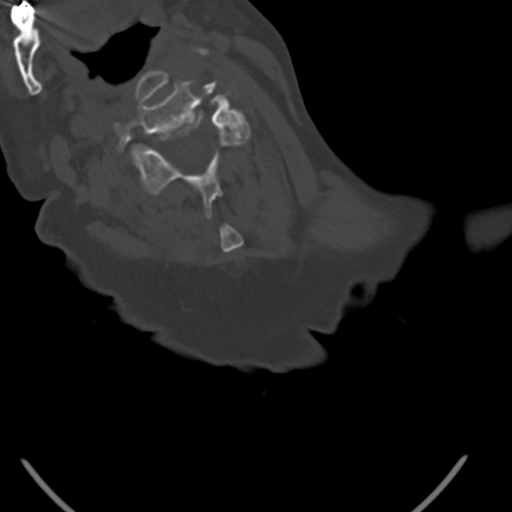
[im 68/81  bone]
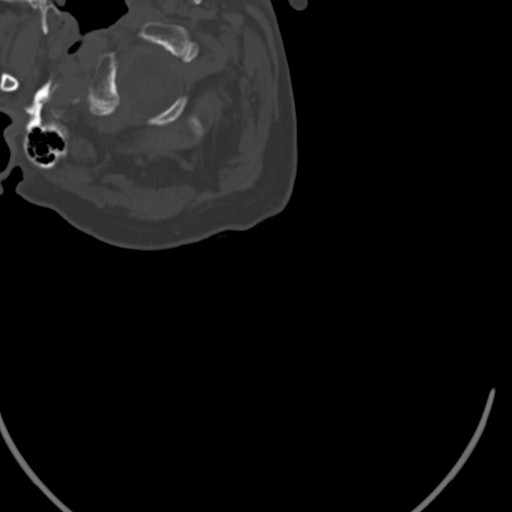

[9 of 27 positions shown; findings below may reference images not displayed]

FINDINGS: Alignment: Patient is tilted in the scanner. Straightening of normal
lordosis. No traumatic subluxation.

Skull base and vertebrae: No acute fracture. Vertebral body heights
are maintained. The dens and skull base are intact.

Soft tissues and spinal canal: No prevertebral fluid or swelling. No
visible canal hematoma.

Disc levels: Disc space narrowing and endplate spurring from C3-C4
through C6-C7. Multilevel facet hypertrophy. No high-grade canal
stenosis.

Upper chest: Patchy opacities in the right upper lobe. Foot more
confluent/nodular opacity in the right upper lobe measuring
approximately 9 mm, series 2, image 71. this is not previously
included in the field of view.

Other: None.
IMPRESSION: 1. Multilevel degenerative disc disease and facet arthropathy. No
acute fracture or traumatic subluxation of the cervical spine.
2. Patchy opacities in the right upper lobe, may be
infectious/inflammatory, less likely scarring. More
confluent/nodular opacity in the right upper lobe measuring 9 mm.
This is not previously included in the field of view. No prior chest
exams available for comparison. In the absence of prior imaging,
recommend chest CT follow-up in 3 months to evaluate for stability
or resolution.
